# Patient Record
Sex: Female | Born: 1975
Health system: Southern US, Community
[De-identification: ages and names within clinical notes are randomized; demographics above are authoritative.]

## PROBLEM LIST (undated history)

## (undated) DIAGNOSIS — J029 Acute pharyngitis, unspecified: Secondary | ICD-10-CM

## (undated) DIAGNOSIS — E119 Type 2 diabetes mellitus without complications: Secondary | ICD-10-CM

## (undated) DIAGNOSIS — K219 Gastro-esophageal reflux disease without esophagitis: Secondary | ICD-10-CM

## (undated) DIAGNOSIS — D649 Anemia, unspecified: Secondary | ICD-10-CM

## (undated) DIAGNOSIS — J45909 Unspecified asthma, uncomplicated: Secondary | ICD-10-CM

## (undated) DIAGNOSIS — E042 Nontoxic multinodular goiter: Secondary | ICD-10-CM

## (undated) DIAGNOSIS — R609 Edema, unspecified: Principal | ICD-10-CM

## (undated) DIAGNOSIS — J309 Allergic rhinitis, unspecified: Secondary | ICD-10-CM

## (undated) DIAGNOSIS — M76899 Other specified enthesopathies of unspecified lower limb, excluding foot: Secondary | ICD-10-CM

## (undated) DIAGNOSIS — J039 Acute tonsillitis, unspecified: Secondary | ICD-10-CM

## (undated) DIAGNOSIS — I10 Essential (primary) hypertension: Secondary | ICD-10-CM

## (undated) DIAGNOSIS — L680 Hirsutism: Secondary | ICD-10-CM

## (undated) DIAGNOSIS — R002 Palpitations: Secondary | ICD-10-CM

## (undated) DIAGNOSIS — F419 Anxiety disorder, unspecified: Secondary | ICD-10-CM

## (undated) HISTORY — DX: Unspecified asthma, uncomplicated: J45.909

## (undated) HISTORY — DX: Edema, unspecified: R60.9

## (undated) HISTORY — DX: Nontoxic multinodular goiter: E04.2

## (undated) HISTORY — DX: Anemia, unspecified: D64.9

## (undated) HISTORY — DX: Anxiety disorder, unspecified: F41.9

## (undated) HISTORY — DX: Type 2 diabetes mellitus without complications: E11.9

## (undated) HISTORY — DX: Palpitations: R00.2

## (undated) HISTORY — DX: Essential (primary) hypertension: I10

## (undated) HISTORY — DX: Other specified enthesopathies of unspecified lower limb, excluding foot: M76.899

## (undated) HISTORY — DX: Acute tonsillitis, unspecified: J03.90

## (undated) HISTORY — DX: Allergic rhinitis, unspecified: J30.9

## (undated) HISTORY — PX: OTHER SURGICAL HISTORY: SHX169

## (undated) HISTORY — DX: Acute pharyngitis, unspecified: J02.9

## (undated) HISTORY — DX: Hirsutism: L68.0

---

## 2003-12-06 ENCOUNTER — Emergency Department (HOSPITAL_COMMUNITY): Admission: EM | Admit: 2003-12-06 | Discharge: 2003-12-06 | Payer: Self-pay | Admitting: Family Medicine

## 2004-05-28 HISTORY — PX: OTHER SURGICAL HISTORY: SHX169

## 2005-06-15 ENCOUNTER — Ambulatory Visit (HOSPITAL_COMMUNITY): Admission: RE | Admit: 2005-06-15 | Discharge: 2005-06-15 | Payer: Self-pay | Admitting: Obstetrics and Gynecology

## 2006-02-22 ENCOUNTER — Inpatient Hospital Stay (HOSPITAL_COMMUNITY): Admission: AD | Admit: 2006-02-22 | Discharge: 2006-02-22 | Payer: Self-pay | Admitting: Obstetrics and Gynecology

## 2006-02-27 ENCOUNTER — Inpatient Hospital Stay (HOSPITAL_COMMUNITY): Admission: AD | Admit: 2006-02-27 | Discharge: 2006-03-02 | Payer: Self-pay | Admitting: *Deleted

## 2006-03-01 ENCOUNTER — Encounter (INDEPENDENT_AMBULATORY_CARE_PROVIDER_SITE_OTHER): Payer: Self-pay | Admitting: Specialist

## 2006-03-05 ENCOUNTER — Inpatient Hospital Stay (HOSPITAL_COMMUNITY): Admission: AD | Admit: 2006-03-05 | Discharge: 2006-03-08 | Payer: Self-pay | Admitting: Obstetrics and Gynecology

## 2006-09-26 LAB — CONVERTED CEMR LAB: Pap Smear: NORMAL

## 2006-12-19 ENCOUNTER — Encounter: Admission: RE | Admit: 2006-12-19 | Discharge: 2006-12-19 | Payer: Self-pay | Admitting: Internal Medicine

## 2007-09-26 LAB — CONVERTED CEMR LAB: Pap Smear: NORMAL

## 2008-05-28 LAB — HM MAMMOGRAPHY

## 2008-06-01 ENCOUNTER — Ambulatory Visit: Payer: Self-pay | Admitting: Internal Medicine

## 2008-06-01 DIAGNOSIS — J45909 Unspecified asthma, uncomplicated: Secondary | ICD-10-CM

## 2008-06-01 DIAGNOSIS — J309 Allergic rhinitis, unspecified: Secondary | ICD-10-CM

## 2008-06-01 DIAGNOSIS — I10 Essential (primary) hypertension: Secondary | ICD-10-CM

## 2008-06-01 HISTORY — DX: Unspecified asthma, uncomplicated: J45.909

## 2008-06-01 HISTORY — DX: Essential (primary) hypertension: I10

## 2008-06-01 HISTORY — DX: Allergic rhinitis, unspecified: J30.9

## 2008-06-01 LAB — CONVERTED CEMR LAB
ALT: 20 units/L (ref 0–35)
AST: 20 units/L (ref 0–37)
Alkaline Phosphatase: 54 units/L (ref 39–117)
BUN: 8 mg/dL (ref 6–23)
Basophils Absolute: 0.2 10*3/uL — ABNORMAL HIGH (ref 0.0–0.1)
Bilirubin Urine: NEGATIVE
Bilirubin, Direct: 0.1 mg/dL (ref 0.0–0.3)
CO2: 28 meq/L (ref 19–32)
Glucose, Bld: 101 mg/dL — ABNORMAL HIGH (ref 70–99)
HDL: 50.9 mg/dL (ref >39.0)
Hemoglobin: 14.4 g/dL (ref 12.0–15.0)
Ketones, ur: NEGATIVE mg/dL
LDL Cholesterol: 97 mg/dL (ref 0–99)
Leukocytes, UA: NEGATIVE
Lymphocytes Relative: 27.2 % (ref 12.0–46.0)
MCHC: 35.7 g/dL (ref 30.0–36.0)
Monocytes Absolute: 0.4 10*3/uL (ref 0.1–1.0)
Neutro Abs: 3.2 10*3/uL (ref 1.4–7.7)
Platelets: 207 10*3/uL (ref 150–400)
Potassium: 4.1 meq/L (ref 3.5–5.1)
RDW: 12 % (ref 11.5–14.6)
Sodium: 139 meq/L (ref 135–145)
Total CHOL/HDL Ratio: 3.4
Triglycerides: 124 mg/dL (ref 0–149)
VLDL: 25 mg/dL (ref 0–40)
pH: 8 (ref 5.0–8.0)

## 2008-06-09 ENCOUNTER — Encounter: Admission: RE | Admit: 2008-06-09 | Discharge: 2008-06-09 | Payer: Self-pay | Admitting: Internal Medicine

## 2008-06-28 ENCOUNTER — Ambulatory Visit: Payer: Self-pay | Admitting: Endocrinology

## 2008-06-28 DIAGNOSIS — E042 Nontoxic multinodular goiter: Secondary | ICD-10-CM

## 2008-06-28 HISTORY — DX: Nontoxic multinodular goiter: E04.2

## 2008-07-21 ENCOUNTER — Ambulatory Visit (HOSPITAL_COMMUNITY): Admission: RE | Admit: 2008-07-21 | Discharge: 2008-07-21 | Payer: Self-pay | Admitting: Obstetrics and Gynecology

## 2008-09-22 ENCOUNTER — Ambulatory Visit: Payer: Self-pay | Admitting: Internal Medicine

## 2008-09-22 LAB — CONVERTED CEMR LAB: TSH: 0.6 microintl units/mL (ref 0.35–5.50)

## 2008-10-01 ENCOUNTER — Ambulatory Visit: Payer: Self-pay | Admitting: Endocrinology

## 2008-10-01 DIAGNOSIS — H00039 Abscess of eyelid unspecified eye, unspecified eyelid: Secondary | ICD-10-CM | POA: Insufficient documentation

## 2008-10-19 ENCOUNTER — Telehealth (INDEPENDENT_AMBULATORY_CARE_PROVIDER_SITE_OTHER): Payer: Self-pay | Admitting: *Deleted

## 2008-10-26 ENCOUNTER — Ambulatory Visit: Payer: Self-pay | Admitting: Internal Medicine

## 2008-12-08 ENCOUNTER — Telehealth: Payer: Self-pay | Admitting: Internal Medicine

## 2009-03-01 ENCOUNTER — Ambulatory Visit: Payer: Self-pay | Admitting: Internal Medicine

## 2009-03-03 ENCOUNTER — Encounter (INDEPENDENT_AMBULATORY_CARE_PROVIDER_SITE_OTHER): Payer: Self-pay | Admitting: *Deleted

## 2009-03-09 ENCOUNTER — Ambulatory Visit: Payer: Self-pay | Admitting: Internal Medicine

## 2009-03-25 ENCOUNTER — Encounter (INDEPENDENT_AMBULATORY_CARE_PROVIDER_SITE_OTHER): Payer: Self-pay | Admitting: *Deleted

## 2009-05-05 ENCOUNTER — Ambulatory Visit: Payer: Self-pay | Admitting: Internal Medicine

## 2009-05-05 DIAGNOSIS — J039 Acute tonsillitis, unspecified: Secondary | ICD-10-CM | POA: Insufficient documentation

## 2009-05-05 HISTORY — DX: Acute tonsillitis, unspecified: J03.90

## 2009-05-16 ENCOUNTER — Telehealth (INDEPENDENT_AMBULATORY_CARE_PROVIDER_SITE_OTHER): Payer: Self-pay | Admitting: *Deleted

## 2009-06-02 ENCOUNTER — Ambulatory Visit: Payer: Self-pay | Admitting: Endocrinology

## 2009-06-02 ENCOUNTER — Ambulatory Visit: Payer: Self-pay | Admitting: Internal Medicine

## 2009-06-02 DIAGNOSIS — L680 Hirsutism: Secondary | ICD-10-CM

## 2009-06-02 HISTORY — DX: Hirsutism: L68.0

## 2009-06-15 ENCOUNTER — Encounter: Admission: RE | Admit: 2009-06-15 | Discharge: 2009-06-15 | Payer: Self-pay | Admitting: Endocrinology

## 2009-06-16 LAB — CONVERTED CEMR LAB
Albumin: 3.7 g/dL (ref 3.5–5.2)
Basophils Absolute: 0 10*3/uL (ref 0.0–0.1)
CO2: 28 meq/L (ref 19–32)
Eosinophils Absolute: 0.2 10*3/uL (ref 0.0–0.7)
Glucose, Bld: 97 mg/dL (ref 70–99)
HCT: 40.3 % (ref 36.0–46.0)
HDL: 45.9 mg/dL (ref >39.00)
Hemoglobin: 13.6 g/dL (ref 12.0–15.0)
Ketones, ur: NEGATIVE mg/dL
Leukocytes, UA: NEGATIVE
Lymphs Abs: 1.4 10*3/uL (ref 0.7–4.0)
MCHC: 33.7 g/dL (ref 30.0–36.0)
Neutro Abs: 2.9 10*3/uL (ref 1.4–7.7)
Potassium: 4.3 meq/L (ref 3.5–5.1)
RDW: 12.2 % (ref 11.5–14.6)
Sodium: 140 meq/L (ref 135–145)
Specific Gravity, Urine: 1.025 (ref 1.000–1.030)
TSH: 1.17 microintl units/mL (ref 0.35–5.50)
Urobilinogen, UA: 0.2 (ref 0.0–1.0)

## 2009-06-22 ENCOUNTER — Telehealth: Payer: Self-pay | Admitting: Endocrinology

## 2009-06-28 ENCOUNTER — Encounter: Admission: RE | Admit: 2009-06-28 | Discharge: 2009-06-28 | Payer: Self-pay | Admitting: Endocrinology

## 2009-06-30 ENCOUNTER — Telehealth: Payer: Self-pay | Admitting: Internal Medicine

## 2009-07-01 ENCOUNTER — Ambulatory Visit: Payer: Self-pay | Admitting: Internal Medicine

## 2009-07-01 DIAGNOSIS — J069 Acute upper respiratory infection, unspecified: Secondary | ICD-10-CM | POA: Insufficient documentation

## 2009-07-01 DIAGNOSIS — R519 Headache, unspecified: Secondary | ICD-10-CM | POA: Insufficient documentation

## 2009-07-01 DIAGNOSIS — R51 Headache: Secondary | ICD-10-CM

## 2009-07-01 LAB — CONVERTED CEMR LAB
Chloride: 105 meq/L (ref 96–112)
Creatinine, Ser: 0.7 mg/dL (ref 0.4–1.2)

## 2009-08-04 ENCOUNTER — Telehealth: Payer: Self-pay | Admitting: Internal Medicine

## 2010-04-28 ENCOUNTER — Ambulatory Visit: Payer: Self-pay | Admitting: Internal Medicine

## 2010-04-28 DIAGNOSIS — R609 Edema, unspecified: Secondary | ICD-10-CM

## 2010-04-28 DIAGNOSIS — M7660 Achilles tendinitis, unspecified leg: Secondary | ICD-10-CM

## 2010-04-28 DIAGNOSIS — M76899 Other specified enthesopathies of unspecified lower limb, excluding foot: Secondary | ICD-10-CM

## 2010-04-28 DIAGNOSIS — R252 Cramp and spasm: Secondary | ICD-10-CM | POA: Insufficient documentation

## 2010-04-28 HISTORY — DX: Edema, unspecified: R60.9

## 2010-04-28 HISTORY — DX: Other specified enthesopathies of unspecified lower limb, excluding foot: M76.899

## 2010-05-04 ENCOUNTER — Ambulatory Visit: Payer: Self-pay

## 2010-05-19 ENCOUNTER — Ambulatory Visit (HOSPITAL_COMMUNITY)
Admission: RE | Admit: 2010-05-19 | Discharge: 2010-05-19 | Payer: Self-pay | Source: Home / Self Care | Attending: Internal Medicine | Admitting: Internal Medicine

## 2010-05-19 ENCOUNTER — Encounter: Payer: Self-pay | Admitting: Internal Medicine

## 2010-05-26 ENCOUNTER — Ambulatory Visit
Admission: RE | Admit: 2010-05-26 | Discharge: 2010-05-26 | Payer: Self-pay | Source: Home / Self Care | Attending: Orthopedic Surgery | Admitting: Orthopedic Surgery

## 2010-05-26 ENCOUNTER — Encounter (INDEPENDENT_AMBULATORY_CARE_PROVIDER_SITE_OTHER): Payer: Self-pay | Admitting: Orthopedic Surgery

## 2010-05-26 ENCOUNTER — Encounter: Payer: Self-pay | Admitting: Internal Medicine

## 2010-06-28 NOTE — Progress Notes (Signed)
Summary: pt?  Phone Note Call from Patient Call back at Home Phone [phone]   Caller: Patient [phone] Summary of Call: pt called requesting MD to call her to discuss results of [place] and follow up biopsy. pt says that she did receive message left by MD Sunday but she has a few question and will appreciate a call back. I advised pt that she may want to set up appt to discuss with MD but she refused, stating "she does not want to pay another co-pay". please advise Initial call taken by: Margaret Pyle, CMA,  June 22, 2009 11:14 AM  Follow-up for Phone Call        i called pt 06/22/08.  we discussed the rationale behind bx.  pt agrees. Follow-up by: Minus Breeding MD,  June 22, 2009 2:36 PM

## 2010-06-28 NOTE — Progress Notes (Signed)
Summary: Side Effect  Phone Note Call from Patient   Caller: Patient [phone] Summary of Call: pt called stating that she beleives that Spironilactone is causing her to have the sensation of burning, fluttering and pressure to her LT temple. pt did not take medication today and will not take until advised by MD Initial call taken by: Margaret Pyle, CMA,  June 30, 2009 3:49 PM  Follow-up for Phone Call        I very much doubt the water pill can do this, and have never heard of this problem with other pts - ok to take the pill, and may need to consider another cause Follow-up by: Corwin Levins MD,  June 30, 2009 4:14 PM  Additional Follow-up for Phone Call Additional follow up Details #1::        called pt left msg to call back Additional Follow-up by: Scharlene Gloss,  June 30, 2009 4:30 PM    Additional Follow-up for Phone Call Additional follow up Details #2::    called pt informed of information. Patient said she would call later and schedule appt with Dr. Jonny Ruiz if symptons continue. Patient also requested that contact phone number be changed to [phone]. Follow-up by: Scharlene Gloss,  July 01, 2009 8:11 AM

## 2010-06-28 NOTE — Assessment & Plan Note (Signed)
Summary: muscle spasm leg/#/cd   Vital Signs:  Patient profile:   35 year old female Height:      61 inches Weight:      198.25 pounds BMI:     37.59 O2 Sat:      97 % on Room air Temp:     97.3 degrees F oral Pulse rate:   92 / minute BP sitting:   112 / 82  (left arm) Cuff size:   large  Vitals Entered By: Zella Ball Ewing CMA (AAMA) (April 28, 2010 11:12 AM)  O2 Flow:  Room air CC: Right leg muscle spasm, left hip pressure/RE   CC:  Right leg muscle spasm and left hip pressure/RE.  History of Present Illness: here with acute visit - 4 days ago had one episode distal RLE  leg/calf  spasmsjust before going to bed, recurring for 3  hrs intermittent, since then with some discomfort with walking each day;  no swelling, pain mild now, located more medial aspect of the calf;  no radiation;  not worse or better with flexion/extension of the foot;  no LBP, no bowel or bladder changes, or LE pain/weak/numb; gait change or fall, but has had pressure/dull ache to left lateral hip area> 4 days  worse to lie on the left side at night (also no radiaiton);  stoill concerned about the sweling to the feet/ankles despite lower salt diet and avoiding fast food, and elevation.  Pt denies CP, worsening sob, doe, wheezing, orthopnea, pnd, worsening LE edema, palps, dizziness or syncope  Pt denies new neuro symptoms such as headache, facial or extremity weakness  RIght ankle more swollen  than left usually.  Not taking the spironolactone as it seemed to cause headache, only doing the HCTZ (but she is not sure of the dose).  Last echo approx 2 yrs?  No fever, wt loss, night sweats, loss of appetite or other constitutional symptoms  Denies worsening depressive symptoms, suicidal ideation, or panic.  , though has ongoing anxiety.  Preventive Screening-Counseling & Management      Drug Use:  no.    Problems Prior to Update: 1)  Peripheral Edema  (ICD-782.3) 2)  Muscle Cramps  (ICD-729.82) 3)  Bursitis, Left  Hip  (ICD-726.5) 4)  Achilles Tendinitis, Mild  (ICD-726.71) 5)  Uri  (ICD-465.9) 6)  Headache  (ICD-784.0) 7)  Hirsutism  (ICD-704.1) 8)  Tonsillitis, Recurrent  (ICD-463) 9)  Cellulitis, Eyelid  (ICD-373.13) 10)  Goiter, Multinodular  (ICD-241.1) 11)  Preventive Health Care  (ICD-V70.0) 12)  Allergic Rhinitis  (ICD-477.9) 13)  Hypertension  (ICD-401.9) 14)  Asthma  (ICD-493.90)  Medications Prior to Update: 1)  Prenatal Plus/iron 27-1 Mg Tabs (Prenatal Vit-Fe Fumarate-Fa) .Marland Kitchen.. 1 By Mouth Daily 2)  Labetalol Hcl 100 Mg Tabs (Labetalol Hcl) .Marland Kitchen.. 1po Two Times A Day 3)  Vaniqa 13.9 % Crea (Eflornithine Hcl) .... Qhs 4)  Spironolactone-Hctz 25-25 Mg Tabs (Spironolactone-Hctz) .... 1/2 Qd 5)  Cephalexin 500 Mg Caps (Cephalexin) .Marland Kitchen.. 1 By Mouth Three Times A Day 6)  Diazepam 5 Mg Tabs (Diazepam) .Marland Kitchen.. 1po Two Times A Day As Needed 7)  Proventil Hfa 108 (90 Base) Mcg/act Aers (Albuterol Sulfate) .... 2 Puffs Qid As Needed  Current Medications (verified): 1)  Prenatal Plus/iron 27-1 Mg Tabs (Prenatal Vit-Fe Fumarate-Fa) .Marland Kitchen.. 1 By Mouth Daily 2)  Labetalol Hcl 100 Mg Tabs (Labetalol Hcl) .Marland Kitchen.. 1po Two Times A Day 3)  Proventil Hfa 108 (90 Base) Mcg/act Aers (Albuterol Sulfate) .... 2 Puffs Qid  As Needed 4)  Furosemide 40 Mg Tabs (Furosemide) .... 1/2 -1 By Mouth Once Daily As Needed 5)  Klor-Con 10 10 Meq Cr-Tabs (Potassium Chloride) .Marland Kitchen.. 1po Once Daily As Needed Lasix 6)  Naproxen 500 Mg Tabs (Naproxen) .Marland Kitchen.. 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  , not ill appearing Head:  normocephalic and atraumatic.   Eyes:  vision grossly intact, pupils equal, and pupils round.   Ears:  R ear normal and L ear normal.   Nose:  no external deformity and no nasal discharge.   Mouth:  no gingival abnormalities and pharynx pink and moist.   Neck:  supple and no masses including LA Lungs:  normal respiratory effort and normal breath sounds.   Heart:  normal rate and regular rhythm.   Msk:  tender over left lateral greater trochanter moderate to severe,  also tender right medial calf and achilles without swelling except for below Extremities:  trace to 1+ bialt LE edema to ankles right > left  Neurologic:  strength normal in all extremities and gait normal.   Psych:  not depressed appearing and moderately anxious.     Impression & Recommendations:  Problem # 1:  ACHILLES TENDINITIS, MILD (ICD-726.71) right sided, midl to mod - for naproxen two times a day as needed   Problem # 2:  BURSITIS, LEFT HIP (ICD-726.5)  moderate to severe , treat as above, f/u any worsening signs or symptoms , also refer ortho per pt request  Orders: Orthopedic Surgeon Referral (Ortho Surgeon)  Problem # 3:  MUSCLE CRAMPS (ICD-729.82) most likely related to the achilles tendonitis - declines muscle relaxer as needed   Problem # 4:  PERIPHERAL EDEMA (ICD-782.3)  The following medications were removed from the medication list:    Spironolactone-hctz 25-25 Mg Tabs (Spironolactone-hctz) .Marland Kitchen... 1/2 qd Her updated medication list for this problem includes:    Furosemide 40 Mg Tabs (Furosemide) .Marland Kitchen... 1/2 -1 by mouth once daily as needed to change to laxis/ K as  needed ; I suspect related to venous insuff ; pt requests echo; will defer ecg and labs to next visit  Orders: Echo Referral (Echo)  Complete Medication List: 1)  Prenatal Plus/iron 27-1 Mg Tabs (Prenatal vit-fe fumarate-fa) .Marland Kitchen.. 1 by mouth daily 2)  Labetalol Hcl 100 Mg Tabs (Labetalol hcl) .Marland Kitchen.. 1po two times a day 3)  Proventil Hfa 108 (90 Base) Mcg/act Aers (Albuterol sulfate) .... 2 puffs qid as needed 4)  Furosemide 40 Mg Tabs (Furosemide) .... 1/2 -1 by mouth once daily as needed 5)  Klor-con 10 10 Meq Cr-tabs (Potassium chloride) .Marland Kitchen.. 1po once daily as needed lasix 6)  Naproxen 500 Mg Tabs (Naproxen) .Marland Kitchen.. 1po two times a day as needed  Other Orders: Admin 1st Vaccine (13086) Flu Vaccine 33yrs + (  52841)  Patient Instructions: 1)  you had the flu shot today 2)  You will be contacted about the referral(s) to: orthopedic, and echocardogram 3)  stop the spironolactone/HCT 4)  start the furosemide 40 mg - 1/2 - 1 once daily as needed 5)  (both were also sent to [facility]) 6)  start the potassium pill on the days you might take the lasix 7)  start the naproxen 500 mg two times a day as needed (not sent to [facility]) 8)  Please schedule a follow-up appointment in 1 month with CPX and labs Prescriptions: NAPROXEN 500 MG TABS (NAPROXEN) 1po two times a day as needed  #60 x 1   Entered and Authorized by:   Corwin Levins MD   Signed by:   Corwin Levins MD on 04/28/2010   Method used:   Print then Give to Patient   RxID:   [id number] KLOR-CON 10 10 MEQ CR-TABS (POTASSIUM CHLORIDE) 1po once daily as needed lasix  #30 x 0   Entered and Authorized by:   Corwin Levins MD   Signed by:   Corwin Levins MD on 04/28/2010   Method used:   Print then Give to Patient   RxID:   [id number] KLOR-CON 10 10 MEQ CR-TABS (POTASSIUM CHLORIDE) 1po once daily as needed lasix  #90 x 3   Entered and Authorized by:   Corwin Levins MD   Signed by:   Corwin Levins MD on 04/28/2010   Method used:   Faxed to ...        [facility] (mail-order)             , [place]         Ph: [phone]       Fax: [phone]   RxID:   [id number] FUROSEMIDE 40 MG TABS (FUROSEMIDE) 1/2 -1 by mouth once daily as needed  #30 x 0   Entered and Authorized by:   Corwin Levins MD   Signed by:   Corwin Levins MD on 04/28/2010   Method used:   Print then Give to Patient   RxID:   [id number] FUROSEMIDE 40 MG TABS (FUROSEMIDE) 1/2 -1 by mouth once daily as needed  #90 x 3   Entered and Authorized by:   Corwin Levins MD   Signed by:   Corwin Levins MD on 04/28/2010   Method used:   Faxed to ...       [facility] (mail-order)             , [place]         Ph: [phone]       Fax: [phone]   RxID:   [id number]    Orders Added: 1)  Admin 1st Vaccine [90471] 2)  Flu Vaccine 23yrs + [67893] 3)  Orthopedic Surgeon Referral [Ortho Surgeon] 4)  Echo Referral [Echo] 5)  Est. Patient Level IV [81017]   Flu Vaccine Consent Questions     Do you have a history of severe allergic reactions to this vaccine? no    Any prior history of allergic reactions to egg and/or gelatin? no    Do you have a sensitivity to the preservative Thimersol? no    Do you have a past history of Guillan-Barre Syndrome? no    Do you currently have an acute febrile illness? no    Have you ever had a severe reaction to latex? no    Vaccine information given and explained  to patient? yes    Are you currently pregnant? no    Lot Number:[id number]   Exp Date:11/25/2010   Site Given  Left Deltoid [id number]

## 2010-06-28 NOTE — Assessment & Plan Note (Signed)
Summary: TEMPLE PAIN /NWS  #   Vital Signs:  Patient profile:   35 year old female Height:      61 inches Weight:      194 pounds BMI:     36.79 O2 Sat:      94 % on Room air Temp:     98.4 degrees F oral Pulse rate:   85 / minute BP sitting:   118 / 80  (left arm) Cuff size:   regular  Vitals Entered ByZella Ball Ewing (July 01, 2009 2:39 PM)  O2 Flow:  Room air  CC: temple pain/RE   CC:  temple pain/RE.  History of Present Illness: here with 5 days left temple coolness, burning sensation and "fluttering" feeling near the more lateral upper left eyelid and temple in somewhat nondiscreet area;  none on the right; no sweling, redness or tender of the area, or new sinus congestion, no fever, no injury; no  vision change ;  no rash such as shingles, but also with some discomfort off and on to the left ear; Pt denies new neuro symptoms such as other headache, facial or extremity weakness .  Pt denies CP, sob, doe, wheezing, orthopnea, pnd, worsening LE edema, palps, dizziness or syncope  Symptoms seem to correllate with starting at the same time as the new spironolactone/hctz.  No prior hx or neuromuscular , electrolyte, CNS illness.   Problems Prior to Update: 1)  Uri  (ICD-465.9) 2)  Headache  (ICD-784.0) 3)  Hirsutism  (ICD-704.1) 4)  Tonsillitis, Recurrent  (ICD-463) 5)  Cellulitis, Eyelid  (ICD-373.13) 6)  Goiter, Multinodular  (ICD-241.1) 7)  Preventive Health Care  (ICD-V70.0) 8)  Allergic Rhinitis  (ICD-477.9) 9)  Hypertension  (ICD-401.9) 10)  Asthma  (ICD-493.90)  Medications Prior to Update: 1)  Prenatal Plus/iron 27-1 Mg Tabs (Prenatal Vit-Fe Fumarate-Fa) .Marland Kitchen.. 1 By Mouth Daily 2)  Labetalol Hcl 100 Mg Tabs (Labetalol Hcl) .Marland Kitchen.. 1po Two Times A Day 3)  Vaniqa 13.9 % Crea (Eflornithine Hcl) .... Qhs 4)  Spironolactone-Hctz 25-25 Mg Tabs (Spironolactone-Hctz) .... 1/2 Qd  Current Medications (verified): 1)  Prenatal Plus/iron 27-1 Mg Tabs (Prenatal Vit-Fe  Fumarate-Fa) .Marland Kitchen.. 1 By Mouth Daily 2)  Labetalol Hcl 100 Mg Tabs (Labetalol Hcl) .Marland Kitchen.. 1po Two Times A Day 3)  Vaniqa 13.9 % Crea (Eflornithine Hcl) .... Qhs 4)  Spironolactone-Hctz 25-25 Mg Tabs (Spironolactone-Hctz) .... 1/2 Qd 5)  Cephalexin 500 Mg Caps (Cephalexin) .Marland Kitchen.. 1 By Mouth Three Times A Day 6)  Diazepam 5 Mg Tabs (Diazepam) .Marland Kitchen.. 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  , not ill appearing Head:  normocephalic and atraumatic.   Eyes:  vision grossly intact, pupils equal, and pupils round.   Ears:  bilat tm's erythema left more than right, canals ok, no d/c Nose:  no external deformity and no nasal discharge.   Mouth:  pharyngeal erythema and  fair dentition.  , tonsils still 1+  Neck:  supple and no masses including LA Lungs:  normal respiratory effort and normal breath sounds.   Heart:  normal rate and regular rhythm.   Abdomen:  soft, non-tender, and normal bowel sounds.  ,no organomegaly  Msk:  no joint tenderness and no joint swelling.   Extremities:  no edema, no erythema  Neurologic:  alert & oriented X3, cranial nerves II-XII intact, and strength normal in all extremities.  , left upper lateral eyelid and area just above adn left temple without visible twitches, swelling, erythema, tender, or rash Psych:  severely  anxious.     Impression & Recommendations:  Problem # 1:  HEADACHE (ICD-784.0)  Her updated medication list for this problem includes:    Labetalol Hcl 100 Mg Tabs (Labetalol hcl) .Marland Kitchen... 1po two times a day neuritic type it seems by hx but no rash;  doubt related to meds but will chekc labs as below;  try valium limited rx for prob fasciculations and anxiety, suspect likely related to referred pain from ear involvement with the URI  Orders: TLB-BMP (Basic Metabolic Panel-BMET) (80048-METABOL) TLB-Magnesium (Mg) (83735-MG)  Problem # 2:  TONSILLITIS, RECURRENT (ICD-463)  still not seen  ENT as per last visit was intended, she states simply no one ever called her  Orders: ENT Referral (ENT)  Problem # 3:  URI (ICD-465.9) minor symptoms, but clear on exam, seems likely related to above; will try cephalexin course and mucinex course  Problem # 4:  HYPERTENSION (ICD-401.9)  Her updated medication list for this problem includes:    Labetalol Hcl 100 Mg Tabs (Labetalol hcl) .Marland Kitchen... 1po two times a day    Spironolactone-hctz 25-25 Mg Tabs (Spironolactone-hctz) .Marland Kitchen... 1/2 qd  BP today: 118/80 Prior BP: 118/84 (06/02/2009)  Labs Reviewed: K+: 4.3 (06/02/2009) Creat: : 0.8 (06/02/2009)   Chol: 180 (06/02/2009)   HDL: 45.90 (06/02/2009)   LDL: 113 (06/02/2009)   TG: 104.0 (06/02/2009) stable overall by hx and exam, ok to continue meds/tx as is including the new med, will check lytes  Complete Medication List: 1)  Prenatal Plus/iron 27-1 Mg Tabs (Prenatal vit-fe fumarate-fa) .Marland Kitchen.. 1 by mouth daily 2)  Labetalol Hcl 100 Mg Tabs (Labetalol hcl) .Marland Kitchen.. 1po two times a day 3)  Vaniqa 13.9 % Crea (Eflornithine hcl) .... Qhs 4)  Spironolactone-hctz 25-25 Mg Tabs (Spironolactone-hctz) .... 1/2 qd 5)  Cephalexin 500 Mg Caps (Cephalexin) .Marland Kitchen.. 1 by mouth three times a day 6)  Diazepam 5 Mg Tabs (Diazepam) .Marland Kitchen.. 1po two times a day as needed  Patient Instructions: 1)  please the PCC's before leaving  about the ENT referral 2)  Please take all new medications as prescribed 3)  Continue all previous medications as before this visit  4)  call in 10 days to 2 wks if symptoms no better for possible neurology referral Prescriptions: DIAZEPAM 5 MG TABS (DIAZEPAM) 1po two times a day as needed  #30 x 1   Entered and Authorized by:   Corwin Levins MD   Signed by:   Corwin Levins MD on 07/01/2009   Method used:   Print then Give to Patient   RxID:   [id number] CEPHALEXIN 500 MG CAPS (CEPHALEXIN) 1 by mouth three times a day  #30 x 0   Entered and Authorized by:   Corwin Levins MD   Signed by:   Corwin Levins MD on 07/01/2009   Method used:   Print then Give  to Patient   RxID:   [id number]

## 2010-06-28 NOTE — Miscellaneous (Signed)
Summary: Appointment Canceled  Appointment status changed to canceled by [employer] on 05/03/2010 3:23 PM.  Cancellation Comments --------------------- [facility] EDEMA  Appointment Information ----------------------- Appt Type:  CARDIOLOGY ANCILLARY VISIT      Date:  Thursday, May 04, 2010      Time:  1:00 PM for 60 min   Urgency:  Routine   Made By:  Pearson Grippe  To Visit:  [id number]    Reason:  ECHO/PERIPHERAL EDEMA  Appt Comments ------------- -- 05/03/10 15:23: ([facility]) CANCELED -- ECHO/PERIPHERAL EDEMA -- 05/03/10 9:23: ([facility]) BOOKED -- Routine CARDIOLOGY ANCILLARY VISIT at 05/04/2010 1:00 PM for 60 min ECHO/PERIPHERAL EDEMA

## 2010-06-28 NOTE — Progress Notes (Signed)
  Phone Note Call from Patient Call back at Home Phone [phone]   Caller: Patient [phone] Summary of Call: pt called requesting RX for proventil inhaler. pt says it was originally RX'd by OB/GYN but pt would prefer all prescriptions to come from same MD. Initial call taken by: Margaret Pyle, CMA,  August 04, 2009 3:30 PM  Follow-up for Phone Call        ok - to robin to handle  2 puffs qid as needed  Follow-up by: Corwin Levins MD,  August 04, 2009 3:36 PM    New/Updated Medications: PROVENTIL HFA 108 (90 BASE) MCG/ACT AERS (ALBUTEROL SULFATE) 2 puffs qid as needed Prescriptions: PROVENTIL HFA 108 (90 BASE) MCG/ACT AERS (ALBUTEROL SULFATE) 2 puffs qid as needed  #1 x 6   Entered by:   Scharlene Gloss   Authorized by:   Corwin Levins MD   Signed by:   Scharlene Gloss on 08/04/2009   Method used:   Faxed to ...       [facility] [phone]* (retail)       [address] [place], [place]  [address]       Ph: [phone]       Fax: [phone]   RxID:   [id number]

## 2010-06-28 NOTE — Assessment & Plan Note (Signed)
Summary: f/u appt/#?cd   Vital Signs:  Patient profile:   35 year old female Height:      60 inches (152.40 cm) Weight:      197 pounds (89.55 kg) O2 Sat:      98 % on Room air Temp:     97.1 degrees F (36.17 degrees C) oral Pulse rate:   94 / minute BP sitting:   118 / 84  (left arm) Cuff size:   large  Vitals Entered By: Josph Macho CMA (June 02, 2009 9:27 AM)  O2 Flow:  Room air CC: follow-up visit/ pt states she is done with the Azithromycin/ CF Is Patient Diabetic? No   CC:  follow-up visit/ pt states she is done with the Azithromycin/ CF.  History of Present Illness: pt says she notices a fullness at the anterior neck.   she has increasing facial hair.  she is now not considering another pregnancy, and says she uses effective contraception.  she has regular menses.   she takes hctz as rx'ed.  Current Medications (verified): 1)  Prenatal Plus/iron 27-1 Mg Tabs (Prenatal Vit-Fe Fumarate-Fa) .Marland Kitchen.. 1 By Mouth Daily 2)  Labetalol Hcl 100 Mg Tabs (Labetalol Hcl) .Marland Kitchen.. 1po Two Times A Day 3)  Hydrochlorothiazide 25 Mg Tabs (Hydrochlorothiazide) .Marland Kitchen.. 1 By Mouth Once Daily 4)  Azithromycin 250 Mg Tabs (Azithromycin) .... 2po Qd For 1 Day, Then 1po Qd For 4days, Then Stop  Allergies (verified): No Known Drug Allergies  Past History:  Past Medical History: Last updated: 06/28/2008 GOITER (ICD-240.9) PREVENTIVE HEALTH CARE (ICD-V70.0) ALLERGIC RHINITIS (ICD-477.9) HYPERTENSION (ICD-401.9) ASTHMA (ICD-493.90)  Review of Systems  The patient denies weight loss and weight gain.         denies neck pain  Physical Exam  General:  normal appearance.   Head:  i do not appreciate hirsutism on the face (pt removes the hair) Neck:  i do not appreciate a goiter   Impression & Recommendations:  Problem # 1:  HIRSUTISM (ICD-704.1) Assessment New  Problem # 2:  GOITER, MULTINODULAR (ICD-241.1)  Problem # 3:  HYPERTENSION (ICD-401.9) well-controlled  Medications  Added to Medication List This Visit: 1)  Vaniqa 13.9 % Crea (Eflornithine hcl) .... Qhs 2)  Spironolactone-hctz 25-25 Mg Tabs (Spironolactone-hctz) .... 1/2 qd  Other Orders: TLB-Testosterone, Total (84403-TESTO) Radiology Referral (Radiology) Est. Patient Level IV (74259)  Patient Instructions: 1)  tests are being ordered for you today.  a few days after the test(s), please call [phone] to hear your test results. 2)  continue vaniqa 3)  change hctz to aldactazide 25/25, 1/2 once daily. 4)  return 1 year. 5)  please note that you are on medications that are not safe during pregnancy.  you must stop these and let us know if you are at risk for pregnancy. 6)  check thyroid ultrasound. Prescriptions: SPIRONOLACTONE-HCTZ 25-25 MG TABS (SPIRONOLACTONE-HCTZ) 1/2 qd  #90 x 1   Entered and Authorized by:   Minus Breeding MD   Signed by:   Minus Breeding MD on 06/02/2009   Method used:   Electronically to        [facility]* (mail-order)             ,          Ph: [phone]       Fax: [phone]   RxID:   [id number]

## 2010-06-29 NOTE — Consult Note (Signed)
Summary: [facility]   Imported By: Lennie Odor 06/06/2010 11:08:04  _____________________________________________________________________  External Attachment:    Type:   Image     Comment:   External Document

## 2010-07-25 ENCOUNTER — Encounter: Payer: Self-pay | Admitting: Endocrinology

## 2010-07-25 ENCOUNTER — Other Ambulatory Visit: Payer: BC Managed Care – PPO

## 2010-07-25 ENCOUNTER — Ambulatory Visit (INDEPENDENT_AMBULATORY_CARE_PROVIDER_SITE_OTHER): Payer: BC Managed Care – PPO | Admitting: Endocrinology

## 2010-07-25 ENCOUNTER — Other Ambulatory Visit: Payer: Self-pay | Admitting: Endocrinology

## 2010-07-25 DIAGNOSIS — R002 Palpitations: Secondary | ICD-10-CM | POA: Insufficient documentation

## 2010-07-25 HISTORY — DX: Palpitations: R00.2

## 2010-07-25 LAB — CBC WITH DIFFERENTIAL/PLATELET
Basophils Absolute: 0 10*3/uL (ref 0.0–0.1)
Eosinophils Absolute: 0.1 10*3/uL (ref 0.0–0.7)
HCT: 39.5 % (ref 36.0–46.0)
Hemoglobin: 13.5 g/dL (ref 12.0–15.0)
Lymphs Abs: 1.9 10*3/uL (ref 0.7–4.0)
MCHC: 34.2 g/dL (ref 30.0–36.0)
Monocytes Absolute: 0.6 10*3/uL (ref 0.1–1.0)
Neutro Abs: 2.5 10*3/uL (ref 1.4–7.7)
RDW: 12.8 % (ref 11.5–14.6)

## 2010-07-25 LAB — BASIC METABOLIC PANEL
BUN: 15 mg/dL (ref 6–23)
Calcium: 9.2 mg/dL (ref 8.4–10.5)
Chloride: 102 mEq/L (ref 96–112)
Creatinine, Ser: 0.7 mg/dL (ref 0.4–1.2)

## 2010-07-26 ENCOUNTER — Telehealth: Payer: Self-pay | Admitting: Endocrinology

## 2010-07-31 ENCOUNTER — Encounter: Payer: Self-pay | Admitting: Internal Medicine

## 2010-07-31 ENCOUNTER — Ambulatory Visit (INDEPENDENT_AMBULATORY_CARE_PROVIDER_SITE_OTHER): Payer: BC Managed Care – PPO

## 2010-07-31 ENCOUNTER — Other Ambulatory Visit: Payer: Self-pay | Admitting: Endocrinology

## 2010-07-31 DIAGNOSIS — E042 Nontoxic multinodular goiter: Secondary | ICD-10-CM

## 2010-07-31 DIAGNOSIS — Z23 Encounter for immunization: Secondary | ICD-10-CM

## 2010-08-01 ENCOUNTER — Telehealth: Payer: Self-pay | Admitting: Internal Medicine

## 2010-08-01 ENCOUNTER — Ambulatory Visit
Admission: RE | Admit: 2010-08-01 | Discharge: 2010-08-01 | Disposition: A | Discharge status: Home / Self Care | Payer: BC Managed Care – PPO | Source: Ambulatory Visit | Attending: Endocrinology | Admitting: Endocrinology

## 2010-08-01 ENCOUNTER — Encounter: Payer: Self-pay | Admitting: Endocrinology

## 2010-08-01 DIAGNOSIS — E042 Nontoxic multinodular goiter: Secondary | ICD-10-CM

## 2010-08-02 ENCOUNTER — Encounter (INDEPENDENT_AMBULATORY_CARE_PROVIDER_SITE_OTHER): Payer: Self-pay | Admitting: *Deleted

## 2010-08-02 ENCOUNTER — Ambulatory Visit (INDEPENDENT_AMBULATORY_CARE_PROVIDER_SITE_OTHER): Payer: BC Managed Care – PPO | Admitting: Endocrinology

## 2010-08-02 ENCOUNTER — Encounter: Payer: Self-pay | Admitting: Internal Medicine

## 2010-08-02 ENCOUNTER — Other Ambulatory Visit: Payer: BC Managed Care – PPO

## 2010-08-02 ENCOUNTER — Encounter: Payer: Self-pay | Admitting: Endocrinology

## 2010-08-02 DIAGNOSIS — R002 Palpitations: Secondary | ICD-10-CM

## 2010-08-02 DIAGNOSIS — Z111 Encounter for screening for respiratory tuberculosis: Secondary | ICD-10-CM

## 2010-08-02 DIAGNOSIS — B019 Varicella without complication: Secondary | ICD-10-CM

## 2010-08-03 NOTE — Progress Notes (Signed)
Summary: med ?-Cardizem  Phone Note From Pharmacy   Caller: [facility]  [address] [phone]* Summary of Call: Pharmacy called regarding pt's Diltiazem rx. Pharmacy wants to know what brand name of Cardizem (CD or LA) you want to be rx'd for pt. Initial call taken by: Brenton Grills CMA Duncan Dull),  July 26, 2010 11:14 AM  Follow-up for Phone Call        any generic 24-hr cardiazem please Follow-up by: Minus Breeding MD,  July 26, 2010 11:37 AM  Additional Follow-up for Phone Call Additional follow up Details #1::        pharmacist informed of MD's advisement Additional Follow-up by: Brenton Grills CMA Duncan Dull),  July 26, 2010 11:55 AM

## 2010-08-03 NOTE — Progress Notes (Signed)
Summary: med change  Phone Note From Pharmacy   Caller: [facility]  [place] [phone]* Summary of Call: Pharmacy called to inform MD that Diltiazem is not available as written, only the 24hr cap are available. Pharmacy is requesting MD advise, is it okay to change medication, direction? Initial call taken by: Margaret Pyle, CMA,  July 26, 2010 10:38 AM  Follow-up for Phone Call        i have resent Follow-up by: Minus Breeding MD,  July 26, 2010 10:41 AM    New/Updated Medications: DILTIAZEM HCL CR 180 MG XR24H-CAP (DILTIAZEM HCL) 1 tab once daily Prescriptions: DILTIAZEM HCL CR 180 MG XR24H-CAP (DILTIAZEM HCL) 1 tab once daily  #30 x 11   Entered and Authorized by:   Minus Breeding MD   Signed by:   Minus Breeding MD on 07/26/2010   Method used:   Electronically to        [id number]* (retail)       [address] [place], [place]  [address]       Ph: [phone]       Fax: [phone]   RxID:   [id number]

## 2010-08-04 ENCOUNTER — Telehealth: Payer: Self-pay | Admitting: Internal Medicine

## 2010-08-08 LAB — CONVERTED CEMR LAB: Metaneph Total, Ur: 262 ug/24hr (ref 115–695)

## 2010-08-08 NOTE — Progress Notes (Signed)
Summary: Titer req  Phone Note Call from Patient   Caller: Patient [phone] Summary of Call: Pt called requesting Varicella titer be added to labs scheduled for tomorrow 08/02/2010. Initial call taken by: Margaret Pyle, CMA,  August 01, 2010 1:54 PM  Follow-up for Phone Call        ok  -  052.9 Follow-up by: Corwin Levins MD,  August 01, 2010 5:30 PM  Additional Follow-up for Phone Call Additional follow up Details #1::        Pt advised and is requesting lab appt to day for Varicella titer 052.9. Can you help get this scheduled?  Thanks Additional Follow-up by: Margaret Pyle, CMA,  August 02, 2010 8:15 AM    Additional Follow-up for Phone Call Additional follow up Details #2::    varicellal titer,052.9 added. Follow-up by: Daphane Shepherd,  August 02, 2010 8:40 AM

## 2010-08-08 NOTE — Assessment & Plan Note (Signed)
Summary: PER PT FU / NEEDS TB TEST ALSO/ [facility]   Vital Signs:  Patient profile:   35 year old female Height:      61 inches (154.94 cm) Weight:      182.25 pounds (82.84 kg) BMI:     34.56 O2 Sat:      96 % on Room air Temp:     99.0 degrees F (37.22 degrees C) oral Pulse rate:   90 / minute BP sitting:   136 / 82  (left arm) Cuff size:   large  Vitals Entered By: Brenton Grills CMA Duncan Dull) (August 02, 2010 10:01 AM)  O2 Flow:  Room air CC: Follow-up visit/TB test/aj Is Patient Diabetic? No   CC:  Follow-up visit/TB test/aj.  History of Present Illness: the status of at least 3 ongoing medical problems is addressed today: palpitations: pt says she takes her bp and pulse at home.  palpitations are improved.   htn: she says syst bp is still sometimes over 140, and heart rate is 88-120.  her diltiazem is 120 mg.  she tolerates meds well. goiter:  she does not notice it.  pt wants to know if she should have any further w/u  Current Medications (verified): 1)  Prenatal Plus/iron 27-1 Mg Tabs (Prenatal Vit-Fe Fumarate-Fa) .Marland Kitchen.. 1 By Mouth Daily 2)  Proventil Hfa 108 (90 Base) Mcg/act Aers (Albuterol Sulfate) .... 2 Puffs Qid As Needed 3)  Furosemide 40 Mg Tabs (Furosemide) .... 1/2 -1 By Mouth Once Daily As Needed 4)  Klor-Con 10 10 Meq Cr-Tabs (Potassium Chloride) .Marland Kitchen.. 1po Once Daily As Needed Lasix 5)  Diltiazem Hcl Cr 180 Mg Xr24h-Cap (Diltiazem Hcl) .Marland Kitchen.. 1 Tab Once Daily  Allergies (verified): No Known Drug Allergies  Past History:  Past Medical History: Last updated: 06/28/2008 GOITER (ICD-240.9) PREVENTIVE HEALTH CARE (ICD-V70.0) ALLERGIC RHINITIS (ICD-477.9) HYPERTENSION (ICD-401.9) ASTHMA (ICD-493.90)  Review of Systems  The patient denies syncope and dyspnea on exertion.    Physical Exam  General:  obese.  no distress. Lungs:  Clear to auscultation bilaterally. Normal respiratory effort.  Heart:  Regular rate and rhythm without murmurs or gallops noted. Normal  S1,S2.   Additional Exam:  we reviewed thyroid ultrasound report, adn i gave pt a copy   Impression & Recommendations:  Problem # 1:  PALPITATIONS (ICD-785.1) uncertain etiology  Problem # 2:  HYPERTENSION (ICD-401.9) needs increased rx  Problem # 3:  GOITER, MULTINODULAR (ICD-241.1) Assessment: Unchanged  Other Orders: TB Skin Test (16109) Admin 1st Vaccine (60454) Est. Patient Level IV (09811)  Patient Instructions: 1)  increase diltiazem to 180 mg once daily 2)  when the result of the 24-hr urine for "adrenaline" is available, i'll let you know on the "blue card" system. 3)  at the most, i would advise rechecking the ultrasound in 1 year.   Prescriptions: DILTIAZEM HCL CR 180 MG XR24H-CAP (DILTIAZEM HCL) 1 tab once daily  #30 x 11   Entered and Authorized by:   Minus Breeding MD   Signed by:   Minus Breeding MD on 08/02/2010   Method used:   Electronically to        [facility] [id number]* (retail)       [address] [place], [place]  [address]       Ph: [phone]       Fax: [phone]   RxID:   [id number]  Orders Added: 1)  TB Skin Test [86580] 2)  Admin 1st Vaccine [90471] 3)  Est. Patient Level IV [16109]   Immunizations Administered:  PPD Skin Test:    Vaccine Type: PPD    Site: left forearm    Mfr: Sanofi Pasteur    Dose: 0.1 ml    Route: ID    Given by: Brenton Grills CMA (AAMA)    Exp. Date: 10/23/2011    Lot #: [id number]  PPD Results    Date of reading: 08/04/2010    Results: < 5mm    Interpretation: negative   Immunizations Administered:  PPD Skin Test:    Vaccine Type: PPD    Site: left forearm    Mfr: Sanofi Pasteur    Dose: 0.1 ml    Route: ID    Given by: Brenton Grills CMA (AAMA)    Exp. Date: 10/23/2011    Lot #: [id number]

## 2010-08-08 NOTE — Assessment & Plan Note (Signed)
Summary: twin rix/Javel Hersh/lb  Nurse Visit   Allergies: No Known Drug Allergies  Immunizations Administered:  TwinRix # 1:    Vaccine Type: TwinRix    Site: left deltoid    Mfr: GlaxoSmithKline    Dose: 1.0 ml    Route: IM    Given by: Margaret Pyle, CMA    Exp. Date: 03/03/2012    Lot #: [id number]    VIS given: 02/13/07 version given July 31, 2010.  Orders Added: 1)  TwinRix 1ml ( Hep A&B Adult dose) [90636] 2)  Admin 1st Vaccine [90471]

## 2010-08-08 NOTE — Assessment & Plan Note (Signed)
Summary: heart palp,heart racing/john--ok robin   Vital Signs:  Patient profile:   35 year old female Height:      61 inches (154.94 cm) Weight:      182.25 pounds (82.84 kg) BMI:     34.56 O2 Sat:      96 % on Room air Temp:     99.6 degrees F (37.56 degrees C) oral Pulse rate:   109 / minute Resp:     16 per minute BP sitting:   138 / 98  (left arm) Cuff size:   large  Vitals Entered By: Burnard Leigh CMA(AAMA) (July 25, 2010 4:11 PM)  O2 Flow:  Room air CC: Pt c/o heart palpatations, racing, and increase in BP readings x2 weeks/sls,cma Is Patient Diabetic? No   CC:  Pt c/o heart palpatations, racing, and increase in BP readings x2 weeks/sls, and cma.  History of Present Illness: pt states 2 weeks of intermittent moderate palpitations in the chest, but no assoc syncope.  she says she has had this in the past.  she says she is at risk for pregnancy.    Current Medications (verified): 1)  Prenatal Plus/iron 27-1 Mg Tabs (Prenatal Vit-Fe Fumarate-Fa) .Marland Kitchen.. 1 By Mouth Daily 2)  Labetalol Hcl 100 Mg Tabs (Labetalol Hcl) .Marland Kitchen.. 1po Two Times A Day 3)  Proventil Hfa 108 (90 Base) Mcg/act Aers (Albuterol Sulfate) .... 2 Puffs Qid As Needed 4)  Furosemide 40 Mg Tabs (Furosemide) .... 1/2 -1 By Mouth Once Daily As Needed 5)  Klor-Con 10 10 Meq Cr-Tabs (Potassium Chloride) .Marland Kitchen.. 1po Once Daily As Needed Lasix 6)  Naproxen 500 Mg Tabs (Naproxen) .Marland Kitchen.. 1po Two Times A Day As Needed  Allergies (verified): No Known Drug Allergies  Past History:  Past Medical History: Last updated: 06/28/2008 GOITER (ICD-240.9) PREVENTIVE HEALTH CARE (ICD-V70.0) ALLERGIC RHINITIS (ICD-477.9) HYPERTENSION (ICD-401.9) ASTHMA (ICD-493.90)  Past Surgical History: Reviewed history from 06/01/2008 and no changes required. Caesarean section - 2007 s/p sinus surgury 2006  Review of Systems       The patient complains of headaches.         pt says she has lost weight, due to her efforts.  Physical  Exam  General:  obese.  no distress. Neck:  i am uncertain if the thyroid is enlarged. Lungs:  Clear to auscultation bilaterally. Normal respiratory effort.  Heart:  tachycardic rate and normal rhythm without murmurs or gallops noted. Normal S1,S2.     Impression & Recommendations:  Problem # 1:  PALPITATIONS (ICD-785.1) uncertain etiology med regimen would need to consider her risk of pregnancy, which has increased due to her recent weight-loss.  Problem # 2:  edema mild  Problem # 3:  HYPERTENSION (ICD-401.9) needs increased rx  Problem # 4:  GOITER, MULTINODULAR (ICD-241.1) Assessment: Unchanged  Medications Added to Medication List This Visit: 1)  Diltiazem Hcl Cr 120 Mg Xr12h-cap (Diltiazem hcl) .Marland Kitchen.. 1 tab once daily  Other Orders: T-Urine 24 Hr. Metanephrines [phone]) T-Urine 24 Hr. Catecholamines [phone]) EKG w/ Interpretation (93000) TLB-TSH (Thyroid Stimulating Hormone) (84443-TSH) TLB-BMP (Basic Metabolic Panel-BMET) (80048-METABOL) TLB-CBC Platelet - w/Differential (85025-CBCD) Radiology Referral (Radiology) Est. Patient Level IV (66063)  Patient Instructions: 1)  change labetalol to diltiazem, 180 mg once daily 2)  check 24-hr urine for "adrenaline" 3)  also, blood tests are being ordered for you today.  please call [phone] to hear your test results. 4)  recheck thyroid ultrasound.  you will be called with a day and time for an appointment.  5)  please see dr Jonny Ruiz is a few weeks. call sooner of blood pressure and/or heart rate are off.   6)  try to minimize furosemide 7)  (update: i left message on phone-tree:  rx as we discussed) Prescriptions: DILTIAZEM HCL CR 120 MG XR12H-CAP (DILTIAZEM HCL) 1 tab once daily  #30 x 11   Entered and Authorized by:   Minus Breeding MD   Signed by:   Minus Breeding MD on 07/25/2010   Method used:   Electronically to        [facility] (retail)       [address] [place], [place]  [address]       Ph: [phone]       Fax: [phone]   RxID:   [id number]    Orders Added: 1)  T-Urine 24 Hr. Metanephrines [88416-60630] 2)  T-Urine 24 Hr. Catecholamines [phone] 3)  EKG w/ Interpretation [93000] 4)  TLB-TSH (Thyroid Stimulating Hormone) [84443-TSH] 5)  TLB-BMP (Basic Metabolic Panel-BMET) [80048-METABOL] 6)  TLB-CBC Platelet - w/Differential [85025-CBCD] 7)  Radiology Referral [Radiology] 8)  Est. Patient Level IV [57322]

## 2010-08-08 NOTE — Letter (Signed)
Summary: Exam forms/[facility]  Exam forms/[facility]   Imported By: Sherian Rein 08/02/2010 12:26:59  _____________________________________________________________________  External Attachment:    Type:   Image     Comment:   External Document

## 2010-08-09 ENCOUNTER — Encounter: Payer: Self-pay | Admitting: Internal Medicine

## 2010-08-09 ENCOUNTER — Encounter: Payer: Self-pay | Admitting: *Deleted

## 2010-08-09 DIAGNOSIS — Z111 Encounter for screening for respiratory tuberculosis: Secondary | ICD-10-CM

## 2010-08-15 ENCOUNTER — Ambulatory Visit (INDEPENDENT_AMBULATORY_CARE_PROVIDER_SITE_OTHER): Payer: BC Managed Care – PPO | Admitting: Internal Medicine

## 2010-08-15 ENCOUNTER — Encounter: Payer: Self-pay | Admitting: Internal Medicine

## 2010-08-15 DIAGNOSIS — Z0000 Encounter for general adult medical examination without abnormal findings: Secondary | ICD-10-CM

## 2010-08-15 DIAGNOSIS — R609 Edema, unspecified: Secondary | ICD-10-CM

## 2010-08-15 DIAGNOSIS — I10 Essential (primary) hypertension: Secondary | ICD-10-CM

## 2010-08-15 DIAGNOSIS — F411 Generalized anxiety disorder: Secondary | ICD-10-CM

## 2010-08-15 DIAGNOSIS — F419 Anxiety disorder, unspecified: Secondary | ICD-10-CM

## 2010-08-15 DIAGNOSIS — M79609 Pain in unspecified limb: Secondary | ICD-10-CM

## 2010-08-15 DIAGNOSIS — M79604 Pain in right leg: Secondary | ICD-10-CM

## 2010-08-15 HISTORY — DX: Anxiety disorder, unspecified: F41.9

## 2010-08-15 NOTE — Patient Instructions (Addendum)
You will be contacted regarding the referral for: Leg arterial dopplers Continue all other medications as before  Please call if you would like the Lexapro 10 mg prescription for nerves Please return in 6 months for CPX with Labs done at the Lab in the basement 3-5 days before

## 2010-08-15 NOTE — Progress Notes (Signed)
Here to f/u  - c/o labile HTN with SBP 150-160 and diast 96 - 110;  Has  Been doing clinicals lately, and clinical peers check her BP there and seems up and down,  Later BP at home by pt cuff is 124-136.   Denies worsening depressive symptoms, suicidal ideation, or panic; Has ongoing anxiety, and BP's away from clinicals are better.  And labs for "adrenal tumor" neg per Dr Everardo All recent.  Has lost 20 lbs overall with better diet but  Still with venous edema daily to feet and ankles despite low sodium diet; also recent ortho check with no DVT;   Taking the lasix daily, not much better than the HCTZ previously. Pt denies chest pain, increased sob or doe, wheezing, orthopnea, PND,  palpitations, dizziness or syncope. Pt denies new neurological symptoms such as new headache, or facial or extremity weakness or numbness.   Pt denies polydipsia, polyuria  Pt states overall good compliance with meds.   Pt denies fever, wt loss, night sweats, loss of appetite, or other constitutional symptoms  Overall good compliance with treatment, and good medicine tolerability.   Does have recurring right calf pain, mild worse with ambulation sometimes, sometimes with cramps at night  Review of Systems  Constitutional: Negative for diaphoresis and unexpected weight change.  HENT: Negative for drooling and tinnitus.   Eyes: Negative for photophobia and visual disturbance.  Respiratory: Negative for choking and stridor.   Gastrointestinal: Negative for vomiting and blood in stool.  Genitourinary: Negative for hematuria and decreased urine volume.  Musculoskeletal: Negative for gait problem.  Skin: Negative for color change and wound.  Neurological: Negative for tremors and numbness.  Psychiatric/Behavioral: Negative for decreased concentration. The patient is not hyperactive.    Past Medical History  Diagnosis Date  . ALLERGIC RHINITIS 06/01/2008  . Anxiety 08/15/2010  . ASTHMA 06/01/2008  . BURSITIS, LEFT HIP 04/28/2010  .  GOITER, MULTINODULAR 06/28/2008  . Hirsutism 06/02/2009  . HYPERTENSION 06/01/2008  . Palpitations 07/25/2010  . PERIPHERAL EDEMA 04/28/2010  . TONSILLITIS, RECURRENT 05/05/2009   Past Surgical History  Procedure Date  . Cesarean section 2007  . S/p sinus surgury 2006    reports that she has never smoked. She does not have any smokeless tobacco history on file. She reports that she does not drink alcohol or use illicit drugs. family history includes Hyperlipidemia in her maternal grandfather, maternal grandmother, and mother; Hypertension in her maternal grandfather, maternal grandmother, and mother; and Lung cancer in her maternal grandfather. Not on File  Physical Exam  Constitutional: Pt appears well-developed and well-nourished.  HENT: Head: Normocephalic.  Right Ear: External ear normal.  Left Ear: External ear normal.  Eyes: Conjunctivae and EOM are normal. Pupils are equal, round, and reactive to light.  Neck: Normal range of motion. Neck supple.  Cardiovascular: Normal rate and regular rhythm.   Pulmonary/Chest: Effort normal and breath sounds normal.  Abd:  Soft, NT, non-distended, + BS Neurological: Pt is alert. No cranial nerve deficit.  Skin: Skin is warm. No erythema.  Psychiatric: Pt behavior is normal. Thought content normal.

## 2010-08-18 ENCOUNTER — Encounter: Payer: BC Managed Care – PPO | Admitting: *Deleted

## 2010-08-20 ENCOUNTER — Encounter: Payer: Self-pay | Admitting: Internal Medicine

## 2010-08-20 NOTE — Assessment & Plan Note (Signed)
Mild ankle at best but very concerning to her; .The current medical regimen is effective;  continue present plan and medications.

## 2010-08-20 NOTE — Assessment & Plan Note (Signed)
stable overall by hx and exam, ok to continue meds/tx as is   Lab Results  Component Value Date   WBC 5.1 07/25/2010   HGB 13.5 07/25/2010   HCT 39.5 07/25/2010   PLT 207.0 07/25/2010   CHOL 180 06/02/2009   TRIG 104.0 06/02/2009   HDL 45.90 06/02/2009   ALT 39* 06/02/2009   AST 25 06/02/2009   NA 136 07/25/2010   K 3.5 07/25/2010   CL 102 07/25/2010   CREATININE 0.7 07/25/2010   BUN 15 07/25/2010   CO2 27 07/25/2010   TSH 1.41 07/25/2010

## 2010-08-20 NOTE — Assessment & Plan Note (Signed)
I suspect significant psych overlay , which she tends to minimize;  I asked her to consider lexapro 10 mg

## 2010-08-20 NOTE — Assessment & Plan Note (Signed)
somewhat doubt signficant  Vascular dz at her age but due to symtpoms and risk factors will ask for LE art dopplers, f/u any worsening symptoms

## 2010-08-24 NOTE — Progress Notes (Signed)
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 Pt is informed that we will read 2nd test on Fri 08/11/2010 and give necessary forms needed for school requirements. Initial call taken by: Burnard Leigh [facility]),  August 04, 2010 9:26 AM  Follow-up for Phone Call        done Follow-up by: Burnard Leigh [facility]),  August 16, 2010 5:25 PM

## 2010-08-24 NOTE — Letter (Signed)
Summary: Results Follow-up Letter  [facility]  [address] [place], [place] [address]   Phone: [phone]  Fax: [phone]    08/09/2010  TB Skin Test    Laura [name]    Date TB Test Placed:  ________________  L or R forearm  TB Test Placed by:  ___________________  Lot #:  __________________        Expiration Date: _____________  Date TB Test Read:  ____________________    Result ___________MM  TB Test Read by:  _______________

## 2010-08-24 NOTE — Assessment & Plan Note (Signed)
Summary: TB TEST - 2nd  Nurse Visit   Allergies: No Known Drug Allergies  Immunizations Administered:  PPD Skin Test:    Vaccine Type: PPD    Site: right forearm    Mfr: Sanofi Pasteur    Dose: 0.1 ml    Route: ID    Given by: Burnard Leigh CMA(AAMA)    Exp. Date: 10/23/2011    Lot #: [id number]  PPD Results    Date of reading: 08/11/2010    Results: < 5mm    Interpretation: negative  Orders Added: 1)  TB Skin Test [86580] 2)  Admin 1st Vaccine [56433]

## 2010-08-31 ENCOUNTER — Encounter: Payer: Self-pay | Admitting: Internal Medicine

## 2010-08-31 ENCOUNTER — Ambulatory Visit (INDEPENDENT_AMBULATORY_CARE_PROVIDER_SITE_OTHER): Payer: BC Managed Care – PPO | Admitting: *Deleted

## 2010-08-31 DIAGNOSIS — Z23 Encounter for immunization: Secondary | ICD-10-CM

## 2010-09-29 ENCOUNTER — Other Ambulatory Visit: Payer: Self-pay

## 2010-09-29 MED ORDER — DILTIAZEM HCL ER 180 MG PO CP24: 180.0000 mg | ORAL_CAPSULE | Freq: Every day | ORAL | Status: DC

## 2010-10-09 ENCOUNTER — Encounter: Payer: Self-pay | Admitting: Internal Medicine

## 2010-10-09 ENCOUNTER — Ambulatory Visit (INDEPENDENT_AMBULATORY_CARE_PROVIDER_SITE_OTHER): Payer: BC Managed Care – PPO | Admitting: Internal Medicine

## 2010-10-09 VITALS — BP 132/90 | HR 102 | Temp 99.2°F | Ht 61.0 in | Wt 186.4 lb

## 2010-10-09 DIAGNOSIS — J029 Acute pharyngitis, unspecified: Secondary | ICD-10-CM

## 2010-10-09 DIAGNOSIS — Z0000 Encounter for general adult medical examination without abnormal findings: Secondary | ICD-10-CM

## 2010-10-09 DIAGNOSIS — J039 Acute tonsillitis, unspecified: Secondary | ICD-10-CM

## 2010-10-09 HISTORY — DX: Acute pharyngitis, unspecified: J02.9

## 2010-10-09 MED ORDER — LEVOFLOXACIN 500 MG PO TABS: 500.0000 mg | ORAL_TABLET | Freq: Every day | ORAL | Status: AC

## 2010-10-09 NOTE — Assessment & Plan Note (Signed)
Pt now willing for surgury - to refer ENT (prefers new ENT for her own reasons)

## 2010-10-09 NOTE — Assessment & Plan Note (Signed)
Overall doing well, age appropriate education and counseling updated, referrals for preventative services and immunizations addressed, dietary and smoking counseling addressed, most recent labs and ECG reviewed.  I have personally reviewed and have noted: 1) the patient's medical and social history 2) The pt's use of alcohol, tobacco, and illicit drugs 3) The patient's current medications and supplements 4) Functional ability including ADL's, fall risk, home safety risk, hearing and visual impairment 5) Diet and physical activities 6) Evidence for depression or mood disorder 7) The patient's height, weight, and BMI have been recorded in the chart I have made referrals, and provided counseling and education based on review of the above Declines further labs today, just had labs done feb 2012, reviewed with pt

## 2010-10-09 NOTE — Assessment & Plan Note (Addendum)
Moderate, for antibx cousre,  to f/u any worsening symptoms or concerns; not pregnant - LMP 3 days ago, abstinent for now

## 2010-10-09 NOTE — Patient Instructions (Signed)
Take all new medications as prescribed Continue all other medications as before You will be contacted regarding the referral for: ENT as you preferred Please return in 1 year for your yearly visit, or sooner if needed, with Lab testing done 3-5 days before

## 2010-10-09 NOTE — Progress Notes (Signed)
Subjective:    Patient ID: Laura [name], female    DOB: 12/06/1975, 35 y.o.   MRN: [id number]  HPI   Here with 3 days acute onset fever, facial pain, pressure, general weakness and malaise, and greenish d/c, with slight ST, but little to no cough and Pt denies chest pain, increased sob or doe, wheezing, orthopnea, PND, increased LE swelling, palpitations, dizziness or syncope.  Does also have acute on chronic tonsillitis as well, has seen ENT who offered to remove, but pt deferred at the time due to work and school schedule.  Now very interested in having them out. Pt denies new neurological symptoms such as new headache, or facial or extremity weakness or numbness   Pt denies polydipsia, polyuria  Pt states overall good compliance with meds, trying to follow lower cholesterol diet, wt overall stable but little exercise however.    Past Medical History  Diagnosis Date  . ALLERGIC RHINITIS 06/01/2008  . Anxiety 08/15/2010  . ASTHMA 06/01/2008  . BURSITIS, LEFT HIP 04/28/2010  . GOITER, MULTINODULAR 06/28/2008  . Hirsutism 06/02/2009  . HYPERTENSION 06/01/2008  . Palpitations 07/25/2010  . PERIPHERAL EDEMA 04/28/2010  . TONSILLITIS, RECURRENT 05/05/2009   Past Surgical History  Procedure Date  . Cesarean section 2007  . S/p sinus surgury 2006    reports that she has never smoked. She does not have any smokeless tobacco history on file. She reports that she does not drink alcohol or use illicit drugs. family history includes Hyperlipidemia in her maternal grandfather, maternal grandmother, and mother; Hypertension in her maternal grandfather, maternal grandmother, and mother; and Lung cancer in her maternal grandfather. No Known Allergies Current Outpatient Prescriptions on File Prior to Visit  Medication Sig Dispense Refill  . albuterol (PROVENTIL HFA;VENTOLIN HFA) 108 (90 BASE) MCG/ACT inhaler Inhale 2 puffs into the lungs every 4 (four) hours as needed.        . diltiazem (DILACOR XR) 180 MG  24 hr capsule Take 1 capsule (180 mg total) by mouth daily.  90 capsule  1  . furosemide (LASIX) 40 MG tablet Take 40 mg by mouth. 1/2 - 1 by mouth once daily as needed       . potassium chloride (KLOR-CON 10) 10 MEQ CR tablet Take 10 mEq by mouth daily.        . Prenatal Vit-Fe Fumarate-FA (PRENATAL PLUS/IRON) 27-1 MG TABS Take by mouth daily.         Review of Systems Review of Systems  Constitutional: Negative for diaphoresis, activity change, appetite change and unexpected weight change.  HENT: Negative for hearing loss, ear pain, facial swelling, mouth sores and neck stiffness.   Eyes: Negative for pain, redness and visual disturbance.  Respiratory: Negative for shortness of breath and wheezing.   Cardiovascular: Negative for chest pain and palpitations.  Gastrointestinal: Negative for diarrhea, blood in stool, abdominal distention and rectal pain.  Genitourinary: Negative for hematuria, flank pain and decreased urine volume.  Musculoskeletal: Negative for myalgias and joint swelling.  Skin: Negative for color change and wound.  Neurological: Negative for syncope and numbness.  Hematological: Negative for adenopathy.  Psychiatric/Behavioral: Negative for hallucinations, self-injury, decreased concentration and agitation.      Objective:   Physical Exam BP 132/90  Pulse 102  Temp(Src) 99.2 F (37.3 C) (Oral)  Ht 5\' 1"  (1.549 m)  Wt 186 lb 6 oz (84.539 kg)  BMI 35.22 kg/m2  SpO2 96%  LMP 09/24/2010 Physical Exam  VS noted, mild ill Constitutional:  Pt is oriented to person, place, and time. Appears well-developed and well-nourished.  HENT:  Head: Normocephalic and atraumatic.  Right Ear: External ear normal.  Left Ear: External ear normal.  Nose: Nose normal.  Mouth/Throat: Oropharynx is clear and moist.  Bilat tm's mild erythema.  Sinus nontender.  Pharynx mild erythema and tonsil 3+ enlarged, red, without drainage Eyes: Conjunctivae and EOM are normal. Pupils are equal,  round, and reactive to light.  Neck: Normal range of motion. Neck supple. No JVD present. No tracheal deviation present. but some submand LA noted as well Cardiovascular: Normal rate, regular rhythm, normal heart sounds and intact distal pulses.   Pulmonary/Chest: Effort normal and breath sounds normal.  Abdominal: Soft. Bowel sounds are normal. There is no tenderness.  Musculoskeletal: Normal range of motion. Exhibits no edema.  Lymphadenopathy:  Has no cervical adenopathy.  Neurological: Pt is alert and oriented to person, place, and time. Pt has normal reflexes. No cranial nerve deficit.  Skin: Skin is warm and dry. No rash noted.  Psychiatric:  Has  normal mood and affect. Behavior is normal.         Assessment & Plan:

## 2010-10-11 ENCOUNTER — Other Ambulatory Visit: Payer: Self-pay | Admitting: Cardiology

## 2010-10-11 DIAGNOSIS — M79609 Pain in unspecified limb: Secondary | ICD-10-CM

## 2010-10-12 ENCOUNTER — Encounter (INDEPENDENT_AMBULATORY_CARE_PROVIDER_SITE_OTHER): Payer: BC Managed Care – PPO | Admitting: Cardiology

## 2010-10-12 DIAGNOSIS — M79609 Pain in unspecified limb: Secondary | ICD-10-CM

## 2010-10-12 DIAGNOSIS — I70219 Atherosclerosis of native arteries of extremities with intermittent claudication, unspecified extremity: Secondary | ICD-10-CM

## 2010-10-13 NOTE — H&P (Signed)
NAME:  Laura [name], Laura [name]         ACCOUNT NO.:  [id number]   MEDICAL RECORD NO.:  [id number]          PATIENT TYPE:  INP   LOCATION:  9372                          FACILITY:  [facility]   PHYSICIAN:  Lenoard Aden, M.D.DATE OF BIRTH:  1975-08-10   DATE OF ADMISSION:  03/05/2006  DATE OF DISCHARGE:                                HISTORY & PHYSICAL   CHIEF COMPLAINT:  Edema and postpartum preeclampsia.   She is a 35 year old African-American female, postop day 4, status post C-  section. Discharged home and came back with elevated blood pressure, edema,  and abnormal labs, history of infertility, and hypertension.   MEDICATIONS INCLUDE:  Procardia and HCTZ, ibuprofen and Percocet for pain.   She denies headaches, visual symptoms or epigastric pain. She has had normal  bowel movements.   PHYSICAL EXAMINATION:  Blood pressure 140/90.  HEENT:  Normal.  LUNGS:  Clear.  HEART:  Regular rhythm.  ABDOMEN:  Soft, postoperative, gravid, nontender. Incision is draining clear  fluid but, otherwise, intact.  EXTREMITIES:  Reveal 3+ edema. Negative calf tenderness, negative Homans  sign. The DTRs 1+. No evidence of clonus.  NEUROLOGIC EXAM:  Is intact.  SKIN:  Is intact.   LABORATORY VALUES INCLUDE:  An LDH of 302, abnormal liver function tests  with both SGOT and PT over 100. Uric acid 5.9.   IMPRESSION:  Postpartum preeclampsia.   PLAN:  Proceed with magnesium prophylaxis and blood pressure control over  the next 24 hours in the ICU.      Lenoard Aden, M.D.  Electronically Signed     RJT/MEDQ  D:  03/06/2006  T:  03/06/2006  Job:  [id number]

## 2010-10-13 NOTE — Op Note (Signed)
NAME:  Laura [name], Laura [name] NO.:  [id number]   MEDICAL RECORD NO.:  [id number]          PATIENT TYPE:  INP   LOCATION:  9107                          FACILITY:  [facility]   PHYSICIAN:  Richardean Sale, M.D.   DATE OF BIRTH:  30-Jul-1975   DATE OF PROCEDURE:  02/28/2006  DATE OF DISCHARGE:                                 OPERATIVE REPORT   PREOPERATIVE DIAGNOSES:  1. A 38-week intrauterine pregnancy.  2. Hypertension.  3. For induction of labor.  4. Arrest of dilation.   POSTOPERATIVE DIAGNOSES:  1. A 38-week intrauterine pregnancy.  2. Hypertension.  3. For induction of labor.  4. Arrest of dilation.   PROCEDURE:  Primary low transverse cesarean section.   SURGEON:  Richardean Sale, M.D.   ASSISTANT:  None.   COMPLICATIONS:  None.   ESTIMATED BLOOD LOSS:  500 mL.   ANESTHESIA:  Epidural.   OPERATIVE FINDINGS:  Viable female infant, occiput posterior with Apgars of 9  and 10.  Nuchal cord x1, intact placenta with three-vessel cord sent to  pathology, normal adnexa, birth weight 6 pounds   INDICATIONS:  This is a 35 year old African American female who underwent  induction of labor at 38 weeks for chronic hypertension with proteinuria and  possible superimposed preeclampsia.  The patient's induction was started  with Cervidil followed by Pitocin and amniotomy and intrauterine pressure  catheter was placed to monitor contraction strength.  The patient reached 6  cm dilation and failed to dilate further despite adequate labor with Pitocin  18 milliunits.  The patient was subsequently counseled on the need to  proceed with cesarean section for arrest of dilation.  Prior to the  procedure risks, benefits and alternatives of the procedure were discussed  with the patient in detail. We discussed the risks which include, but are  not limited to, hemorrhage requiring transfusion, infection, injury to bowel  or bladder or other organs which could require additional surgery  at the  time of this procedure or in the future and reviewed risks of DVT and  anesthesia.  The patient voiced understanding of all the above and desires  to proceed.  Informed consent has been obtained and all questions answered.   PROCEDURE:  The patient was taken to the operating room where her epidural  was tested and found to be adequate.  She was prepped and draped in the  usual sterile fashion with Betadine and a Foley catheter had already been  placed in the labor room.  We injected 10 mL of 0.25% plain Marcaine into  the area where the incision was to be made.  A Pfannenstiel skin incision  was then made with a scalpel.  This was carried down sharply to the fascia.  The fascia was incised in the midline and the fascial incision was then  extended laterally with the Mayo scissors.  The superior and inferior  aspects of the fascial incision were grasped with Kocher clamps elevated and  the underlying rectus muscles dissected off with both sharp and blunt  dissection.  The muscles were then separated in the midline.  The peritoneum  was identified  and entered sharply.  The peritoneal incision was then  extended superiorly and inferiorly with good visualization of the bladder.  The bladder blade was inserted and the vesicouterine peritoneum was grasped  with pickups and entered sharply with Metzenbaum scissors.  This incision  was then extended laterally and the bladder flap was created digitally.   The bladder blade was then reinserted and the lower uterine segment was  incised in a transverse fashion with scalpel.  Clear amniotic fluid was  noted.  This incision was then extended laterally with bandage scissors.  A  hand was then delivered into the incision and the infant's head was  delivered atraumatically occiput posterior presentation.  The nose and mouth  were suctioned with the bulb.  Nuchal cord x1 was reduced and the infant was  then delivered to the sterile field with a  vigorous cry.  The cord was  clamped and cut and the infant was handed off to awaiting NICU attendants.  Cord gas was obtained to 7.35.  Cord blood was sent.   The placenta was then removed, the uterus was cleared of all clot and debris  and the placenta was sent to pathology.  The uterine incision was then  repaired with a running chromic suture in a running locked fashion.  A  second layer of the same suture was placed in an imbricating fashion for  additional hemostasis.  Once the uterine incision was confirmed as  hemostatic, the adnexa were inspected and were normal.  The pelvis was then  irrigated copiously with warm normal saline.  The peritoneal surfaces,  subfascial surfaces and muscle surfaces were all inspected and any areas of  bleeding were cauterized with the Bovie.  The fascia was then reapproximated  with a running Vicryl suture.  The subcutaneous space was irrigated and  reapproximated with interrupted 2-0 plain gut suture and the skin was then  closed with staples.   The patient tolerated the procedure very well.  All sponge, lap, needle and  instrument counts were correct x2.  She was taken to the recovery room awake  and in stable condition and the baby was taken to the newborn nursery.      Richardean Sale, M.D.  Electronically Signed     JW/MEDQ  D:  03/01/2006  T:  03/01/2006  Job:  [id number]

## 2010-10-13 NOTE — Discharge Summary (Signed)
NAME:  Laura [name], Laura [name] NO.:  [id number]   MEDICAL RECORD NO.:  [id number]          PATIENT TYPE:  INP   LOCATION:  9107                          FACILITY:  [facility]   PHYSICIAN:  Richardean Sale, M.D.   DATE OF BIRTH:  1976/04/25   DATE OF ADMISSION:  02/27/2006  DATE OF DISCHARGE:  03/02/2006                               DISCHARGE SUMMARY   ADMITTING DIAGNOSIS:  A 38-week intrauterine pregnancy with elevated  blood pressures, chronic hypertension with superimposed preeclampsia for  induction of labor.   DISCHARGE DIAGNOSES:  1. A 38-week intrauterine pregnancy with elevated blood pressures,      chronic hypertension with superimposed preeclampsia for induction      of labor.  2. Arrest of dilation.  3. Status post primary cesarean delivery.   OPERATIVE FINDING:  Viable female infant, Apgars of 9/10.  Intact placenta  with three-vessel cord.   HOSPITAL COURSE/HISTORY OF PRESENT ILLNESS:  Please see admission  history and physical for details.  Briefly, this is a 35 year old  gravida 1, para 0 female who presented at [redacted] weeks gestation for  induction of labor secondary to elevated blood pressure, headache, with  chronic hypertension and suspected superimposed preeclampsia.  The  patient underwent induction of labor with Cervidil followed by Pitocin.  Her preeclampsia labs were within normal limits.  The patient's blood  pressure was stable throughout her labor.  The patient reached a maximum  dilation of 6 cm despite adequate labor.  She subsequently underwent  primary cesarean delivery.  Postoperatively, the patient's course was  unremarkable.  She was afebrile throughout her hospitalization.  She was  ambulating well, was tolerating regular diet, and was voiding without  difficulty.  Her blood pressures remained stable on Procardia.   LABORATORY STUDIES:  On admission, LDH 165, uric acid 4.9, platelet  count 164, hemoglobin 14.1, AST 23, ALT 27, creatinine 0.6.   Postop  hemoglobin 13.4 and platelet count 172.   DISPOSITION:  To home.   CONDITION:  Improved.   MEDICATIONS:  1. Ibuprofen 800 mg p.o. q.8h. p.r.n. pain.  2. Percocet 1-2 tabs p.o. q.6h. p.r.n. pain.  3. Procardia 30 mg p.o. daily.  4. Prenatal vitamin daily.   FOLLOW UP:  The patient will follow up in the next 48 hours for staple  removal.  In addition, she will follow up in 4 hours for a routine  postoperative visit.   INSTRUCTIONS:  Routine postoperative and postpartum instructions and  preeclampsia precautions were reviewed with the patient prior to her  discharge.      Richardean Sale, M.D.  Electronically Signed     JW/MEDQ  D:  04/16/2006  T:  04/16/2006  Job:  [id number]

## 2010-10-16 ENCOUNTER — Encounter: Payer: Self-pay | Admitting: Internal Medicine

## 2010-10-16 NOTE — Progress Notes (Signed)
Quick Note:  Voice message left on [employer] system - lab is negative, normal or otherwise stable, pt to continue same tx ______ 

## 2010-12-08 ENCOUNTER — Encounter: Payer: Self-pay | Admitting: Internal Medicine

## 2010-12-08 ENCOUNTER — Ambulatory Visit (INDEPENDENT_AMBULATORY_CARE_PROVIDER_SITE_OTHER): Payer: BC Managed Care – PPO | Admitting: Internal Medicine

## 2010-12-08 ENCOUNTER — Ambulatory Visit (INDEPENDENT_AMBULATORY_CARE_PROVIDER_SITE_OTHER)
Admission: RE | Admit: 2010-12-08 | Discharge: 2010-12-08 | Disposition: A | Discharge status: Home / Self Care | Payer: BC Managed Care – PPO | Source: Ambulatory Visit | Attending: Internal Medicine | Admitting: Internal Medicine

## 2010-12-08 DIAGNOSIS — R002 Palpitations: Secondary | ICD-10-CM

## 2010-12-08 DIAGNOSIS — I10 Essential (primary) hypertension: Secondary | ICD-10-CM

## 2010-12-08 DIAGNOSIS — F419 Anxiety disorder, unspecified: Secondary | ICD-10-CM

## 2010-12-08 DIAGNOSIS — Z136 Encounter for screening for cardiovascular disorders: Secondary | ICD-10-CM

## 2010-12-08 DIAGNOSIS — R079 Chest pain, unspecified: Secondary | ICD-10-CM

## 2010-12-08 DIAGNOSIS — F411 Generalized anxiety disorder: Secondary | ICD-10-CM

## 2010-12-08 DIAGNOSIS — K219 Gastro-esophageal reflux disease without esophagitis: Secondary | ICD-10-CM | POA: Insufficient documentation

## 2010-12-08 MED ORDER — PANTOPRAZOLE SODIUM 40 MG PO TBEC: 40.0000 mg | DELAYED_RELEASE_TABLET | Freq: Every day | ORAL | Status: DC

## 2010-12-08 NOTE — Patient Instructions (Addendum)
Your EKG was OK today Please go to XRAY in the Basement for the x-ray test Take all new medications as prescribed Continue all other medications as before Try to avoid stress if possible Please return in 10 mo with Lab testing done 3-5 days before

## 2010-12-08 NOTE — Assessment & Plan Note (Signed)
stable overall by hx and exam, most recent data reviewed with pt, and pt to continue medical treatment as before  BP Readings from Last 3 Encounters:  12/08/10 128/80  10/09/10 132/90  08/15/10 110/82

## 2010-12-08 NOTE — Assessment & Plan Note (Addendum)
Most c/w MSK I suspect, ok for tylenol or advil prn pain, for ecg and cxr today, but do not think she needs stress test at this time

## 2010-12-08 NOTE — Progress Notes (Signed)
Quick Note:  Voice message left on [employer] system - lab is negative, normal or otherwise stable, pt to continue same tx ______ 

## 2010-12-08 NOTE — Assessment & Plan Note (Signed)
.  Mild to mod, for generic protonix,  to f/u any worsening symptoms or concerns

## 2010-12-12 NOTE — Progress Notes (Signed)
Subjective:    Patient ID: Laura [name], female    DOB: [birth year], 35 y.o.   MRN: [id number]  HPI  Here with c/o 1 wk intermittent CP left chest , dull and sharp, pleuritic and nonexertional;  Also had some intermittent left shoulder tingling she though might be assoc and mentions "you know, women's heart pain is not the same as men's." (currently nursing student)  Has tender area to palpate as well, but no swelling, rash.  Pt denies  increased sob or doe, wheezing, orthopnea, PND, increased LE swelling, dizziness or syncope, but has had some occasional palpitations.  Pt denies new neurological symptoms such as new headache, or facial or extremity weakness or numbness   Pt denies polydipsia, polyuria.  Has overt reflux symptoms occasionally for several wks, has gained wt 4 lbs in 2 mo.  Nonsmoker, no DM, no elev chol. Denies worsening depressive symptoms, suicidal ideation, or panic, though has ongoing anxiety, not increased recently, per pt as she has no nursing class during the summer. Past Medical History  Diagnosis Date  . ALLERGIC RHINITIS 06/01/2008  . Anxiety 08/15/2010  . ASTHMA 06/01/2008  . BURSITIS, LEFT HIP 04/28/2010  . GOITER, MULTINODULAR 06/28/2008  . Hirsutism 06/02/2009  . HYPERTENSION 06/01/2008  . Palpitations 07/25/2010  . PERIPHERAL EDEMA 04/28/2010  . TONSILLITIS, RECURRENT 05/05/2009  . Pharyngitis, acute 10/09/2010   Past Surgical History  Procedure Date  . Cesarean section 2007  . S/p sinus surgury 2006    reports that she has never smoked. She does not have any smokeless tobacco history on file. She reports that she does not drink alcohol or use illicit drugs. family history includes Hyperlipidemia in her maternal grandfather, maternal grandmother, and mother; Hypertension in her maternal grandfather, maternal grandmother, and mother; and Lung cancer in her maternal grandfather. No Known Allergies Current Outpatient Prescriptions on File Prior to Visit  Medication Sig  Dispense Refill  . albuterol (PROVENTIL HFA;VENTOLIN HFA) 108 (90 BASE) MCG/ACT inhaler Inhale 2 puffs into the lungs every 4 (four) hours as needed.        . diltiazem (DILACOR XR) 180 MG 24 hr capsule Take 1 capsule (180 mg total) by mouth daily.  90 capsule  1  . furosemide (LASIX) 40 MG tablet Take 40 mg by mouth. 1/2 - 1 by mouth once daily as needed       . potassium chloride (KLOR-CON 10) 10 MEQ CR tablet Take 10 mEq by mouth daily.        . Prenatal Vit-Fe Fumarate-FA (PRENATAL PLUS/IRON) 27-1 MG TABS Take by mouth daily.         Review of Systems Review of Systems  Constitutional: Negative for diaphoresis and unexpected weight change.  HENT: Negative for drooling and tinnitus.   Eyes: Negative for photophobia and visual disturbance.  Respiratory: Negative for choking and stridor.   Gastrointestinal: Negative for vomiting and blood in stool.     Objective:   Physical Exam BP 128/80  Pulse 100  Temp(Src) 98.5 F (36.9 C) (Oral)  Resp 14  Wt 188 lb 4 oz (85.39 kg)  SpO2 98%  LMP 11/20/2010 Physical Exam  VS noted Constitutional: Pt appears well-developed and well-nourished.  HENT: Head: Normocephalic.  Right Ear: External ear normal.  Left Ear: External ear normal.  Eyes: Conjunctivae and EOM are normal. Pupils are equal, round, and reactive to light.  Neck: Normal range of motion. Neck supple.  Cardiovascular: Normal rate and regular rhythm.   Pulmonary/Chest: Effort normal  and breath sounds normal.  Abd:  Soft, NT, non-distended, + BS Neurological: Pt is alert. No cranial nerve deficit.  Skin: Skin is warm. No erythema.  Psychiatric: Pt behavior is normal. Thought content normal. 1+ nervous         Assessment & Plan:

## 2010-12-12 NOTE — Assessment & Plan Note (Signed)
Minor, ok to follow for worsening symtpoms

## 2010-12-12 NOTE — Assessment & Plan Note (Signed)
Denies need for tx at this time

## 2011-01-30 ENCOUNTER — Ambulatory Visit (INDEPENDENT_AMBULATORY_CARE_PROVIDER_SITE_OTHER): Payer: BC Managed Care – PPO

## 2011-01-30 DIAGNOSIS — Z23 Encounter for immunization: Secondary | ICD-10-CM

## 2011-01-31 ENCOUNTER — Ambulatory Visit: Payer: BC Managed Care – PPO

## 2011-02-13 ENCOUNTER — Ambulatory Visit (INDEPENDENT_AMBULATORY_CARE_PROVIDER_SITE_OTHER): Payer: BC Managed Care – PPO | Admitting: Internal Medicine

## 2011-02-13 ENCOUNTER — Encounter: Payer: Self-pay | Admitting: Internal Medicine

## 2011-02-13 VITALS — BP 138/84 | HR 103 | Temp 98.3°F | Ht 61.0 in | Wt 196.5 lb

## 2011-02-13 DIAGNOSIS — J309 Allergic rhinitis, unspecified: Secondary | ICD-10-CM

## 2011-02-13 DIAGNOSIS — I10 Essential (primary) hypertension: Secondary | ICD-10-CM

## 2011-02-13 DIAGNOSIS — J019 Acute sinusitis, unspecified: Secondary | ICD-10-CM

## 2011-02-13 DIAGNOSIS — J45909 Unspecified asthma, uncomplicated: Secondary | ICD-10-CM

## 2011-02-13 MED ORDER — LEVOFLOXACIN 500 MG PO TABS: 500.0000 mg | ORAL_TABLET | Freq: Every day | ORAL | Status: AC

## 2011-02-13 MED ORDER — FEXOFENADINE-PSEUDOEPHED ER 180-240 MG PO TB24: 1.0000 | ORAL_TABLET | Freq: Every day | ORAL | Status: DC

## 2011-02-13 NOTE — Assessment & Plan Note (Signed)
Mild to mod, for antibx course,  to f/u any worsening symptoms or concerns 

## 2011-02-13 NOTE — Patient Instructions (Addendum)
Take all new medications as prescribed Continue all other medications as before  

## 2011-02-18 ENCOUNTER — Encounter: Payer: Self-pay | Admitting: Internal Medicine

## 2011-02-18 NOTE — Progress Notes (Signed)
Subjective:    Patient ID: Laura [name], female    DOB: [birth year], 35 y.o.   MRN: [id number]  HPI   Here with 3 days acute onset fever, facial pain, pressure, general weakness and malaise, and greenish d/c, with slight ST, but little to no cough and Pt denies chest pain, increased sob or doe, wheezing, orthopnea, PND, increased LE swelling, palpitations, dizziness or syncope.  Does have several wks ongoing nasal allergy symptoms with clear congestion, itch and sneeze, without fever, pain, ST, cough or wheezing.  Pt denies new neurological symptoms such as new headache, or facial or extremity weakness or numbness   Pt denies polydipsia, polyuria.  Overall good compliance with treatment, and good medicine tolerability. Past Medical History  Diagnosis Date  . ALLERGIC RHINITIS 06/01/2008  . Anxiety 08/15/2010  . ASTHMA 06/01/2008  . BURSITIS, LEFT HIP 04/28/2010  . GOITER, MULTINODULAR 06/28/2008  . Hirsutism 06/02/2009  . HYPERTENSION 06/01/2008  . Palpitations 07/25/2010  . PERIPHERAL EDEMA 04/28/2010  . TONSILLITIS, RECURRENT 05/05/2009  . Pharyngitis, acute 10/09/2010   Past Surgical History  Procedure Date  . Cesarean section 2007  . S/p sinus surgury 2006    reports that she has never smoked. She does not have any smokeless tobacco history on file. She reports that she does not drink alcohol or use illicit drugs. family history includes Hyperlipidemia in her maternal grandfather, maternal grandmother, and mother; Hypertension in her maternal grandfather, maternal grandmother, and mother; and Lung cancer in her maternal grandfather. No Known Allergies Current Outpatient Prescriptions on File Prior to Visit  Medication Sig Dispense Refill  . albuterol (PROVENTIL HFA;VENTOLIN HFA) 108 (90 BASE) MCG/ACT inhaler Inhale 2 puffs into the lungs every 4 (four) hours as needed.        . calcium carbonate (TUMS - DOSED IN MG ELEMENTAL CALCIUM) 500 MG chewable tablet Chew 2 tablets by mouth daily. As  needed daily for heartburn/indigestion       . diltiazem (DILACOR XR) 180 MG 24 hr capsule Take 1 capsule (180 mg total) by mouth daily.  90 capsule  1  . furosemide (LASIX) 40 MG tablet Take 40 mg by mouth. 1/2 - 1 by mouth once daily as needed       . pantoprazole (PROTONIX) 40 MG tablet Take 1 tablet (40 mg total) by mouth daily.  90 tablet  3  . potassium chloride (KLOR-CON 10) 10 MEQ CR tablet Take 10 mEq by mouth daily.        . Prenatal Vit-Fe Fumarate-FA (PRENATAL PLUS/IRON) 27-1 MG TABS Take by mouth daily.         Review of Systems Review of Systems  Constitutional: Negative for diaphoresis and unexpected weight change.  HENT: Negative for drooling and tinnitus.   Eyes: Negative for photophobia and visual disturbance.  Respiratory: Negative for choking and stridor.   Gastrointestinal: Negative for vomiting and blood in stool.  Genitourinary: Negative for hematuria and decreased urine volume.     Objective:   Physical Exam BP 138/84  Pulse 103  Temp(Src) 98.3 F (36.8 C) (Oral)  Ht 5\' 1"  (1.549 m)  Wt 196 lb 8 oz (89.132 kg)  BMI 37.13 kg/m2  SpO2 99% Physical Exam  VS noted, mild ill Constitutional: Pt appears well-developed and well-nourished.  HENT: Head: Normocephalic.  Right Ear: External ear normal.  Left Ear: External ear normal.  Bilat tm's mild erythema.  Sinus tender bilat.  Pharynx mild erythema Eyes: Conjunctivae and EOM are normal. Pupils are  equal, round, and reactive to light.  Neck: Normal range of motion. Neck supple.  Cardiovascular: Normal rate and regular rhythm.   Pulmonary/Chest: Effort normal and breath sounds normal. no wheeze or rales Neurological: Pt is alert. No cranial nerve deficit.  Skin: Skin is warm. No erythema.  Psychiatric: Pt behavior is normal. Thought content normal. 1+ nervous        Assessment & Plan:

## 2011-02-18 NOTE — Assessment & Plan Note (Signed)
Mild uncontrolled - for allegra D prn, consider flonase,  to f/u any worsening symptoms or concerns

## 2011-02-18 NOTE — Assessment & Plan Note (Signed)
stable overall by hx and exam, most recent data reviewed with pt, and pt to continue medical treatment as before  BP Readings from Last 3 Encounters:  02/13/11 138/84  12/08/10 128/80  10/09/10 132/90

## 2011-02-18 NOTE — Assessment & Plan Note (Signed)
stable overall by hx and exam, most recent data reviewed with pt, and pt to continue medical treatment as before  SpO2 Readings from Last 3 Encounters:  02/13/11 99%  12/08/10 98%  10/09/10 96%

## 2011-04-04 ENCOUNTER — Other Ambulatory Visit: Payer: Self-pay | Admitting: Internal Medicine

## 2011-04-19 ENCOUNTER — Other Ambulatory Visit: Payer: Self-pay | Admitting: Internal Medicine

## 2011-09-24 ENCOUNTER — Ambulatory Visit (INDEPENDENT_AMBULATORY_CARE_PROVIDER_SITE_OTHER): Payer: PRIVATE HEALTH INSURANCE | Admitting: Internal Medicine

## 2011-09-24 ENCOUNTER — Encounter: Payer: Self-pay | Admitting: Internal Medicine

## 2011-09-24 VITALS — BP 142/92 | HR 103 | Temp 97.8°F | Ht 61.0 in | Wt 192.5 lb

## 2011-09-24 DIAGNOSIS — Z0000 Encounter for general adult medical examination without abnormal findings: Secondary | ICD-10-CM

## 2011-09-24 DIAGNOSIS — K219 Gastro-esophageal reflux disease without esophagitis: Secondary | ICD-10-CM

## 2011-09-24 DIAGNOSIS — J309 Allergic rhinitis, unspecified: Secondary | ICD-10-CM

## 2011-09-24 DIAGNOSIS — I10 Essential (primary) hypertension: Secondary | ICD-10-CM

## 2011-09-24 DIAGNOSIS — R079 Chest pain, unspecified: Secondary | ICD-10-CM

## 2011-09-24 MED ORDER — LABETALOL HCL 200 MG PO TABS: 200.0000 mg | ORAL_TABLET | Freq: Two times a day (BID) | ORAL | Status: DC

## 2011-09-24 MED ORDER — PANTOPRAZOLE SODIUM 40 MG PO TBEC: 40.0000 mg | DELAYED_RELEASE_TABLET | Freq: Every day | ORAL | Status: DC

## 2011-09-24 MED ORDER — FEXOFENADINE-PSEUDOEPHED ER 180-240 MG PO TB24: 1.0000 | ORAL_TABLET | Freq: Every day | ORAL | Status: AC

## 2011-09-24 MED ORDER — ALBUTEROL SULFATE HFA 108 (90 BASE) MCG/ACT IN AERS: 2.0000 | INHALATION_SPRAY | RESPIRATORY_TRACT | Status: DC | PRN

## 2011-09-24 MED ORDER — POTASSIUM CHLORIDE ER 10 MEQ PO TBCR: 10.0000 meq | EXTENDED_RELEASE_TABLET | Freq: Every day | ORAL | Status: DC

## 2011-09-24 MED ORDER — FUROSEMIDE 40 MG PO TABS: 40.0000 mg | ORAL_TABLET | Freq: Every day | ORAL | Status: DC

## 2011-09-24 NOTE — Assessment & Plan Note (Signed)
Uncontroleld, and needs lowe cost med, for labetolol 200 bid, d/c diltiazem, Continue all other medications as before, cont to monitor BP at home and work as she does

## 2011-09-24 NOTE — Assessment & Plan Note (Signed)
Suspect msk, for cxr  R/o underlying pulmonary

## 2011-09-24 NOTE — Assessment & Plan Note (Signed)

## 2011-09-24 NOTE — Progress Notes (Signed)
Subjective:    Patient ID: Laura [name], female    DOB: [birth year], 36 y.o.   MRN: [id number]  HPI  Here for wellness and f/u;  Overall doing ok;  Pt denies CP, worsening SOB, DOE, wheezing, orthopnea, PND, worsening LE edema, palpitations, dizziness or syncope, except for tender area right upper sternal border, also worse with twisting at the waist., but not with movement of the shoudler.  .  Pt denies neurological change such as new Headache, facial or extremity weakness.  Pt denies polydipsia, polyuria, or low sugar symptoms. Pt states overall good compliance with treatment and medications, good tolerability, and trying to follow lower cholesterol diet.  Pt denies worsening depressive symptoms, suicidal ideation or panic. No fever, wt loss, night sweats, loss of appetite, or other constitutional symptoms.  Pt states good ability with ADL's, low fall risk, home safety reviewed and adequate, no significant changes in hearing or vision, and occasionally active with exercise.  Needs all refills today as insurance runs out may 1, but resumes again august after 90 days with new job.  BP at home similar to today - persistently mild elevated on current meds Past Medical History  Diagnosis Date  . ALLERGIC RHINITIS 06/01/2008  . Anxiety 08/15/2010  . ASTHMA 06/01/2008  . BURSITIS, LEFT HIP 04/28/2010  . GOITER, MULTINODULAR 06/28/2008  . Hirsutism 06/02/2009  . HYPERTENSION 06/01/2008  . Palpitations 07/25/2010  . PERIPHERAL EDEMA 04/28/2010  . TONSILLITIS, RECURRENT 05/05/2009  . Pharyngitis, acute 10/09/2010   Past Surgical History  Procedure Date  . Cesarean section 2007  . S/p sinus surgury 2006    reports that she has never smoked. She does not have any smokeless tobacco history on file. She reports that she does not drink alcohol or use illicit drugs. family history includes Hyperlipidemia in her maternal grandfather, maternal grandmother, and mother; Hypertension in her maternal grandfather,  maternal grandmother, and mother; and Lung cancer in her maternal grandfather. No Known Allergies Current Outpatient Prescriptions on File Prior to Visit  Medication Sig Dispense Refill  . calcium carbonate (TUMS - DOSED IN MG ELEMENTAL CALCIUM) 500 MG chewable tablet Chew 2 tablets by mouth daily. As needed daily for heartburn/indigestion       . Prenatal Vit-Fe Fumarate-FA (PRENATAL PLUS/IRON) 27-1 MG TABS Take by mouth daily.        Marland Kitchen DISCONTD: albuterol (PROVENTIL HFA;VENTOLIN HFA) 108 (90 BASE) MCG/ACT inhaler Inhale 2 puffs into the lungs every 4 (four) hours as needed.        Marland Kitchen DISCONTD: furosemide (LASIX) 40 MG tablet TAKE ONE-HALF (1/2) TO ONE TABLET ONCE DAILY AS NEEDED  90 tablet  2  . DISCONTD: KLOR-CON 10 10 MEQ CR tablet TAKE 1 TABLET ONCE DAILY AS NEEDED WITH LASIX  90 tablet  2  . DISCONTD: pantoprazole (PROTONIX) 40 MG tablet Take 1 tablet (40 mg total) by mouth daily.  90 tablet  3  . labetalol (NORMODYNE) 200 MG tablet Take 1 tablet (200 mg total) by mouth 2 (two) times daily.  180 tablet  3   Review of Systems Review of Systems  Constitutional: Negative for diaphoresis, activity change, appetite change and unexpected weight change.  HENT: Negative for hearing loss, ear pain, facial swelling, mouth sores and neck stiffness.   Eyes: Negative for pain, redness and visual disturbance.  Respiratory: Negative for shortness of breath and wheezing.   Cardiovascular: Negative for chest pain and palpitations.  Gastrointestinal: Negative for diarrhea, blood in stool, abdominal distention and rectal  pain.  Genitourinary: Negative for hematuria, flank pain and decreased urine volume.  Musculoskeletal: Negative for myalgias and joint swelling.  Skin: Negative for color change and wound.  Neurological: Negative for syncope and numbness.  Hematological: Negative for adenopathy.  Psychiatric/Behavioral: Negative for hallucinations, self-injury, decreased concentration and agitation.       Objective:   Physical Exam BP 142/92  Pulse 103  Temp(Src) 97.8 F (36.6 C) (Oral)  Ht 5\' 1"  (1.549 m)  Wt 192 lb 8 oz (87.317 kg)  BMI 36.37 kg/m2  SpO2 97% Physical Exam  VS noted Constitutional: Pt is oriented to person, place, and time. Appears well-developed and well-nourished.  HENT:  Head: Normocephalic and atraumatic.  Right Ear: External ear normal.  Left Ear: External ear normal.  Nose: Nose normal.  Mouth/Throat: Oropharynx is clear and moist.  Eyes: Conjunctivae and EOM are normal. Pupils are equal, round, and reactive to light.  Neck: Normal range of motion. Neck supple. No JVD present. No tracheal deviation present.  Cardiovascular: Normal rate, regular rhythm, normal heart sounds and intact distal pulses.   Pulmonary/Chest: Effort normal and breath sounds normal.  Abdominal: Soft. Bowel sounds are normal. There is no tenderness.  Musculoskeletal: Normal range of motion. Exhibits trace pedal edema right > left Lymphadenopathy:  Has no cervical adenopathy.  Neurological: Pt is alert and oriented to person, place, and time. Pt has normal reflexes. No cranial nerve deficit.  Skin: Skin is warm and dry. No rash noted. tender area without redness or swelling noted right upper sternal border Psychiatric:  Has  normal mood and affect. Behavior is normal.     Assessment & Plan:

## 2011-09-24 NOTE — Patient Instructions (Addendum)
OK to stop the diltiazem Please start the labetolol 200 mg twice per day Continue all other medications as before You are given all the 90 day refills today (sent to your pharmacy) Please remember to followup with your GYN for the yearly pap smear and/or mammogram Please go to XRAY in the Basement for the x-ray test Please go to LAB in the Basement for the blood and/or urine tests to be done at your convenience You will be contacted by phone if any changes need to be made immediately.  Otherwise, you will receive a letter about your results with an explanation. Please monitor your Blood Pressure at home as you do;  Please call if consistently > 140/90, as we could increased the labetolol Please return in 1 year for your yearly visit, or sooner if needed, with Lab testing done 3-5 days before

## 2011-09-25 ENCOUNTER — Other Ambulatory Visit (INDEPENDENT_AMBULATORY_CARE_PROVIDER_SITE_OTHER): Payer: PRIVATE HEALTH INSURANCE

## 2011-09-25 ENCOUNTER — Encounter: Payer: Self-pay | Admitting: Internal Medicine

## 2011-09-25 ENCOUNTER — Ambulatory Visit (INDEPENDENT_AMBULATORY_CARE_PROVIDER_SITE_OTHER)
Admission: RE | Admit: 2011-09-25 | Discharge: 2011-09-25 | Disposition: A | Discharge status: Home / Self Care | Payer: PRIVATE HEALTH INSURANCE | Source: Ambulatory Visit | Attending: Internal Medicine | Admitting: Internal Medicine

## 2011-09-25 DIAGNOSIS — R079 Chest pain, unspecified: Secondary | ICD-10-CM

## 2011-09-25 DIAGNOSIS — Z0000 Encounter for general adult medical examination without abnormal findings: Secondary | ICD-10-CM

## 2011-09-25 LAB — HEPATIC FUNCTION PANEL
ALT: 19 U/L (ref 0–35)
AST: 20 U/L (ref 0–37)
Alkaline Phosphatase: 52 U/L (ref 39–117)
Total Bilirubin: 0.6 mg/dL (ref 0.3–1.2)

## 2011-09-25 LAB — CBC WITH DIFFERENTIAL/PLATELET
Eosinophils Relative: 2.7 % (ref 0.0–5.0)
HCT: 34.6 % — ABNORMAL LOW (ref 36.0–46.0)
Hemoglobin: 11.3 g/dL — ABNORMAL LOW (ref 12.0–15.0)
Lymphocytes Relative: 31.4 % (ref 12.0–46.0)
Lymphs Abs: 1.4 10*3/uL (ref 0.7–4.0)
Monocytes Relative: 11.3 % (ref 3.0–12.0)
Platelets: 239 10*3/uL (ref 150.0–400.0)
WBC: 4.5 10*3/uL (ref 4.5–10.5)

## 2011-09-25 LAB — LIPID PANEL
Cholesterol: 163 mg/dL (ref 0–200)
LDL Cholesterol: 92 mg/dL (ref 0–99)

## 2011-09-25 LAB — BASIC METABOLIC PANEL
BUN: 11 mg/dL (ref 6–23)
Chloride: 105 mEq/L (ref 96–112)
Creatinine, Ser: 0.8 mg/dL (ref 0.4–1.2)
GFR: 105.62 mL/min (ref >60.00)
Glucose, Bld: 96 mg/dL (ref 70–99)

## 2011-09-25 LAB — URINALYSIS, ROUTINE W REFLEX MICROSCOPIC
Bilirubin Urine: NEGATIVE
Total Protein, Urine: NEGATIVE
Urine Glucose: NEGATIVE

## 2011-09-25 LAB — TSH: TSH: 1.07 u[IU]/mL (ref 0.35–5.50)

## 2011-09-25 LAB — VITAMIN B12: Vitamin B-12: 471 pg/mL (ref 211–911)

## 2011-09-25 LAB — IBC PANEL
Iron: 33 ug/dL — ABNORMAL LOW (ref 42–145)
Transferrin: 269 mg/dL (ref 212.0–360.0)

## 2011-10-08 ENCOUNTER — Ambulatory Visit: Payer: BC Managed Care – PPO | Admitting: Internal Medicine

## 2011-10-08 DIAGNOSIS — Z0289 Encounter for other administrative examinations: Secondary | ICD-10-CM

## 2012-01-11 ENCOUNTER — Encounter: Payer: Self-pay | Admitting: Internal Medicine

## 2012-01-11 ENCOUNTER — Ambulatory Visit (INDEPENDENT_AMBULATORY_CARE_PROVIDER_SITE_OTHER): Payer: BC Managed Care – PPO | Admitting: Internal Medicine

## 2012-01-11 ENCOUNTER — Other Ambulatory Visit: Payer: BC Managed Care – PPO

## 2012-01-11 VITALS — BP 122/90 | HR 89 | Temp 97.4°F | Ht 61.0 in | Wt 187.2 lb

## 2012-01-11 DIAGNOSIS — Z283 Underimmunization status: Secondary | ICD-10-CM

## 2012-01-11 NOTE — Patient Instructions (Addendum)
Please go to LAB in the Basement for the blood tests to be done today - the "Hepatitis B surface Antibody, Quantitative" We will fax result to - [phone] Continue all other medications as before

## 2012-01-12 ENCOUNTER — Encounter: Payer: Self-pay | Admitting: Internal Medicine

## 2012-01-12 NOTE — Progress Notes (Signed)
  Subjective:    Patient ID: Laura [name], female    DOB: April 20, 1976, 36 y.o.   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

## 2012-01-14 ENCOUNTER — Encounter: Payer: Self-pay | Admitting: Internal Medicine

## 2012-01-23 ENCOUNTER — Ambulatory Visit: Payer: PRIVATE HEALTH INSURANCE | Admitting: Endocrinology

## 2012-01-25 ENCOUNTER — Encounter: Payer: Self-pay | Admitting: Endocrinology

## 2012-01-25 ENCOUNTER — Ambulatory Visit (INDEPENDENT_AMBULATORY_CARE_PROVIDER_SITE_OTHER): Payer: BC Managed Care – PPO | Admitting: Endocrinology

## 2012-01-25 VITALS — BP 118/82 | HR 95 | Temp 97.5°F | Ht 61.0 in | Wt 187.0 lb

## 2012-01-25 DIAGNOSIS — E042 Nontoxic multinodular goiter: Secondary | ICD-10-CM

## 2012-01-25 NOTE — Progress Notes (Signed)
Subjective:    Patient ID: Laura [name], female    DOB: February 28, 1976, 36 y.o.   MRN: [id number]  HPI Pt returns for f/u of small multinodular goiter.  She says she does not notice the goiter.   Past Medical History  Diagnosis Date  . ALLERGIC RHINITIS 06/01/2008  . Anxiety 08/15/2010  . ASTHMA 06/01/2008  . BURSITIS, LEFT HIP 04/28/2010  . GOITER, MULTINODULAR 06/28/2008  . Hirsutism 06/02/2009  . HYPERTENSION 06/01/2008  . Palpitations 07/25/2010  . PERIPHERAL EDEMA 04/28/2010  . TONSILLITIS, RECURRENT 05/05/2009  . Pharyngitis, acute 10/09/2010    Past Surgical History  Procedure Date  . Cesarean section 2007  . S/p sinus surgury 2006    History   Social History  . Marital Status: Married    Spouse Name: N/A    Number of Children: 1  . Years of Education: N/A   Occupational History  . stay at home mom    Social History Main Topics  . Smoking status: Never Smoker   . Smokeless tobacco: Not on file  . Alcohol Use: No  . Drug Use: No  . Sexually Active: Not on file   Other Topics Concern  . Not on file   Social History Narrative  . No narrative on file    Current Outpatient Prescriptions on File Prior to Visit  Medication Sig Dispense Refill  . albuterol (PROVENTIL HFA;VENTOLIN HFA) 108 (90 BASE) MCG/ACT inhaler Inhale 2 puffs into the lungs every 4 (four) hours as needed.  3 Inhaler  3  . calcium carbonate (TUMS - DOSED IN MG ELEMENTAL CALCIUM) 500 MG chewable tablet Chew 2 tablets by mouth daily. As needed daily for heartburn/indigestion       . fexofenadine-pseudoephedrine (ALLEGRA-D 24) 180-240 MG per 24 hr tablet Take 1 tablet by mouth daily.  90 tablet  3  . furosemide (LASIX) 40 MG tablet Take 1 tablet (40 mg total) by mouth daily.  90 tablet  3  . labetalol (NORMODYNE) 200 MG tablet Take 1 tablet (200 mg total) by mouth 2 (two) times daily.  180 tablet  3  . pantoprazole (PROTONIX) 40 MG tablet Take 1 tablet (40 mg total) by mouth daily.  90 tablet  3  .  potassium chloride (KLOR-CON 10) 10 MEQ tablet Take 1 tablet (10 mEq total) by mouth daily.  90 tablet  3  . Prenatal Vit-Fe Fumarate-FA (PRENATAL PLUS/IRON) 27-1 MG TABS Take by mouth daily.          No Known Allergies  Family History  Problem Relation Age of Onset  . Hypertension Mother   . Hyperlipidemia Mother   . Hypertension Maternal Grandmother   . Hyperlipidemia Maternal Grandmother   . Hypertension Maternal Grandfather   . Hyperlipidemia Maternal Grandfather   . Lung cancer Maternal Grandfather     BP 118/82  Pulse 95  Temp 97.5 F (36.4 C) (Oral)  Ht 5\' 1"  (1.549 m)  Wt 187 lb (84.823 kg)  BMI 35.33 kg/m2  SpO2 95%  LMP 01/02/2012   Review of Systems Denies neck pain    Objective:   Physical Exam VITAL SIGNS:  See vs page GENERAL: no distress NECK: There is only slight palpable thyroid enlargement.  No thyroid nodule is palpable.  No palpable lymphadenopathy at the anterior neck.   Lab Results  Component Value Date   TSH 1.07 09/25/2011      Assessment & Plan:  Multinodular goiter: she is euthyroid.  i told pt she needs  at most another ultrasound in another year, but she says she wants to keep close track of it.

## 2012-01-25 NOTE — Patient Instructions (Addendum)
Let's recheck the ultrasound.  you will receive a phone call, about a day and time for an appointment Please return in 1 year.   

## 2012-01-31 ENCOUNTER — Other Ambulatory Visit: Payer: Self-pay | Admitting: *Deleted

## 2012-01-31 MED ORDER — LABETALOL HCL 200 MG PO TABS: 200.0000 mg | ORAL_TABLET | Freq: Two times a day (BID) | ORAL | Status: DC

## 2012-01-31 NOTE — Telephone Encounter (Signed)
R'cd fax from [facility] for refill of Labetalol.

## 2012-02-01 ENCOUNTER — Inpatient Hospital Stay: Admission: RE | Admit: 2012-02-01 | Payer: BC Managed Care – PPO | Source: Ambulatory Visit

## 2012-03-19 ENCOUNTER — Ambulatory Visit: Payer: BC Managed Care – PPO

## 2012-03-27 ENCOUNTER — Ambulatory Visit (INDEPENDENT_AMBULATORY_CARE_PROVIDER_SITE_OTHER): Payer: BC Managed Care – PPO | Admitting: Internal Medicine

## 2012-03-27 ENCOUNTER — Encounter: Payer: Self-pay | Admitting: Internal Medicine

## 2012-03-27 ENCOUNTER — Other Ambulatory Visit (INDEPENDENT_AMBULATORY_CARE_PROVIDER_SITE_OTHER): Payer: BC Managed Care – PPO

## 2012-03-27 VITALS — BP 122/92 | HR 80 | Temp 98.9°F | Ht 60.0 in | Wt 192.4 lb

## 2012-03-27 DIAGNOSIS — R609 Edema, unspecified: Secondary | ICD-10-CM

## 2012-03-27 DIAGNOSIS — I10 Essential (primary) hypertension: Secondary | ICD-10-CM

## 2012-03-27 DIAGNOSIS — D509 Iron deficiency anemia, unspecified: Secondary | ICD-10-CM

## 2012-03-27 DIAGNOSIS — Z0000 Encounter for general adult medical examination without abnormal findings: Secondary | ICD-10-CM

## 2012-03-27 DIAGNOSIS — F411 Generalized anxiety disorder: Secondary | ICD-10-CM

## 2012-03-27 DIAGNOSIS — F419 Anxiety disorder, unspecified: Secondary | ICD-10-CM

## 2012-03-27 LAB — URINALYSIS, ROUTINE W REFLEX MICROSCOPIC
Bilirubin Urine: NEGATIVE
Leukocytes, UA: NEGATIVE
Nitrite: NEGATIVE
Urobilinogen, UA: 0.2 (ref 0.0–1.0)
pH: 6 (ref 5.0–8.0)

## 2012-03-27 LAB — CBC WITH DIFFERENTIAL/PLATELET
Basophils Absolute: 0.1 10*3/uL (ref 0.0–0.1)
Basophils Relative: 2 % (ref 0.0–3.0)
Hemoglobin: 12.8 g/dL (ref 12.0–15.0)
Lymphocytes Relative: 30.7 % (ref 12.0–46.0)
Monocytes Relative: 11.3 % (ref 3.0–12.0)
Neutro Abs: 2.7 10*3/uL (ref 1.4–7.7)
Neutrophils Relative %: 52.2 % (ref 43.0–77.0)
RBC: 3.99 Mil/uL (ref 3.87–5.11)
RDW: 13 % (ref 11.5–14.6)

## 2012-03-27 LAB — BASIC METABOLIC PANEL
BUN: 12 mg/dL (ref 6–23)
CO2: 26 mEq/L (ref 19–32)
Chloride: 106 mEq/L (ref 96–112)
Creatinine, Ser: 0.7 mg/dL (ref 0.4–1.2)
Glucose, Bld: 74 mg/dL (ref 70–99)
Potassium: 3.9 mEq/L (ref 3.5–5.1)

## 2012-03-27 LAB — IBC PANEL
Saturation Ratios: 36.4 % (ref 20.0–50.0)
Transferrin: 208.2 mg/dL — ABNORMAL LOW (ref 212.0–360.0)

## 2012-03-27 NOTE — Patient Instructions (Addendum)
Please go to LAB in the Basement for the blood and/or urine tests to be done today Continue all other medications as before, including the once per day iron 325 mg Please return in 6 mo with Lab testing done 3-5 days before

## 2012-03-28 ENCOUNTER — Encounter: Payer: Self-pay | Admitting: Internal Medicine

## 2012-03-28 NOTE — Assessment & Plan Note (Signed)
stable overall by hx and exam, most recent data reviewed with pt, and pt to continue medical treatment as before Lab Results  Component Value Date   WBC 5.1 03/27/2012   HGB 12.8 03/27/2012   HCT 38.2 03/27/2012   PLT 207.0 03/27/2012   GLUCOSE 74 03/27/2012   CHOL 163 09/25/2011   TRIG 70.0 09/25/2011   HDL 57.10 09/25/2011   LDLCALC 92 09/25/2011   ALT 19 09/25/2011   AST 20 09/25/2011   NA 138 03/27/2012   K 3.9 03/27/2012   CL 106 03/27/2012   CREATININE 0.7 03/27/2012   BUN 12 03/27/2012   CO2 26 03/27/2012   TSH 1.07 09/25/2011

## 2012-03-28 NOTE — Progress Notes (Signed)
Subjective:    Patient ID: Laura [name], female    DOB: February 15, 1976, 36 y.o.   MRN: [id number]  HPI  Here to f/u anemia, overall doing ok,  Pt denies chest pain, increased sob or doe, wheezing, orthopnea, PND, increased LE swelling, palpitations, dizziness or syncope.  Pt denies new neurological symptoms such as new headache, or facial or extremity weakness or numbness   Pt denies polydipsia, polyuria,  Pt states overall good compliance with meds, and cont's with her once daily iron so4 325 mg per day.  Does still have somewhat heavier menses than in yrs past, though not irreg or increasingly painful  Pt denies fever, wt loss, night sweats, loss of appetite, or other constitutional symptoms  Denies worsening depressive symptoms, suicidal ideation, or panic, though has ongoing anxiety, not increased recently.  No other acute complaints Past Medical History  Diagnosis Date  . ALLERGIC RHINITIS 06/01/2008  . Anxiety 08/15/2010  . ASTHMA 06/01/2008  . BURSITIS, LEFT HIP 04/28/2010  . GOITER, MULTINODULAR 06/28/2008  . Hirsutism 06/02/2009  . HYPERTENSION 06/01/2008  . Palpitations 07/25/2010  . PERIPHERAL EDEMA 04/28/2010  . TONSILLITIS, RECURRENT 05/05/2009  . Pharyngitis, acute 10/09/2010   Past Surgical History  Procedure Date  . Cesarean section 2007  . S/p sinus surgury 2006    reports that she has never smoked. She does not have any smokeless tobacco history on file. She reports that she does not drink alcohol or use illicit drugs. family history includes Hyperlipidemia in her maternal grandfather, maternal grandmother, and mother; Hypertension in her maternal grandfather, maternal grandmother, and mother; and Lung cancer in her maternal grandfather. No Known Allergies Current Outpatient Prescriptions on File Prior to Visit  Medication Sig Dispense Refill  . albuterol (PROVENTIL HFA;VENTOLIN HFA) 108 (90 BASE) MCG/ACT inhaler Inhale 2 puffs into the lungs every 4 (four) hours as needed.  3  Inhaler  3  . calcium carbonate (TUMS - DOSED IN MG ELEMENTAL CALCIUM) 500 MG chewable tablet Chew 2 tablets by mouth daily. As needed daily for heartburn/indigestion       . fexofenadine-pseudoephedrine (ALLEGRA-D 24) 180-240 MG per 24 hr tablet Take 1 tablet by mouth daily.  90 tablet  3  . furosemide (LASIX) 40 MG tablet Take 1 tablet (40 mg total) by mouth daily.  90 tablet  3  . labetalol (NORMODYNE) 200 MG tablet Take 1 tablet (200 mg total) by mouth 2 (two) times daily.  180 tablet  1  . pantoprazole (PROTONIX) 40 MG tablet Take 1 tablet (40 mg total) by mouth daily.  90 tablet  3  . potassium chloride (KLOR-CON 10) 10 MEQ tablet Take 1 tablet (10 mEq total) by mouth daily.  90 tablet  3  . Prenatal Vit-Fe Fumarate-FA (PRENATAL PLUS/IRON) 27-1 MG TABS Take by mouth daily.         Review of Systems  Constitutional: Negative for diaphoresis and unexpected weight change.  HENT: Negative for tinnitus.   Eyes: Negative for photophobia and visual disturbance.  Respiratory: Negative for choking and stridor.   Gastrointestinal: Negative for vomiting and blood in stool.  Genitourinary: Negative for hematuria and decreased urine volume.  Musculoskeletal: Negative for gait problem.  Skin: Negative for color change and wound.  Neurological: Negative for tremors and numbness.  Psychiatric/Behavioral: Negative for decreased concentration. The patient is not hyperactive.       Objective:   Physical Exam BP 122/92  Pulse 80  Temp 98.9 F (37.2 C) (Oral)  Ht 5' (  1.524 m)  Wt 192 lb 6 oz (87.261 kg)  BMI 37.57 kg/m2  SpO2 96% Physical Exam  VS noted Constitutional: Pt appears well-developed and well-nourished.  HENT: Head: Normocephalic.  Right Ear: External ear normal.  Left Ear: External ear normal.  Eyes: Conjunctivae and EOM are normal. Pupils are equal, round, and reactive to light.  Neck: Normal range of motion. Neck supple.  Cardiovascular: Normal rate and regular rhythm.     Pulmonary/Chest: Effort normal and breath sounds normal.  Abd:  Soft, NT, non-distended, + BS Neurological: Pt is alert. Not confused  Skin: Skin is warm. No erythema.  No LE edema Psychiatric: Pt behavior is normal. Thought content normal.     Assessment & Plan:

## 2012-03-28 NOTE — Assessment & Plan Note (Signed)
stable overall by hx and exam, most recent data reviewed with pt, and pt to continue medical treatment as before BP Readings from Last 3 Encounters:  03/27/12 122/92  01/25/12 118/82  01/11/12 122/90

## 2012-03-28 NOTE — Assessment & Plan Note (Signed)
stable overall by hx and exam, and pt to continue medical treatment as before 

## 2012-03-28 NOTE — Assessment & Plan Note (Addendum)
Ok for f/u labs today, should continue iron 325 qd for now,  to f/u any worsening symptoms or concerns, most likely related to heavier menses, consider f/u with GYN if worsens or difficult to control with simple oral iron  Lab Results  Component Value Date   WBC 5.1 03/27/2012   HGB 12.8 03/27/2012   HCT 38.2 03/27/2012   MCV 95.7 03/27/2012   PLT 207.0 03/27/2012

## 2012-04-02 ENCOUNTER — Telehealth: Payer: Self-pay

## 2012-04-02 NOTE — Telephone Encounter (Signed)
Patient informed. 

## 2012-04-02 NOTE — Telephone Encounter (Signed)
Called left message to call back 

## 2012-04-02 NOTE — Telephone Encounter (Signed)
These are minor not clinically signficant abnormalities;  No further eval or tx is needed at this time  Can be further discussed at any next visit

## 2012-04-02 NOTE — Telephone Encounter (Signed)
The patient would like an explanation of recent lab results.  She is concerned of the amount of hgb in her urine and her transferrin level being low.  Please advise call back number is [phone]

## 2012-06-05 ENCOUNTER — Ambulatory Visit (INDEPENDENT_AMBULATORY_CARE_PROVIDER_SITE_OTHER): Payer: Managed Care, Other (non HMO) | Admitting: Internal Medicine

## 2012-06-05 ENCOUNTER — Other Ambulatory Visit (INDEPENDENT_AMBULATORY_CARE_PROVIDER_SITE_OTHER): Payer: Managed Care, Other (non HMO)

## 2012-06-05 ENCOUNTER — Encounter: Payer: Self-pay | Admitting: Internal Medicine

## 2012-06-05 VITALS — BP 118/90 | HR 83 | Temp 98.1°F | Ht 60.0 in | Wt 195.0 lb

## 2012-06-05 DIAGNOSIS — M79601 Pain in right arm: Secondary | ICD-10-CM

## 2012-06-05 DIAGNOSIS — M79609 Pain in unspecified limb: Secondary | ICD-10-CM

## 2012-06-05 DIAGNOSIS — R209 Unspecified disturbances of skin sensation: Secondary | ICD-10-CM

## 2012-06-05 MED ORDER — PREDNISONE 20 MG PO TABS: ORAL_TABLET | ORAL | Status: DC

## 2012-06-05 NOTE — Progress Notes (Signed)
Subjective:    Patient ID: Laura [name], female    DOB: 01/01/1976, 37 y.o.   MRN: [id number]  HPI  Pt presents to the clinic today with c/o pain and tingling in her hands x 4 days. She denies having an injury to the area. She states that "it constantly feels like my hands are asleep". She has taken Ibuprofen which relieved the pain but not the numbness and tingling. This occurs in all of her fingers. She does not do any repetitive motions such as typing.  She denies any neck or shoulder pain. She has never had this happen before. The symptoms are not getting any worse, they are just not going away and it is making her concerned.  Review of Systems      Past Medical History  Diagnosis Date  . ALLERGIC RHINITIS 06/01/2008  . Anxiety 08/15/2010  . ASTHMA 06/01/2008  . BURSITIS, LEFT HIP 04/28/2010  . GOITER, MULTINODULAR 06/28/2008  . Hirsutism 06/02/2009  . HYPERTENSION 06/01/2008  . Palpitations 07/25/2010  . PERIPHERAL EDEMA 04/28/2010  . TONSILLITIS, RECURRENT 05/05/2009  . Pharyngitis, acute 10/09/2010    Current Outpatient Prescriptions  Medication Sig Dispense Refill  . albuterol (PROVENTIL HFA;VENTOLIN HFA) 108 (90 BASE) MCG/ACT inhaler Inhale 2 puffs into the lungs every 4 (four) hours as needed.  3 Inhaler  3  . calcium carbonate (TUMS - DOSED IN MG ELEMENTAL CALCIUM) 500 MG chewable tablet Chew 2 tablets by mouth daily. As needed daily for heartburn/indigestion       . fexofenadine-pseudoephedrine (ALLEGRA-D 24) 180-240 MG per 24 hr tablet Take 1 tablet by mouth daily.  90 tablet  3  . furosemide (LASIX) 40 MG tablet Take 1 tablet (40 mg total) by mouth daily.  90 tablet  3  . labetalol (NORMODYNE) 200 MG tablet Take 1 tablet (200 mg total) by mouth 2 (two) times daily.  180 tablet  1  . pantoprazole (PROTONIX) 40 MG tablet Take 1 tablet (40 mg total) by mouth daily.  90 tablet  3  . potassium chloride (KLOR-CON 10) 10 MEQ tablet Take 1 tablet (10 mEq total) by mouth daily.  90  tablet  3  . Prenatal Vit-Fe Fumarate-FA (PRENATAL PLUS/IRON) 27-1 MG TABS Take by mouth daily.          No Known Allergies  Family History  Problem Relation Age of Onset  . Hypertension Mother   . Hyperlipidemia Mother   . Hypertension Maternal Grandmother   . Hyperlipidemia Maternal Grandmother   . Hypertension Maternal Grandfather   . Hyperlipidemia Maternal Grandfather   . Lung cancer Maternal Grandfather     History   Social History  . Marital Status: Married    Spouse Name: N/A    Number of Children: 1  . Years of Education: N/A   Occupational History  . stay at home mom    Social History Main Topics  . Smoking status: Never Smoker   . Smokeless tobacco: Not on file  . Alcohol Use: No  . Drug Use: No  . Sexually Active: Not on file   Other Topics Concern  . Not on file   Social History Narrative  . No narrative on file     Constitutional: Denies fever, malaise, fatigue, headache or abrupt weight changes.  Musculoskeletal: Pt reports pain in her hands. Denies decrease in range of motion, difficulty with gait, muscle pain or joint pain and swelling.  Skin: Denies redness, rashes, lesions or ulcercations.  Neurological: Pt reports  numbness and tingling in her hands. Denies dizziness, difficulty with memory, difficulty with speech or problems with balance and coordination.   No other specific complaints in a complete review of systems (except as listed in HPI above).  Objective:   Physical Exam   BP 118/90  Pulse 83  Temp 98.1 F (36.7 C) (Oral)  Ht 5' (1.524 m)  Wt 195 lb (88.451 kg)  BMI 38.08 kg/m2  SpO2 97%  LMP 04/27/2012 Wt Readings from Last 3 Encounters:  06/05/12 195 lb (88.451 kg)  03/27/12 192 lb 6 oz (87.261 kg)  01/25/12 187 lb (84.823 kg)    General: Appears her stated age, well developed, well nourished in NAD. Skin: Warm, dry and intact. No rashes, lesions or ulcerations noted.  Cardiovascular: Normal rate and rhythm. S1,S2  noted.  No murmur, rubs or gallops noted. No JVD or BLE edema. No carotid bruits noted. Pulmonary/Chest: Normal effort and positive vesicular breath sounds. No respiratory distress. No wheezes, rales or ronchi noted.  Musculoskeletal: Normal range of motion. No signs of joint swelling. No difficulty with gait.  Neurological: Alert and oriented. Cranial nerves II-XII intact. Coordination normal. +DTRs bilaterally.     Assessment & Plan:   Paresthesias of bilateral upper extremities, new onset with additional workup required:  eRx given for Prednisone taper Will check folate If no better, may need to refer to Neurology for nerve conduction studies  RTC as needed or if symptoms persist

## 2012-06-05 NOTE — Patient Instructions (Signed)
Paresthesia  Paresthesia is an abnormal burning or prickling sensation. This sensation is generally felt in the hands, arms, legs, or feet. However, it may occur in any part of the body. It is usually not painful. The feeling may be described as:   Tingling or numbness.   "Pins and needles."   Skin crawling.   Buzzing.   Limbs "falling asleep."   Itching.  Most people experience temporary (transient) paresthesia at some time in their lives.  CAUSES   Paresthesia may occur when you breathe too quickly (hyperventilation). It can also occur without any apparent cause. Commonly, paresthesia occurs when pressure is placed on a nerve. The feeling quickly goes away once the pressure is removed. For some people, however, paresthesia is a long-lasting (chronic) condition caused by an underlying disorder. The underlying disorder may be:   A traumatic, direct injury to nerves. Examples include a:   Broken (fractured) neck.   Fractured skull.   A disorder affecting the brain and spinal cord (central nervous system). Examples include:   Transverse myelitis.   Encephalitis.   Transient ischemic attack.   Multiple sclerosis.   Stroke.   Tumor or blood vessel problems, such as an arteriovenous malformation pressing against the brain or spinal cord.   A condition that damages the peripheral nerves (peripheral neuropathy). Peripheral nerves are not part of the brain and spinal cord. These conditions include:   Diabetes.   Peripheral vascular disease.   Nerve entrapment syndromes, such as carpal tunnel syndrome.   Shingles.   Hypothyroidism.   Vitamin B12 deficiencies.   Alcoholism.   Heavy metal poisoning (lead, arsenic).   Rheumatoid arthritis.   Systemic lupus erythematosus.  DIAGNOSIS   Your caregiver will attempt to find the underlying cause of your paresthesia. Your caregiver may:   Take your medical history.   Perform a physical exam.   Order various lab tests.   Order imaging tests.  TREATMENT    Treatment for paresthesia depends on the underlying cause.  HOME CARE INSTRUCTIONS   Avoid drinking alcohol.   You may consider massage or acupuncture to help relieve your symptoms.   Keep all follow-up appointments as directed by your caregiver.  SEEK IMMEDIATE MEDICAL CARE IF:    You feel weak.   You have trouble walking or moving.   You have problems with speech or vision.   You feel confused.   You cannot control your bladder or bowel movements.   You feel numbness after an injury.   You faint.   Your burning or prickling feeling gets worse when walking.   You have pain, cramps, or dizziness.   You develop a rash.  MAKE SURE YOU:   Understand these instructions.   Will watch your condition.   Will get help right away if you are not doing well or get worse.  Document Released: 05/04/2002 Document Revised: 08/06/2011 Document Reviewed: 02/02/2011  [employer] Patient Information 2013 [employer].

## 2012-06-16 ENCOUNTER — Telehealth: Payer: Self-pay | Admitting: Internal Medicine

## 2012-06-16 NOTE — Telephone Encounter (Signed)
Pt is requesting a referral to Neurology. Still having symptoms in fingers and hands.

## 2012-06-16 NOTE — Telephone Encounter (Signed)
Patient informed and agreed to try the wrist splints.  Put in cabnet at the front desk.

## 2012-06-16 NOTE — Telephone Encounter (Signed)
On review of chart this seems c/w possible carpal tunnel bilat   Ok to finish the prednisone, but I would also try NIGHT time only bilat wrist splints (to take the pressure off the wrists)  This is commonly done, and if works would not then need further eval and tx  The next step would be to consider referral to Neurosurgeon or Hydrographic surveyor  Robin to check if have wrist splints

## 2012-07-16 ENCOUNTER — Encounter: Payer: Self-pay | Admitting: Internal Medicine

## 2012-07-16 ENCOUNTER — Ambulatory Visit (INDEPENDENT_AMBULATORY_CARE_PROVIDER_SITE_OTHER): Payer: Managed Care, Other (non HMO) | Admitting: Internal Medicine

## 2012-07-16 VITALS — BP 130/86 | HR 88 | Temp 97.3°F | Ht 60.0 in | Wt 199.2 lb

## 2012-07-16 DIAGNOSIS — J309 Allergic rhinitis, unspecified: Secondary | ICD-10-CM

## 2012-07-16 DIAGNOSIS — R209 Unspecified disturbances of skin sensation: Secondary | ICD-10-CM

## 2012-07-16 DIAGNOSIS — M79609 Pain in unspecified limb: Secondary | ICD-10-CM

## 2012-07-16 DIAGNOSIS — R202 Paresthesia of skin: Secondary | ICD-10-CM

## 2012-07-16 MED ORDER — FLUTICASONE PROPIONATE 50 MCG/ACT NA SUSP: 2.0000 | Freq: Every day | NASAL | Status: DC

## 2012-07-16 NOTE — Progress Notes (Signed)
Subjective:    Patient ID: Laura [name], female    DOB: 16-Aug-1975, 37 y.o.   MRN: [id number]  HPI  Pt presents to the clinic today with c/o an ear problem. She feels like her right ear is full. She also is hearing a pulsating sound in her right ear. It is not painful but more annoying. She does have problems with allergies, but does not take her allegra everyday. She denies fevers. Additionally, she is requesting a referral to a neurologist for nerve conduction studies. At her last visit (06/05/12) she had c/o paraesthesias in her bilateral hands. This has been going on for some time. She was prescribed a steroid taper. She did have some relief with that but she is still having the tingling and numbness in her bilateral hands.  Review of Systems      Past Medical History  Diagnosis Date  . ALLERGIC RHINITIS 06/01/2008  . Anxiety 08/15/2010  . ASTHMA 06/01/2008  . BURSITIS, LEFT HIP 04/28/2010  . GOITER, MULTINODULAR 06/28/2008  . Hirsutism 06/02/2009  . HYPERTENSION 06/01/2008  . Palpitations 07/25/2010  . PERIPHERAL EDEMA 04/28/2010  . TONSILLITIS, RECURRENT 05/05/2009  . Pharyngitis, acute 10/09/2010    Current Outpatient Prescriptions  Medication Sig Dispense Refill  . albuterol (PROVENTIL HFA;VENTOLIN HFA) 108 (90 BASE) MCG/ACT inhaler Inhale 2 puffs into the lungs every 4 (four) hours as needed.  3 Inhaler  3  . calcium carbonate (TUMS - DOSED IN MG ELEMENTAL CALCIUM) 500 MG chewable tablet Chew 2 tablets by mouth daily. As needed daily for heartburn/indigestion       . fexofenadine-pseudoephedrine (ALLEGRA-D 24) 180-240 MG per 24 hr tablet Take 1 tablet by mouth daily.  90 tablet  3  . furosemide (LASIX) 40 MG tablet Take 1 tablet (40 mg total) by mouth daily.  90 tablet  3  . labetalol (NORMODYNE) 200 MG tablet Take 1 tablet (200 mg total) by mouth 2 (two) times daily.  180 tablet  1  . pantoprazole (PROTONIX) 40 MG tablet Take 1 tablet (40 mg total) by mouth daily.  90 tablet  3   . potassium chloride (KLOR-CON 10) 10 MEQ tablet Take 1 tablet (10 mEq total) by mouth daily.  90 tablet  3  . predniSONE (DELTASONE) 20 MG tablet Take 2 tablets daily on days 1-3, Take 1 tablet daily on days 4-6, Take 1/2 tablet daily on days 7-9.  11 tablet  0  . Prenatal Vit-Fe Fumarate-FA (PRENATAL PLUS/IRON) 27-1 MG TABS Take by mouth daily.         No current facility-administered medications for this visit.    No Known Allergies  Family History  Problem Relation Age of Onset  . Hypertension Mother   . Hyperlipidemia Mother   . Hypertension Maternal Grandmother   . Hyperlipidemia Maternal Grandmother   . Hypertension Maternal Grandfather   . Hyperlipidemia Maternal Grandfather   . Lung cancer Maternal Grandfather     History   Social History  . Marital Status: Married    Spouse Name: N/A    Number of Children: 1  . Years of Education: N/A   Occupational History  . stay at home mom    Social History Main Topics  . Smoking status: Never Smoker   . Smokeless tobacco: Not on file  . Alcohol Use: No  . Drug Use: No  . Sexually Active: Not on file   Other Topics Concern  . Not on file   Social History Narrative  .  No narrative on file     Constitutional: Denies fever, malaise, fatigue, headache or abrupt weight changes.  HEENT: Pt reports right ear fullness. Denies eye pain, eye redness, ear pain, ringing in the ears, wax buildup, runny nose, nasal congestion, bloody nose, or sore throat. Musculoskeletal: Denies decrease in range of motion, difficulty with gait, muscle pain or joint pain and swelling.   Neurological: Pt reports paresthesias of her bilateral hands. Denies dizziness, difficulty with memory, difficulty with speech or problems with balance and coordination.   No other specific complaints in a complete review of systems (except as listed in HPI above).  Objective:   Physical Exam   BP 130/86  Pulse 88  Temp(Src) 97.3 F (36.3 C) (Oral)  Ht 5'  (1.524 m)  Wt 199 lb 4 oz (90.379 kg)  BMI 38.91 kg/m2  SpO2 99%  LMP 07/03/2012 Wt Readings from Last 3 Encounters:  07/16/12 199 lb 4 oz (90.379 kg)  06/05/12 195 lb (88.451 kg)  03/27/12 192 lb 6 oz (87.261 kg)    General: Appears her stated age, well developed, well nourished in NAD. HEENT: Head: normal shape and size; Eyes: sclera white, no icterus, conjunctiva pink, PERRLA and EOMs intact; Ears: Tm's gray and intact, disorted light reflex; Nose: mucosa pink and moist, septum midline; Throat/Mouth: Teeth present, mucosa pink and moist, no exudate, lesions or ulcerations noted.  Cardiovascular: Normal rate and rhythm. S1,S2 noted.  No murmur, rubs or gallops noted. No JVD or BLE edema. No carotid bruits noted. Pulmonary/Chest: Normal effort and positive vesicular breath sounds. No respiratory distress. No wheezes, rales or ronchi noted.  Neurological: Alert and oriented. Cranial nerves II-XII intact. Coordination normal. +DTRs bilaterally.       Assessment & Plan:   Paraesthesias, subsequent encounter with additional workup required:  Referral placed for Neurology to evaluate with nerve conduction studies  Allergic Rhinitis, additional workup required:  Start taking your allegra daily eRx for Flonase daily  RTC as needed or if symptoms persist

## 2012-07-16 NOTE — Patient Instructions (Signed)
Paresthesia  Paresthesia is an abnormal burning or prickling sensation. This sensation is generally felt in the hands, arms, legs, or feet. However, it may occur in any part of the body. It is usually not painful. The feeling may be described as:   Tingling or numbness.   "Pins and needles."   Skin crawling.   Buzzing.   Limbs "falling asleep."   Itching.  Most people experience temporary (transient) paresthesia at some time in their lives.  CAUSES   Paresthesia may occur when you breathe too quickly (hyperventilation). It can also occur without any apparent cause. Commonly, paresthesia occurs when pressure is placed on a nerve. The feeling quickly goes away once the pressure is removed. For some people, however, paresthesia is a long-lasting (chronic) condition caused by an underlying disorder. The underlying disorder may be:   A traumatic, direct injury to nerves. Examples include a:   Broken (fractured) neck.   Fractured skull.   A disorder affecting the brain and spinal cord (central nervous system). Examples include:   Transverse myelitis.   Encephalitis.   Transient ischemic attack.   Multiple sclerosis.   Stroke.   Tumor or blood vessel problems, such as an arteriovenous malformation pressing against the brain or spinal cord.   A condition that damages the peripheral nerves (peripheral neuropathy). Peripheral nerves are not part of the brain and spinal cord. These conditions include:   Diabetes.   Peripheral vascular disease.   Nerve entrapment syndromes, such as carpal tunnel syndrome.   Shingles.   Hypothyroidism.   Vitamin B12 deficiencies.   Alcoholism.   Heavy metal poisoning (lead, arsenic).   Rheumatoid arthritis.   Systemic lupus erythematosus.  DIAGNOSIS   Your caregiver will attempt to find the underlying cause of your paresthesia. Your caregiver may:   Take your medical history.   Perform a physical exam.   Order various lab tests.   Order imaging tests.  TREATMENT    Treatment for paresthesia depends on the underlying cause.  HOME CARE INSTRUCTIONS   Avoid drinking alcohol.   You may consider massage or acupuncture to help relieve your symptoms.   Keep all follow-up appointments as directed by your caregiver.  SEEK IMMEDIATE MEDICAL CARE IF:    You feel weak.   You have trouble walking or moving.   You have problems with speech or vision.   You feel confused.   You cannot control your bladder or bowel movements.   You feel numbness after an injury.   You faint.   Your burning or prickling feeling gets worse when walking.   You have pain, cramps, or dizziness.   You develop a rash.  MAKE SURE YOU:   Understand these instructions.   Will watch your condition.   Will get help right away if you are not doing well or get worse.  Document Released: 05/04/2002 Document Revised: 08/06/2011 Document Reviewed: 02/02/2011  [employer] Patient Information 2013 [employer].

## 2012-09-03 ENCOUNTER — Encounter: Payer: Self-pay | Admitting: Diagnostic Neuroimaging

## 2012-09-03 ENCOUNTER — Ambulatory Visit: Payer: Self-pay | Admitting: Diagnostic Neuroimaging

## 2012-09-03 ENCOUNTER — Encounter: Payer: Self-pay | Admitting: Radiology

## 2012-09-10 ENCOUNTER — Encounter: Payer: Self-pay | Admitting: Radiology

## 2012-09-10 ENCOUNTER — Encounter: Payer: Self-pay | Admitting: Diagnostic Neuroimaging

## 2012-12-12 ENCOUNTER — Encounter: Payer: Self-pay | Admitting: Internal Medicine

## 2012-12-12 ENCOUNTER — Ambulatory Visit (INDEPENDENT_AMBULATORY_CARE_PROVIDER_SITE_OTHER): Payer: Managed Care, Other (non HMO) | Admitting: Internal Medicine

## 2012-12-12 VITALS — BP 110/72 | HR 86 | Temp 98.7°F | Ht 61.0 in | Wt 205.1 lb

## 2012-12-12 DIAGNOSIS — J029 Acute pharyngitis, unspecified: Secondary | ICD-10-CM

## 2012-12-12 DIAGNOSIS — I10 Essential (primary) hypertension: Secondary | ICD-10-CM

## 2012-12-12 DIAGNOSIS — E042 Nontoxic multinodular goiter: Secondary | ICD-10-CM

## 2012-12-12 DIAGNOSIS — Z0000 Encounter for general adult medical examination without abnormal findings: Secondary | ICD-10-CM

## 2012-12-12 MED ORDER — LABETALOL HCL 200 MG PO TABS: 200.0000 mg | ORAL_TABLET | Freq: Two times a day (BID) | ORAL | Status: DC

## 2012-12-12 MED ORDER — AZITHROMYCIN 250 MG PO TABS: ORAL_TABLET | ORAL | Status: DC

## 2012-12-12 NOTE — Assessment & Plan Note (Signed)
stable overall by history and exam, recent data reviewed with pt, and pt to continue medical treatment as before,  to f/u any worsening symptoms or concerns BP Readings from Last 3 Encounters:  12/12/12 110/72  07/16/12 130/86  06/05/12 118/90

## 2012-12-12 NOTE — Assessment & Plan Note (Signed)
Mild to mod, for antibx course,  to f/u any worsening symptoms or concerns 

## 2012-12-12 NOTE — Assessment & Plan Note (Signed)
Will need f/u with endo, will refer back

## 2012-12-12 NOTE — Progress Notes (Signed)
Subjective:    Patient ID: Laura [name], female    DOB: 07/21/1975, 37 y.o.   MRN: [id number]  HPI   Here with 2-3 days acute onset fever, severe ST pain, pressure, headache, general weakness and malaise, and greenish d/c, with mild ST non cough, but pt denies chest pain, wheezing, increased sob or doe, orthopnea, PND, increased LE swelling, palpitations, dizziness or syncope.  Pt denies polydipsia, polyuria. Pt denies new neurological symptoms such as new headache, or facial or extremity weakness or numbness Has ongoing multinodular goiter, due for f/u next mo, wondering if has something to do with her ST, no trouble swallowing. Past Medical History  Diagnosis Date  . ALLERGIC RHINITIS 06/01/2008  . Anxiety 08/15/2010  . ASTHMA 06/01/2008  . BURSITIS, LEFT HIP 04/28/2010  . GOITER, MULTINODULAR 06/28/2008  . Hirsutism 06/02/2009  . HYPERTENSION 06/01/2008  . Palpitations 07/25/2010  . PERIPHERAL EDEMA 04/28/2010  . TONSILLITIS, RECURRENT 05/05/2009  . Pharyngitis, acute 10/09/2010   Past Surgical History  Procedure Laterality Date  . Cesarean section  2007  . S/p sinus surgury  2006    reports that she has never smoked. She does not have any smokeless tobacco history on file. She reports that she does not drink alcohol or use illicit drugs. family history includes Hyperlipidemia in her maternal grandfather, maternal grandmother, and mother; Hypertension in her maternal grandfather, maternal grandmother, and mother; and Lung cancer in her maternal grandfather. No Known Allergies Current Outpatient Prescriptions on File Prior to Visit  Medication Sig Dispense Refill  . albuterol (PROVENTIL HFA;VENTOLIN HFA) 108 (90 BASE) MCG/ACT inhaler Inhale 2 puffs into the lungs every 4 (four) hours as needed.  3 Inhaler  3  . calcium carbonate (TUMS - DOSED IN MG ELEMENTAL CALCIUM) 500 MG chewable tablet Chew 2 tablets by mouth daily. As needed daily for heartburn/indigestion       . fluticasone (FLONASE)  50 MCG/ACT nasal spray Place 2 sprays into the nose daily.  16 g  6  . furosemide (LASIX) 40 MG tablet Take 1 tablet (40 mg total) by mouth daily.  90 tablet  3  . potassium chloride (KLOR-CON 10) 10 MEQ tablet Take 1 tablet (10 mEq total) by mouth daily.  90 tablet  3  . Prenatal Vit-Fe Fumarate-FA (PRENATAL PLUS/IRON) 27-1 MG TABS Take by mouth daily.        . pantoprazole (PROTONIX) 40 MG tablet Take 1 tablet (40 mg total) by mouth daily.  90 tablet  3   No current facility-administered medications on file prior to visit.   Review of Systems  Constitutional: Negative for unexpected weight change, or unusual diaphoresis  HENT: Negative for tinnitus.   Eyes: Negative for photophobia and visual disturbance.  Respiratory: Negative for choking and stridor.   Gastrointestinal: Negative for vomiting and blood in stool.  Genitourinary: Negative for hematuria and decreased urine volume.  Musculoskeletal: Negative for acute joint swelling Skin: Negative for color change and wound.  Neurological: Negative for tremors and numbness other than noted  Psychiatric/Behavioral: Negative for decreased concentration or  hyperactivity.       Objective:   Physical Exam BP 110/72  Pulse 86  Temp(Src) 98.7 F (37.1 C) (Oral)  Ht 5\' 1"  (1.549 m)  Wt 205 lb 2 oz (93.044 kg)  BMI 38.78 kg/m2  SpO2 96% VS noted, mild ill Constitutional: Pt appears well-developed and well-nourished.  HENT: Head: NCAT.  Right Ear: External ear normal.  Left Ear: External ear normal.  Bilat  tm's with mild erythema.  Max sinus areas non tender.  Pharynx with mod erythema, + exudate Eyes: Conjunctivae and EOM are normal. Pupils are equal, round, and reactive to light.  Neck: Normal range of motion. Neck supple. very large nodular goiter noted, NT Cardiovascular: Normal rate and regular rhythm.   Pulmonary/Chest: Effort normal and breath sounds normal.  Neurological: Pt is alert. Not confused  Skin: Skin is warm. No  erythema.  Psychiatric: Pt behavior is normal. Thought content normal.     Assessment & Plan:

## 2012-12-12 NOTE — Patient Instructions (Signed)
Please take all new medication as prescribed - the antibiotic Please continue all other medications as before Please have the pharmacy call with any other refills you may need. You will be contacted regarding the referral for: Dr Everardo All for late August 2014  Please remember to sign up for My Chart if you have not done so, as this will be important to you in the future with finding out test results, communicating by private email, and scheduling acute appointments online when needed.  Please return in 6 months, or sooner if needed, with Lab testing done 3-5 days before

## 2013-01-04 ENCOUNTER — Other Ambulatory Visit: Payer: Self-pay | Admitting: Internal Medicine

## 2013-01-14 ENCOUNTER — Ambulatory Visit: Payer: Managed Care, Other (non HMO) | Admitting: Endocrinology

## 2013-01-14 DIAGNOSIS — Z0289 Encounter for other administrative examinations: Secondary | ICD-10-CM

## 2013-03-23 ENCOUNTER — Ambulatory Visit (INDEPENDENT_AMBULATORY_CARE_PROVIDER_SITE_OTHER): Payer: Managed Care, Other (non HMO) | Admitting: Internal Medicine

## 2013-03-23 ENCOUNTER — Encounter: Payer: Self-pay | Admitting: Internal Medicine

## 2013-03-23 VITALS — BP 128/84 | HR 93 | Temp 98.3°F | Ht 60.0 in | Wt 198.0 lb

## 2013-03-23 DIAGNOSIS — J029 Acute pharyngitis, unspecified: Secondary | ICD-10-CM

## 2013-03-23 MED ORDER — AZITHROMYCIN 250 MG PO TABS: ORAL_TABLET | ORAL | Status: DC

## 2013-03-23 NOTE — Progress Notes (Signed)
HPI: Pt presents to the office today with complaints of sore throat for 48 hrs. Pt states she has chronic issues with tonsillitis vs pharyngitis? In which sees an ENT specialist.  She endorses throat pain with swallowing, hoarseness, swelling right > left, and nasal congestion. She denies fever, chills, fatigue, malaise, nasal discharge, or ear pain. Pt states she was recently treated by Dr. Jonny Ruiz with a Zpack and requests the same thing this time.   Past Medical History  Diagnosis Date  . ALLERGIC RHINITIS 06/01/2008  . Anxiety 08/15/2010  . ASTHMA 06/01/2008  . BURSITIS, LEFT HIP 04/28/2010  . GOITER, MULTINODULAR 06/28/2008  . Hirsutism 06/02/2009  . HYPERTENSION 06/01/2008  . Palpitations 07/25/2010  . PERIPHERAL EDEMA 04/28/2010  . TONSILLITIS, RECURRENT 05/05/2009  . Pharyngitis, acute 10/09/2010    Current Outpatient Prescriptions  Medication Sig Dispense Refill  . albuterol (PROVENTIL HFA;VENTOLIN HFA) 108 (90 BASE) MCG/ACT inhaler Inhale 2 puffs into the lungs every 4 (four) hours as needed.  3 Inhaler  3  . azithromycin (ZITHROMAX Z-PAK) 250 MG tablet Use as directed  6 each  0  . calcium carbonate (TUMS - DOSED IN MG ELEMENTAL CALCIUM) 500 MG chewable tablet Chew 2 tablets by mouth daily. As needed daily for heartburn/indigestion       . fluticasone (FLONASE) 50 MCG/ACT nasal spray Place 2 sprays into the nose daily.  16 g  6  . furosemide (LASIX) 40 MG tablet Take 1 tablet (40 mg total) by mouth daily.  90 tablet  3  . labetalol (NORMODYNE) 200 MG tablet Take 1 tablet (200 mg total) by mouth 2 (two) times daily.  180 tablet  3  . labetalol (NORMODYNE) 200 MG tablet TAKE 1 TABLET BY MOUTH TWICE DAILY  60 tablet  11  . potassium chloride (KLOR-CON 10) 10 MEQ tablet Take 1 tablet (10 mEq total) by mouth daily.  90 tablet  3  . Prenatal Vit-Fe Fumarate-FA (PRENATAL PLUS/IRON) 27-1 MG TABS Take by mouth daily.        . pantoprazole (PROTONIX) 40 MG tablet Take 1 tablet (40 mg total) by mouth daily.   90 tablet  3   No current facility-administered medications for this visit.    No Known Allergies ROS:  Constitutional: Denies fever, malaise, fatigue, headache or abrupt weight changes.  HEENT: Endorses sore throat and nasal congestion.  Denies eye pain, eye redness, ear pain, ringing in the ears, wax buildup, or nasal discharge. Respiratory: Denies difficulty breathing, shortness of breath, cough or sputum production.   Cardiovascular: Denies chest pain, chest tightness, palpitations or swelling in the hands or feet.    No other specific complaints in a complete review of systems (except as listed in HPI above).  PE:  BP 128/84  Pulse 93  Temp(Src) 98.3 F (36.8 C) (Oral)  Ht 5' (1.524 m)  Wt 198 lb (89.812 kg)  BMI 38.67 kg/m2  SpO2 97% Wt Readings from Last 3 Encounters:  03/23/13 198 lb (89.812 kg)  12/12/12 205 lb 2 oz (93.044 kg)  07/16/12 199 lb 4 oz (90.379 kg)    General: Appears their stated age, well developed, well nourished in NAD. HEENT: Head: normal shape and size; Eyes: sclera white, no icterus, conjunctiva pink, PERRLA and EOMs intact; Ears: Tm's gray and intact, normal light reflex; Nose: mucosa pink and moist, septum midline; Throat/Mouth: Teeth present, mucosa pink and moist, throat erythema right > left; no lesions or ulcerations noted.  Neck: Normal range of motion. Neck supple,  trachea midline. Anterior cervical lymphadenopathy, >1cm, mobile, and mildly tenderNo massses, lumps or thyromegaly present.  Cardiovascular: Normal rate and rhythm. S1,S2 noted.  No murmur, rubs or gallops noted. No JVD or BLE edema. No carotid bruits noted. Pulmonary/Chest: Normal effort and positive vesicular breath sounds. No respiratory distress. No wheezes, rales or ronchi noted.  Psychiatric: Mood and affect normal. Behavior is normal. Judgment and thought content normal.      Assessment and Plan: Bacterial vs. Viral Pharyngitis Prescribed Zpak; please take as  instructed Recommended gargling with warm salt water Follow up in 3-5 days if symptoms do not improve or worsen  Khriz Liddy S, Student-NP

## 2013-03-23 NOTE — Progress Notes (Signed)
HPI  Pt presents to the clinic today with c/o sore throat. This started 1 day ago. She denies fever, chills or body aches. She has not taken anything OTC. She does have a history of recurrent pharyngitis. She does see ENT for this but they have not yet taken her tonsils out because her tonsils are not interfering with her sleep.  Review of Systems      Past Medical History  Diagnosis Date  . ALLERGIC RHINITIS 06/01/2008  . Anxiety 08/15/2010  . ASTHMA 06/01/2008  . BURSITIS, LEFT HIP 04/28/2010  . GOITER, MULTINODULAR 06/28/2008  . Hirsutism 06/02/2009  . HYPERTENSION 06/01/2008  . Palpitations 07/25/2010  . PERIPHERAL EDEMA 04/28/2010  . TONSILLITIS, RECURRENT 05/05/2009  . Pharyngitis, acute 10/09/2010    Family History  Problem Relation Age of Onset  . Hypertension Mother   . Hyperlipidemia Mother   . Hypertension Maternal Grandmother   . Hyperlipidemia Maternal Grandmother   . Hypertension Maternal Grandfather   . Hyperlipidemia Maternal Grandfather   . Lung cancer Maternal Grandfather     History   Social History  . Marital Status: Married    Spouse Name: N/A    Number of Children: 1  . Years of Education: N/A   Occupational History  . stay at home mom    Social History Main Topics  . Smoking status: Never Smoker   . Smokeless tobacco: Not on file  . Alcohol Use: No  . Drug Use: No  . Sexual Activity: Not on file   Other Topics Concern  . Not on file   Social History Narrative  . No narrative on file    No Known Allergies   Constitutional: Denies headache, fatigue, fever or abrupt weight changes.  HEENT:  Positive sore throat. Denies eye redness, eye pain, pressure behind the eyes, facial pain, nasal congestion, ear pain, ringing in the ears, wax buildup, runny nose or bloody nose. Respiratory:  Denies cough, difficulty breathing or shortness of breath.  Cardiovascular: Denies chest pain, chest tightness, palpitations or swelling in the hands or feet.   No  other specific complaints in a complete review of systems (except as listed in HPI above).  Objective:   BP 128/84  Pulse 93  Temp(Src) 98.3 F (36.8 C) (Oral)  Ht 5' (1.524 m)  Wt 198 lb (89.812 kg)  BMI 38.67 kg/m2  SpO2 97% Wt Readings from Last 3 Encounters:  03/23/13 198 lb (89.812 kg)  12/12/12 205 lb 2 oz (93.044 kg)  07/16/12 199 lb 4 oz (90.379 kg)     General: Appears her stated age, well developed, well nourished in NAD. HEENT: Head: normal shape and size; Eyes: sclera white, no icterus, conjunctiva pink, PERRLA and EOMs intact; Ears: Tm's gray and intact, normal light reflex; Nose: mucosa pink and moist, septum midline; Throat/Mouth: Teeth present, mucosa erythematous and moist, no exudate noted, no lesions or ulcerations noted.  Neck: Mild cervical lymphadenopathy. Neck supple, trachea midline. No massses, lumps or thyromegaly present.  Cardiovascular: Normal rate and rhythm. S1,S2 noted.  No murmur, rubs or gallops noted. No JVD or BLE edema. No carotid bruits noted. Pulmonary/Chest: Normal effort and positive vesicular breath sounds. No respiratory distress. No wheezes, rales or ronchi noted.      Assessment & Plan:   Pharyngitis, likely viral but recurrant:  Get some rest and drink plenty of water Do salt water gargles for the sore throat eRx for Azithromax x 5 days    RTC as needed or if  symptoms persist.

## 2013-03-23 NOTE — Patient Instructions (Signed)

## 2013-03-26 ENCOUNTER — Encounter: Payer: Self-pay | Admitting: Internal Medicine

## 2013-03-26 ENCOUNTER — Ambulatory Visit (INDEPENDENT_AMBULATORY_CARE_PROVIDER_SITE_OTHER): Payer: Managed Care, Other (non HMO) | Admitting: Internal Medicine

## 2013-03-26 VITALS — BP 110/70 | HR 101 | Temp 97.5°F | Ht 61.0 in | Wt 198.5 lb

## 2013-03-26 DIAGNOSIS — I10 Essential (primary) hypertension: Secondary | ICD-10-CM

## 2013-03-26 DIAGNOSIS — J029 Acute pharyngitis, unspecified: Secondary | ICD-10-CM

## 2013-03-26 DIAGNOSIS — J45909 Unspecified asthma, uncomplicated: Secondary | ICD-10-CM

## 2013-03-26 MED ORDER — SULFAMETHOXAZOLE-TRIMETHOPRIM 800-160 MG PO TABS: 1.0000 | ORAL_TABLET | Freq: Two times a day (BID) | ORAL | Status: DC

## 2013-03-26 MED ORDER — NAPROXEN 500 MG PO TABS: 500.0000 mg | ORAL_TABLET | Freq: Two times a day (BID) | ORAL | Status: DC

## 2013-03-26 NOTE — Patient Instructions (Signed)
Please take all new medication as prescribed - the antibiotic and the pain medication Please continue all other medications as before

## 2013-03-26 NOTE — Progress Notes (Signed)
Subjective:    Patient ID: Laura [name], female    DOB: 1975-08-09, 37 y.o.   MRN: [id number]  HPI    Here to fu, unfort ST pain not better, only worse it seems after seen oct 27;  Here with 5 days acute onset fever, ST pain, pressure, headache, general weakness and malaise, and mild cough, but pt denies chest pain, wheezing, increased sob or doe, orthopnea, PND, increased LE swelling, palpitations, dizziness or syncope.  Pt denies new neurological symptoms such as new headache, or facial or extremity weakness or numbness   Pt denies polydipsia, polyuria, Not pregnant, married, uses condoms. LMP last wk Past Medical History  Diagnosis Date  . ALLERGIC RHINITIS 06/01/2008  . Anxiety 08/15/2010  . ASTHMA 06/01/2008  . BURSITIS, LEFT HIP 04/28/2010  . GOITER, MULTINODULAR 06/28/2008  . Hirsutism 06/02/2009  . HYPERTENSION 06/01/2008  . Palpitations 07/25/2010  . PERIPHERAL EDEMA 04/28/2010  . TONSILLITIS, RECURRENT 05/05/2009  . Pharyngitis, acute 10/09/2010   Past Surgical History  Procedure Laterality Date  . Cesarean section  2007  . S/p sinus surgury  2006    reports that she has never smoked. She does not have any smokeless tobacco history on file. She reports that she does not drink alcohol or use illicit drugs. family history includes Hyperlipidemia in her maternal grandfather, maternal grandmother, and mother; Hypertension in her maternal grandfather, maternal grandmother, and mother; Lung cancer in her maternal grandfather. No Known Allergies Current Outpatient Prescriptions on File Prior to Visit  Medication Sig Dispense Refill  . albuterol (PROVENTIL HFA;VENTOLIN HFA) 108 (90 BASE) MCG/ACT inhaler Inhale 2 puffs into the lungs every 4 (four) hours as needed.  3 Inhaler  3  . azithromycin (ZITHROMAX Z-PAK) 250 MG tablet Use as directed  6 each  0  . calcium carbonate (TUMS - DOSED IN MG ELEMENTAL CALCIUM) 500 MG chewable tablet Chew 2 tablets by mouth daily. As needed daily for  heartburn/indigestion       . fluticasone (FLONASE) 50 MCG/ACT nasal spray Place 2 sprays into the nose daily.  16 g  6  . furosemide (LASIX) 40 MG tablet Take 1 tablet (40 mg total) by mouth daily.  90 tablet  3  . labetalol (NORMODYNE) 200 MG tablet Take 1 tablet (200 mg total) by mouth 2 (two) times daily.  180 tablet  3  . labetalol (NORMODYNE) 200 MG tablet TAKE 1 TABLET BY MOUTH TWICE DAILY  60 tablet  11  . potassium chloride (KLOR-CON 10) 10 MEQ tablet Take 1 tablet (10 mEq total) by mouth daily.  90 tablet  3  . Prenatal Vit-Fe Fumarate-FA (PRENATAL PLUS/IRON) 27-1 MG TABS Take by mouth daily.        . pantoprazole (PROTONIX) 40 MG tablet Take 1 tablet (40 mg total) by mouth daily.  90 tablet  3   No current facility-administered medications on file prior to visit.     Review of Systems  Constitutional: Negative for unexpected weight change, or unusual diaphoresis  HENT: Negative for tinnitus.   Eyes: Negative for photophobia and visual disturbance.  Respiratory: Negative for choking and stridor.   Gastrointestinal: Negative for vomiting and blood in stool.  Genitourinary: Negative for hematuria and decreased urine volume.  Musculoskeletal: Negative for acute joint swelling Skin: Negative for color change and wound.  Neurological: Negative for tremors and numbness other than noted  Psychiatric/Behavioral: Negative for decreased concentration or  hyperactivity.       Objective:   Physical Exam  BP 110/70  Pulse 101  Temp(Src) 97.5 F (36.4 C) (Oral)  Ht 5\' 1"  (1.549 m)  Wt 198 lb 8 oz (90.039 kg)  BMI 37.53 kg/m2  SpO2 97% VS noted, mild ill Constitutional: Pt appears well-developed and well-nourished.  HENT: Head: NCAT.  Right Ear: External ear normal.  Left Ear: External ear normal.  Eyes: Conjunctivae and EOM are normal. Pupils are equal, round, and reactive to light.  Bilat tm's with mild erythema.  Max sinus areas mild tender.  Pharynx with severe erythema, no  exudate Neck: Normal range of motion. Neck supple.  Cardiovascular: Normal rate and regular rhythm.   Pulmonary/Chest: Effort normal and breath sounds normal. - no wheezing Abd:  Soft, NT, non-distended, + BS Neurological: Pt is alert. Not confused  Skin: Skin is warm. No erythema.  Psychiatric: Pt behavior is normal. Thought content normal.     Assessment & Plan:

## 2013-03-27 ENCOUNTER — Telehealth: Payer: Self-pay | Admitting: Internal Medicine

## 2013-03-27 NOTE — Telephone Encounter (Signed)
Called pt back she stated she went ahead and stop the Z-pack that she was taking, and started taking the septra md rx yesterday. Just wanted to make sure she didn't have to take both. Inform pt that was correct only take the septra that he rx...lmb

## 2013-03-27 NOTE — Telephone Encounter (Signed)
03/27/2013  Pt left message with med concerns.  Please ct pt.

## 2013-03-29 NOTE — Assessment & Plan Note (Signed)
mod, for antibx course,  to f/u any worsening symptoms or concerns 

## 2013-03-29 NOTE — Assessment & Plan Note (Signed)
stable overall by history and exam, recent data reviewed with pt, and pt to continue medical treatment as before,  to f/u any worsening symptoms or concerns SpO2 Readings from Last 3 Encounters:  03/26/13 97%  03/23/13 97%  12/12/12 96%

## 2013-03-29 NOTE — Assessment & Plan Note (Signed)
stable overall by history and exam, recent data reviewed with pt, and pt to continue medical treatment as before,  to f/u any worsening symptoms or concerns BP Readings from Last 3 Encounters:  03/26/13 110/70  03/23/13 128/84  12/12/12 110/72

## 2013-04-10 ENCOUNTER — Telehealth: Payer: Self-pay

## 2013-04-10 MED ORDER — FUROSEMIDE 40 MG PO TABS: 40.0000 mg | ORAL_TABLET | Freq: Every day | ORAL | Status: DC

## 2013-04-10 MED ORDER — POTASSIUM CHLORIDE ER 10 MEQ PO TBCR: 10.0000 meq | EXTENDED_RELEASE_TABLET | Freq: Every day | ORAL | Status: DC

## 2013-04-10 NOTE — Telephone Encounter (Signed)
Refills sent to new mail order [facility]

## 2013-04-29 IMAGING — CR DG CHEST 2V
2 series · 2 of 2 positions shown · non-contrast
Comparison: None.

CLINICAL DATA: Chest pain.  Nonsmoker.  Hypertension.

CHEST - 2 VIEW

[view not recorded (1 of 2)]
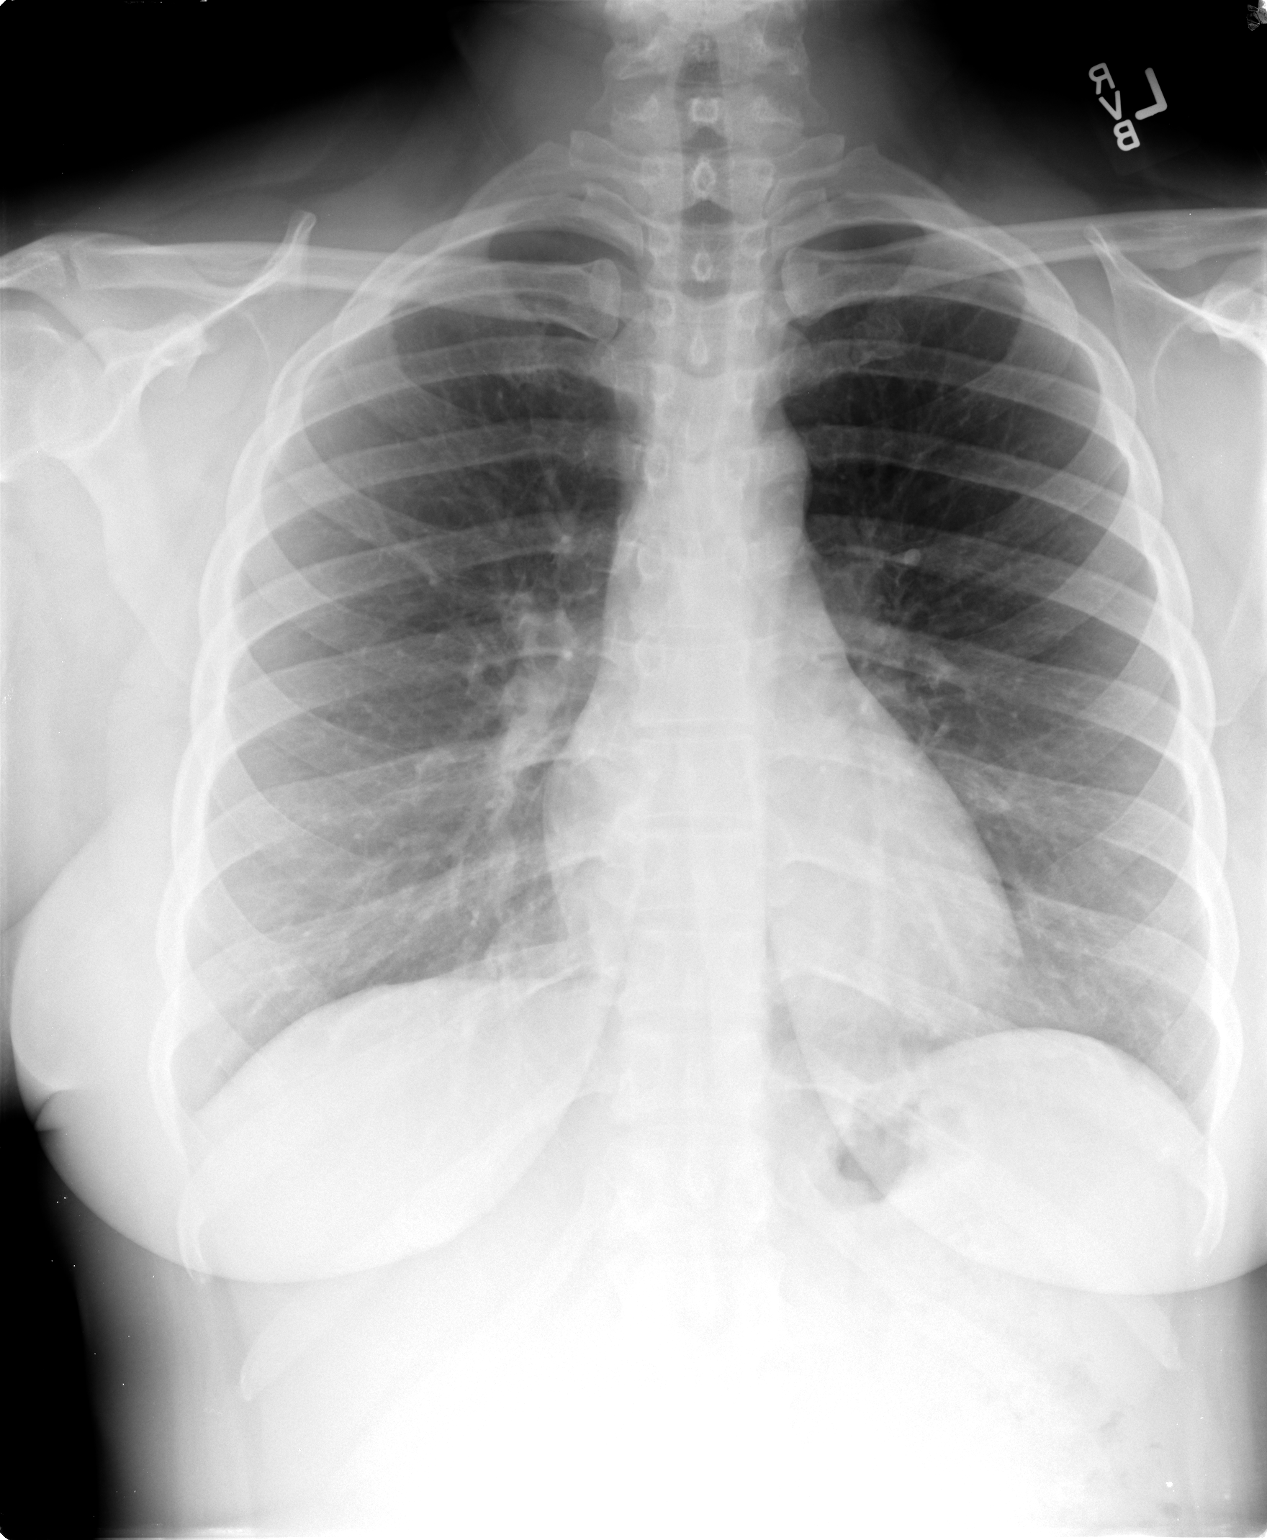

[view not recorded (2 of 2)]
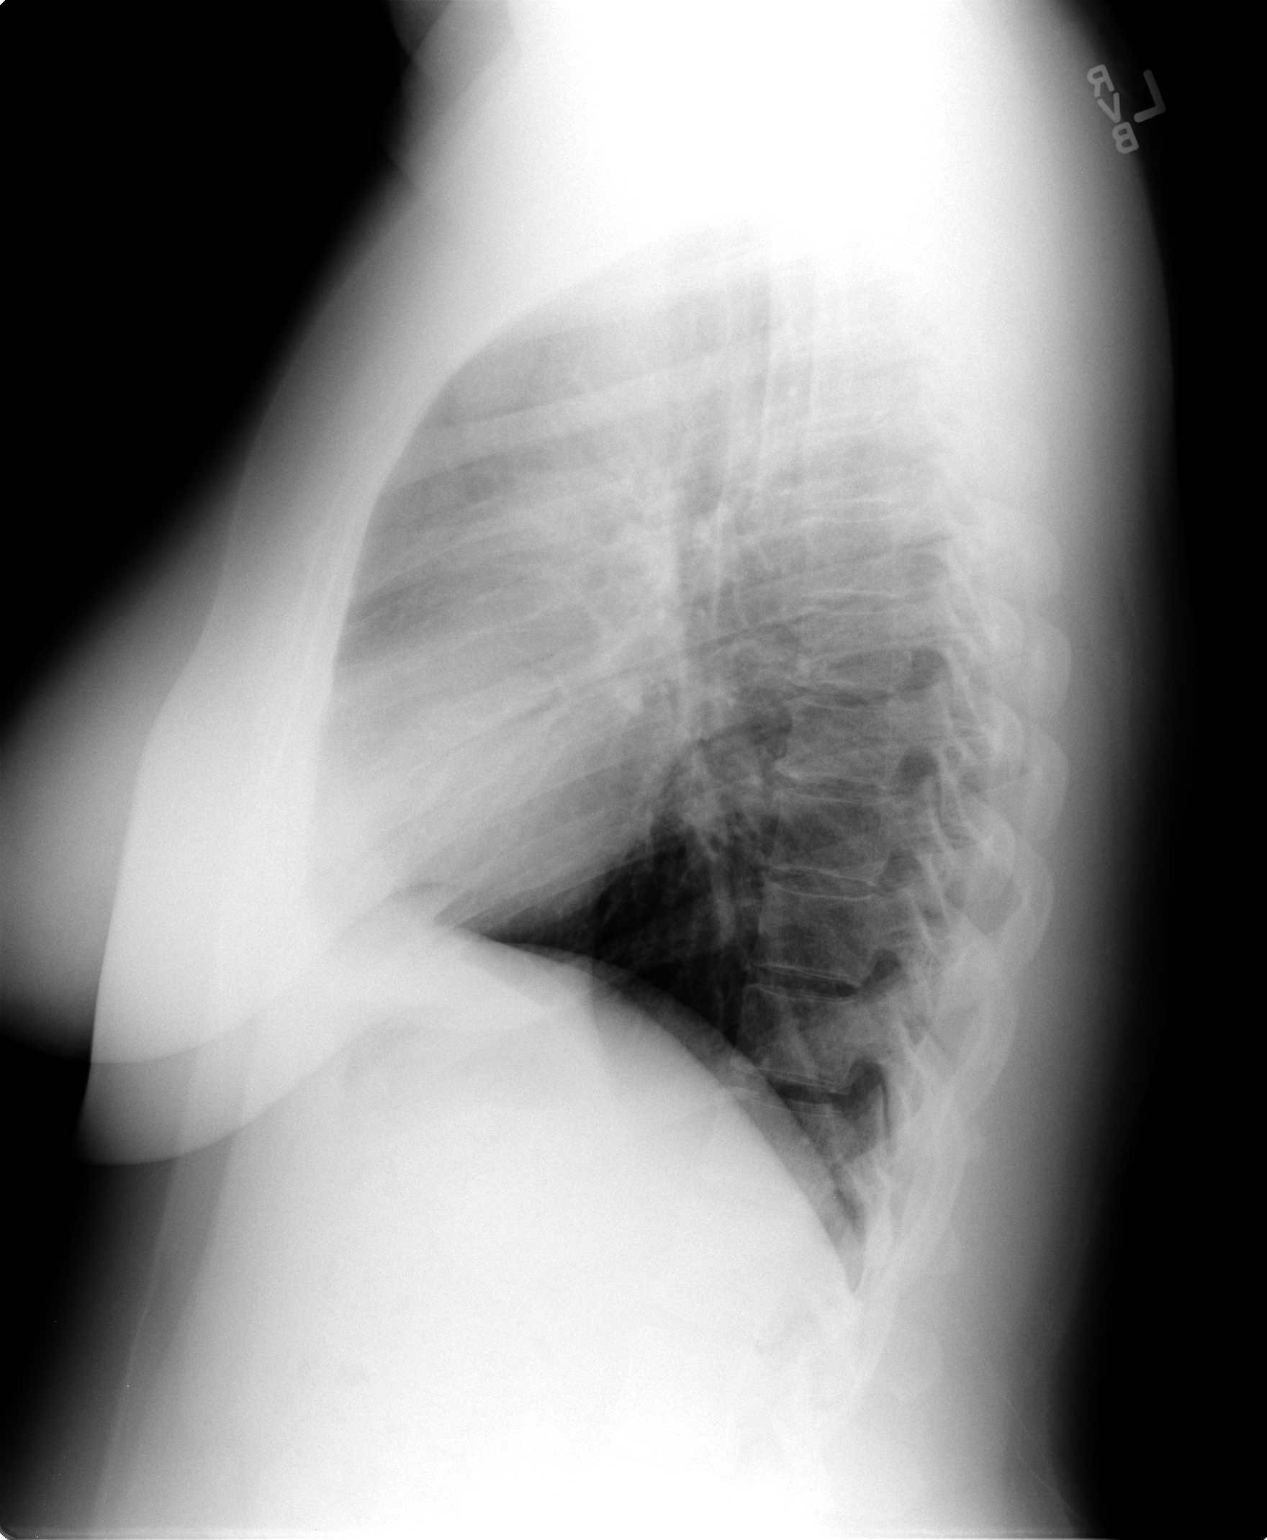

[2 of 2 positions shown; findings below may reference images not displayed]

FINDINGS: Midline trachea.  Normal heart size and mediastinal
contours. No pleural effusion or pneumothorax.  Clear lungs.
IMPRESSION: Normal chest.

## 2013-06-23 ENCOUNTER — Ambulatory Visit (INDEPENDENT_AMBULATORY_CARE_PROVIDER_SITE_OTHER): Payer: Managed Care, Other (non HMO) | Admitting: Endocrinology

## 2013-06-23 ENCOUNTER — Encounter: Payer: Self-pay | Admitting: Endocrinology

## 2013-06-23 VITALS — BP 116/74 | HR 72 | Temp 98.2°F | Ht 61.0 in | Wt 194.0 lb

## 2013-06-23 DIAGNOSIS — L680 Hirsutism: Secondary | ICD-10-CM

## 2013-06-23 DIAGNOSIS — E042 Nontoxic multinodular goiter: Secondary | ICD-10-CM

## 2013-06-23 LAB — GLUCOSE, RANDOM: Glucose, Bld: 94 mg/dL (ref 70–99)

## 2013-06-23 LAB — T4, FREE: Free T4: 0.95 ng/dL (ref 0.60–1.60)

## 2013-06-23 LAB — TSH: TSH: 0.24 u[IU]/mL — ABNORMAL LOW (ref 0.35–5.50)

## 2013-06-23 NOTE — Progress Notes (Signed)
Subjective:    Patient ID: Laura [name], female    DOB: 09-25-1975, 38 y.o.   MRN: [id number]  HPI Pt returns for f/u of small multinodular goiter (dx'ed 2007, on routine physical exam; [place] have intermittently shown small nodules). She has slight thinning of the hair, throughout the head, and assoc fullness at the anterior neck. Past Medical History  Diagnosis Date  . ALLERGIC RHINITIS 06/01/2008  . Anxiety 08/15/2010  . ASTHMA 06/01/2008  . BURSITIS, LEFT HIP 04/28/2010  . GOITER, MULTINODULAR 06/28/2008  . Hirsutism 06/02/2009  . HYPERTENSION 06/01/2008  . Palpitations 07/25/2010  . PERIPHERAL EDEMA 04/28/2010  . TONSILLITIS, RECURRENT 05/05/2009  . Pharyngitis, acute 10/09/2010    Past Surgical History  Procedure Laterality Date  . Cesarean section  2007  . S/p sinus surgury  2006    History   Social History  . Marital Status: Married    Spouse Name: N/A    Number of Children: 1  . Years of Education: N/A   Occupational History  . stay at home mom    Social History Main Topics  . Smoking status: Never Smoker   . Smokeless tobacco: Not on file  . Alcohol Use: No  . Drug Use: No  . Sexual Activity: Not on file   Other Topics Concern  . Not on file   Social History Narrative  . No narrative on file    Current Outpatient Prescriptions on File Prior to Visit  Medication Sig Dispense Refill  . albuterol (PROVENTIL HFA;VENTOLIN HFA) 108 (90 BASE) MCG/ACT inhaler Inhale 2 puffs into the lungs every 4 (four) hours as needed.  3 Inhaler  3  . azithromycin (ZITHROMAX Z-PAK) 250 MG tablet Use as directed  6 each  0  . calcium carbonate (TUMS - DOSED IN MG ELEMENTAL CALCIUM) 500 MG chewable tablet Chew 2 tablets by mouth daily. As needed daily for heartburn/indigestion       . fluticasone (FLONASE) 50 MCG/ACT nasal spray Place 2 sprays into the nose daily.  16 g  6  . furosemide (LASIX) 40 MG tablet Take 1 tablet (40 mg total) by mouth daily.  90 tablet  3  . labetalol  (NORMODYNE) 200 MG tablet Take 1 tablet (200 mg total) by mouth 2 (two) times daily.  180 tablet  3  . naproxen (NAPROSYN) 500 MG tablet Take 1 tablet (500 mg total) by mouth 2 (two) times daily with a meal.  60 tablet  2  . potassium chloride (KLOR-CON 10) 10 MEQ tablet Take 1 tablet (10 mEq total) by mouth daily.  90 tablet  3  . Prenatal Vit-Fe Fumarate-FA (PRENATAL PLUS/IRON) 27-1 MG TABS Take by mouth daily.        Marland Kitchen sulfamethoxazole-trimethoprim (SEPTRA DS) 800-160 MG per tablet Take 1 tablet by mouth 2 (two) times daily.  20 tablet  0  . pantoprazole (PROTONIX) 40 MG tablet Take 1 tablet (40 mg total) by mouth daily.  90 tablet  3   No current facility-administered medications on file prior to visit.    No Known Allergies  Family History  Problem Relation Age of Onset  . Hypertension Mother   . Hyperlipidemia Mother   . Hypertension Maternal Grandmother   . Hyperlipidemia Maternal Grandmother   . Hypertension Maternal Grandfather   . Hyperlipidemia Maternal Grandfather   . Lung cancer Maternal Grandfather     BP 116/74  Pulse 72  Temp(Src) 98.2 F (36.8 C) (Oral)  Ht 5\' 1"  (1.549  m)  Wt 194 lb (87.998 kg)  BMI 36.67 kg/m2  SpO2 98%   Review of Systems Denies tremor and weight change    Objective:   Physical Exam VITAL SIGNS:  See vs page GENERAL: no distress Neck: thyroid is slightly and diffusely enlarged.   Lab Results  Component Value Date   TSH 0.24* 06/23/2013      Assessment & Plan:  Multinodular goiter, small. Hyperthyroidism, due to the goiter, new and mild Hair loss, new, not thyroid-related

## 2013-06-23 NOTE — Patient Instructions (Addendum)
Let's recheck the ultrasound.  you will receive a phone call, about a day and time for an appointment. blood tests are being requested for you today.  We'll contact you with results. Please return in 1 year.

## 2013-06-26 ENCOUNTER — Telehealth: Payer: Self-pay

## 2013-06-26 NOTE — Telephone Encounter (Signed)
Pt called back stating she is concerned on waiting 3 months to recheck labs. States that this is her first test to come back overactive.  Please advise, Thanks!

## 2013-06-26 NOTE — Telephone Encounter (Signed)
Ok for 1 month?

## 2013-06-26 NOTE — Telephone Encounter (Signed)
Pt informed

## 2013-06-30 ENCOUNTER — Ambulatory Visit
Admission: RE | Admit: 2013-06-30 | Discharge: 2013-06-30 | Disposition: A | Discharge status: Home / Self Care | Payer: Managed Care, Other (non HMO) | Source: Ambulatory Visit | Attending: Endocrinology | Admitting: Endocrinology

## 2013-06-30 DIAGNOSIS — E042 Nontoxic multinodular goiter: Secondary | ICD-10-CM

## 2013-09-28 ENCOUNTER — Other Ambulatory Visit: Payer: Self-pay | Admitting: Internal Medicine

## 2013-09-28 ENCOUNTER — Telehealth: Payer: Self-pay | Admitting: Internal Medicine

## 2013-09-28 NOTE — Telephone Encounter (Signed)
Patient needs a refill on her furosemide (LASIX) 40 MG tablet rx sent to [facility] on [address]. Please advise.

## 2013-09-29 MED ORDER — FUROSEMIDE 40 MG PO TABS: 40.0000 mg | ORAL_TABLET | Freq: Every day | ORAL | Status: DC

## 2013-10-01 ENCOUNTER — Other Ambulatory Visit (INDEPENDENT_AMBULATORY_CARE_PROVIDER_SITE_OTHER): Payer: Managed Care, Other (non HMO)

## 2013-10-01 ENCOUNTER — Other Ambulatory Visit: Payer: Self-pay

## 2013-10-01 DIAGNOSIS — E042 Nontoxic multinodular goiter: Secondary | ICD-10-CM

## 2013-10-01 LAB — TSH: TSH: 0.26 u[IU]/mL — ABNORMAL LOW (ref 0.35–4.50)

## 2013-10-01 LAB — T4, FREE: Free T4: 0.81 ng/dL (ref 0.60–1.60)

## 2014-01-14 ENCOUNTER — Other Ambulatory Visit: Payer: Self-pay | Admitting: Internal Medicine

## 2014-02-18 ENCOUNTER — Ambulatory Visit: Payer: Managed Care, Other (non HMO)

## 2014-04-17 ENCOUNTER — Other Ambulatory Visit: Payer: Self-pay | Admitting: Internal Medicine

## 2014-06-14 ENCOUNTER — Other Ambulatory Visit: Payer: Self-pay | Admitting: Internal Medicine

## 2014-06-17 ENCOUNTER — Telehealth: Payer: Self-pay | Admitting: Internal Medicine

## 2014-06-17 MED ORDER — LABETALOL HCL 200 MG PO TABS: 200.0000 mg | ORAL_TABLET | Freq: Two times a day (BID) | ORAL | Status: DC

## 2014-06-17 NOTE — Telephone Encounter (Signed)
Called pt no answer LMOM rx sent to pharmacy.../lmb 

## 2014-06-17 NOTE — Telephone Encounter (Signed)
Pt has an appt on 06/22/14, pt was wondering if she can get refill for labetalol (NORMODYNE) 200 MG tablet to be send to [facility] until she comes in for the appt. She is out of this med. Please call pt if this is ok

## 2014-06-22 ENCOUNTER — Ambulatory Visit: Payer: Managed Care, Other (non HMO) | Admitting: Internal Medicine

## 2014-06-25 ENCOUNTER — Other Ambulatory Visit (INDEPENDENT_AMBULATORY_CARE_PROVIDER_SITE_OTHER): Payer: Managed Care, Other (non HMO)

## 2014-06-25 ENCOUNTER — Ambulatory Visit (INDEPENDENT_AMBULATORY_CARE_PROVIDER_SITE_OTHER): Payer: Managed Care, Other (non HMO) | Admitting: Internal Medicine

## 2014-06-25 ENCOUNTER — Encounter: Payer: Self-pay | Admitting: Internal Medicine

## 2014-06-25 VITALS — BP 142/110 | HR 99 | Temp 99.1°F | Ht 61.0 in | Wt 196.1 lb

## 2014-06-25 DIAGNOSIS — Z0000 Encounter for general adult medical examination without abnormal findings: Secondary | ICD-10-CM

## 2014-06-25 DIAGNOSIS — Z0001 Encounter for general adult medical examination with abnormal findings: Secondary | ICD-10-CM | POA: Insufficient documentation

## 2014-06-25 LAB — URINALYSIS, ROUTINE W REFLEX MICROSCOPIC
Bilirubin Urine: NEGATIVE
KETONES UR: NEGATIVE
Leukocytes, UA: NEGATIVE
Nitrite: NEGATIVE
Specific Gravity, Urine: 1.01 (ref 1.000–1.030)
TOTAL PROTEIN, URINE-UPE24: NEGATIVE
Urine Glucose: NEGATIVE
Urobilinogen, UA: 0.2 (ref 0.0–1.0)
pH: 7 (ref 5.0–8.0)

## 2014-06-25 LAB — LIPID PANEL
Cholesterol: 182 mg/dL (ref 0–200)
HDL: 61.1 mg/dL (ref >39.00)
LDL CALC: 96 mg/dL (ref 0–99)
NonHDL: 120.9
Total CHOL/HDL Ratio: 3
Triglycerides: 126 mg/dL (ref 0.0–149.0)
VLDL: 25.2 mg/dL (ref 0.0–40.0)

## 2014-06-25 LAB — BASIC METABOLIC PANEL WITH GFR
BUN: 12 mg/dL (ref 6–23)
CO2: 25 meq/L (ref 19–32)
Calcium: 9.6 mg/dL (ref 8.4–10.5)
Chloride: 102 meq/L (ref 96–112)
Creatinine, Ser: 0.68 mg/dL (ref 0.40–1.20)
GFR: 123.73 mL/min (ref >60.00)
Glucose, Bld: 81 mg/dL (ref 70–99)
Potassium: 3.7 meq/L (ref 3.5–5.1)
Sodium: 135 meq/L (ref 135–145)

## 2014-06-25 LAB — T4, FREE: Free T4: 0.81 ng/dL (ref 0.60–1.60)

## 2014-06-25 LAB — CBC WITH DIFFERENTIAL/PLATELET
Basophils Absolute: 0 K/uL (ref 0.0–0.1)
Basophils Relative: 0.6 % (ref 0.0–3.0)
Eosinophils Absolute: 0.2 K/uL (ref 0.0–0.7)
Eosinophils Relative: 4.4 % (ref 0.0–5.0)
HCT: 37.9 % (ref 36.0–46.0)
Hemoglobin: 13.1 g/dL (ref 12.0–15.0)
Lymphocytes Relative: 33.3 % (ref 12.0–46.0)
Lymphs Abs: 1.7 K/uL (ref 0.7–4.0)
MCHC: 34.5 g/dL (ref 30.0–36.0)
MCV: 86.9 fl (ref 78.0–100.0)
Monocytes Absolute: 0.7 K/uL (ref 0.1–1.0)
Monocytes Relative: 12.9 % — ABNORMAL HIGH (ref 3.0–12.0)
Neutro Abs: 2.5 K/uL (ref 1.4–7.7)
Neutrophils Relative %: 48.8 % (ref 43.0–77.0)
Platelets: 237 K/uL (ref 150.0–400.0)
RBC: 4.36 Mil/uL (ref 3.87–5.11)
RDW: 12.1 % (ref 11.5–15.5)
WBC: 5.1 K/uL (ref 4.0–10.5)

## 2014-06-25 LAB — TSH: TSH: 1.32 u[IU]/mL (ref 0.35–4.50)

## 2014-06-25 LAB — HEPATIC FUNCTION PANEL
ALT: 19 U/L (ref 0–35)
AST: 16 U/L (ref 0–37)
Albumin: 4.4 g/dL (ref 3.5–5.2)
Alkaline Phosphatase: 39 U/L (ref 39–117)
Bilirubin, Direct: 0.1 mg/dL (ref 0.0–0.3)
Total Bilirubin: 0.3 mg/dL (ref 0.2–1.2)
Total Protein: 7.3 g/dL (ref 6.0–8.3)

## 2014-06-25 MED ORDER — PHENTERMINE HCL 37.5 MG PO CAPS: 37.5000 mg | ORAL_CAPSULE | ORAL | Status: DC

## 2014-06-25 MED ORDER — FUROSEMIDE 40 MG PO TABS: 40.0000 mg | ORAL_TABLET | Freq: Every day | ORAL | Status: DC

## 2014-06-25 MED ORDER — LABETALOL HCL 200 MG PO TABS: 200.0000 mg | ORAL_TABLET | Freq: Two times a day (BID) | ORAL | Status: DC

## 2014-06-25 MED ORDER — POTASSIUM CHLORIDE ER 10 MEQ PO TBCR: 10.0000 meq | EXTENDED_RELEASE_TABLET | Freq: Every day | ORAL | Status: DC

## 2014-06-25 NOTE — Patient Instructions (Signed)
Please take all new medication as prescribed - the phentermine  Please continue all other medications as before, and refills have been done if requested.  Please have the pharmacy call with any other refills you may need.  Please continue your efforts at being more active, low cholesterol diet, and weight control.  You are otherwise up to date with prevention measures today.  Please keep your appointments with your specialists as you may have planned  .[email or web address]  You will be contacted by phone if any changes need to be made immediately.  Otherwise, you will receive a letter about your results with an explanation, but please check with MyChart first.  Please remember to sign up for MyChart if you have not done so, as this will be important to you in the future with finding out test results, communicating by private email, and scheduling acute appointments online when needed.  Please return in 1 year for your yearly visit, or sooner if needed, with Lab testing done 3-5 days before

## 2014-06-25 NOTE — Progress Notes (Signed)
Pre visit review using our clinic review tool, if applicable. No additional management support is needed unless otherwise documented below in the visit note. 

## 2014-06-25 NOTE — Progress Notes (Signed)
Subjective:    Patient ID: Laura [name], female    DOB: September 25, 1975, 39 y.o.   MRN: [id number]  HPI   Here for wellness and f/u;  Overall doing ok;  Pt denies CP, worsening SOB, DOE, wheezing, orthopnea, PND, worsening LE edema, palpitations, dizziness or syncope.  Pt denies neurological change such as new headache, facial or extremity weakness.  Pt denies polydipsia, polyuria, or low sugar symptoms. Pt states overall good compliance with treatment and medications, good tolerability, and has been trying to follow lower cholesterol diet.  Pt denies worsening depressive symptoms, suicidal ideation or panic. No fever, night sweats, wt loss, loss of appetite, or other constitutional symptoms.  Pt states good ability with ADL's, has low fall risk, home safety reviewed and adequate, no other significant changes in hearing or vision, and only occasionally active with exercise.  Lost spome wt recent, but gained all back. Bp at home 130/80, unusually stressed today.  S/p root canal on Monday, on amoxil, has more nocturia than usual.  Cannot lose wt even with calorie counter and trying to be more active. No other complaints Past Medical History  Diagnosis Date  . ALLERGIC RHINITIS 06/01/2008  . Anxiety 08/15/2010  . ASTHMA 06/01/2008  . BURSITIS, LEFT HIP 04/28/2010  . GOITER, MULTINODULAR 06/28/2008  . Hirsutism 06/02/2009  . HYPERTENSION 06/01/2008  . Palpitations 07/25/2010  . PERIPHERAL EDEMA 04/28/2010  . TONSILLITIS, RECURRENT 05/05/2009  . Pharyngitis, acute 10/09/2010   Past Surgical History  Procedure Laterality Date  . Cesarean section  2007  . S/p sinus surgury  2006    reports that she has never smoked. She does not have any smokeless tobacco history on file. She reports that she does not drink alcohol or use illicit drugs. family history includes Hyperlipidemia in her maternal grandfather, maternal grandmother, and mother; Hypertension in her maternal grandfather, maternal grandmother, and  mother; Lung cancer in her maternal grandfather. No Known Allergies Current Outpatient Prescriptions on File Prior to Visit  Medication Sig Dispense Refill  . albuterol (PROVENTIL HFA;VENTOLIN HFA) 108 (90 BASE) MCG/ACT inhaler Inhale 2 puffs into the lungs every 4 (four) hours as needed. 3 Inhaler 3  . azithromycin (ZITHROMAX Z-PAK) 250 MG tablet Use as directed 6 each 0  . calcium carbonate (TUMS - DOSED IN MG ELEMENTAL CALCIUM) 500 MG chewable tablet Chew 2 tablets by mouth daily. As needed daily for heartburn/indigestion     . fluticasone (FLONASE) 50 MCG/ACT nasal spray Place 2 sprays into both nostrils daily. 48 g 0  . naproxen (NAPROSYN) 500 MG tablet Take 1 tablet (500 mg total) by mouth 2 (two) times daily with a meal. 60 tablet 2  . Prenatal Vit-Fe Fumarate-FA (PRENATAL PLUS/IRON) 27-1 MG TABS Take by mouth daily.      Marland Kitchen labetalol (NORMODYNE) 200 MG tablet Take 1 tablet (200 mg total) by mouth 2 (two) times daily. 180 tablet 3  . pantoprazole (PROTONIX) 40 MG tablet Take 1 tablet (40 mg total) by mouth daily. 90 tablet 3   No current facility-administered medications on file prior to visit.   Review of Systems Constitutional: Negative for increased diaphoresis, other activity, appetite or other siginficant weight change  HENT: Negative for worsening hearing loss, ear pain, facial swelling, mouth sores and neck stiffness.   Eyes: Negative for other worsening pain, redness or visual disturbance.  Respiratory: Negative for shortness of breath and wheezing.   Cardiovascular: Negative for chest pain and palpitations.  Gastrointestinal: Negative for diarrhea, blood in  stool, abdominal distention or other pain Genitourinary: Negative for hematuria, flank pain or change in urine volume.  Musculoskeletal: Negative for myalgias or other joint complaints.  Skin: Negative for color change and wound.  Neurological: Negative for syncope and numbness. other than noted Hematological: Negative for  adenopathy. or other swelling Psychiatric/Behavioral: Negative for hallucinations, self-injury, decreased concentration or other worsening agitation.      Objective:   Physical Exam BP 142/110 mmHg  Pulse 99  Temp(Src) 99.1 F (37.3 C) (Oral)  Ht 5\' 1"  (1.549 m)  Wt 196 lb 2 oz (88.962 kg)  BMI 37.08 kg/m2  SpO2 97% VS noted,  Constitutional: Pt is oriented to person, place, and time. Appears well-developed and well-nourished.  Head: Normocephalic and atraumatic.  Right Ear: External ear normal.  Left Ear: External ear normal.  Nose: Nose normal.  Mouth/Throat: Oropharynx is clear and moist.  Eyes: Conjunctivae and EOM are normal. Pupils are equal, round, and reactive to light.  Neck: Normal range of motion. Neck supple. No JVD present. No tracheal deviation present.  Cardiovascular: Normal rate, regular rhythm, normal heart sounds and intact distal pulses.   Pulmonary/Chest: Effort normal and breath sounds without rales or wheezing  Abdominal: Soft. Bowel sounds are normal. NT. No HSM  Musculoskeletal: Normal range of motion. Exhibits no edema.  Lymphadenopathy:  Has no cervical adenopathy.  Neurological: Pt is alert and oriented to person, place, and time. Pt has normal reflexes. No cranial nerve deficit. Motor grossly intact Skin: Skin is warm and dry. No rash noted.  Psychiatric:  Has normal mood and affect. Behavior is normal.     Assessment & Plan:

## 2014-06-26 NOTE — Assessment & Plan Note (Signed)
For phentermine limited rx asd,  to f/u any worsening symptoms or concerns

## 2014-06-26 NOTE — Assessment & Plan Note (Signed)

## 2014-06-28 ENCOUNTER — Telehealth: Payer: Self-pay | Admitting: *Deleted

## 2014-06-28 MED ORDER — AMLODIPINE BESYLATE 5 MG PO TABS: 5.0000 mg | ORAL_TABLET | Freq: Every day | ORAL | Status: DC

## 2014-06-28 NOTE — Telephone Encounter (Signed)
Left msg on triage stating saw md on Friday. MD rx phentermine & was toold to call back if BP still elevated. Pt states BP has been running in between 135-150/ 95-101, on yesterday BP was 145/101. She has not started phentermine yet wanting to know md recommendation on BP. Called pt back inform her md is out of the office today, but once he responds will call back...Johny Chess

## 2014-06-28 NOTE — Telephone Encounter (Signed)
Ok to hold on phentermine use  Ok for Amlodipine 5 qd  - and ROV 3 wks

## 2014-06-29 NOTE — Telephone Encounter (Signed)
Notified pt with md response. Schedule appt for2/26/16...lmb

## 2014-07-09 ENCOUNTER — Other Ambulatory Visit: Payer: Self-pay | Admitting: Internal Medicine

## 2014-07-19 ENCOUNTER — Other Ambulatory Visit: Payer: Self-pay | Admitting: Internal Medicine

## 2014-07-23 ENCOUNTER — Ambulatory Visit (INDEPENDENT_AMBULATORY_CARE_PROVIDER_SITE_OTHER): Payer: Managed Care, Other (non HMO) | Admitting: Internal Medicine

## 2014-07-23 VITALS — BP 140/90 | HR 79 | Temp 98.8°F | Resp 18 | Ht 60.5 in | Wt 194.1 lb

## 2014-07-23 DIAGNOSIS — I10 Essential (primary) hypertension: Secondary | ICD-10-CM

## 2014-07-23 MED ORDER — KETOROLAC TROMETHAMINE 30 MG/ML IJ SOLN: 30.0000 mg | Freq: Once | INTRAMUSCULAR | Status: DC

## 2014-07-23 MED ORDER — AMLODIPINE BESYLATE 10 MG PO TABS: 10.0000 mg | ORAL_TABLET | Freq: Every day | ORAL | Status: DC

## 2014-07-23 NOTE — Progress Notes (Signed)
Subjective:    Patient ID: Laura [name], female    DOB: 03/03/1976, 39 y.o.   MRN: [id number]  HPI  Here to f/u , amlod 5 mg started post last visit due to persistent elev BP, likely related to recent wt gain.  Stopped the phenetermine. Started the amlod 5 and tolerated well.  Had swelling in the past with nifedipine, has not been ACE tolerant in the past. Currently also on labetolol and lasix daily.  Ist still working on lower calories, icnreased activity and wt loss. Has gained overall 28 lbs in past yr Wt Readings from Last 3 Encounters:  07/23/14 194 lb 1.3 oz (88.034 kg)  06/25/14 196 lb 2 oz (88.962 kg)  06/23/13 194 lb (87.998 kg)  Good compliance will all meds. Past Medical History  Diagnosis Date  . ALLERGIC RHINITIS 06/01/2008  . Anxiety 08/15/2010  . ASTHMA 06/01/2008  . BURSITIS, LEFT HIP 04/28/2010  . GOITER, MULTINODULAR 06/28/2008  . Hirsutism 06/02/2009  . HYPERTENSION 06/01/2008  . Palpitations 07/25/2010  . PERIPHERAL EDEMA 04/28/2010  . TONSILLITIS, RECURRENT 05/05/2009  . Pharyngitis, acute 10/09/2010   Past Surgical History  Procedure Laterality Date  . Cesarean section  2007  . S/p sinus surgury  2006    reports that she has never smoked. She does not have any smokeless tobacco history on file. She reports that she does not drink alcohol or use illicit drugs. family history includes Hyperlipidemia in her maternal grandfather, maternal grandmother, and mother; Hypertension in her maternal grandfather, maternal grandmother, and mother; Lung cancer in her maternal grandfather. No Known Allergies Current Outpatient Prescriptions on File Prior to Visit  Medication Sig Dispense Refill  . albuterol (PROVENTIL HFA;VENTOLIN HFA) 108 (90 BASE) MCG/ACT inhaler Inhale 2 puffs into the lungs every 4 (four) hours as needed. 3 Inhaler 3  . calcium carbonate (TUMS - DOSED IN MG ELEMENTAL CALCIUM) 500 MG chewable tablet Chew 2 tablets by mouth daily. As needed daily for  heartburn/indigestion     . fluticasone (FLONASE) 50 MCG/ACT nasal spray USE 2 SPRAYS IN BOTH NOSTRILS DAILY 16 g 2  . furosemide (LASIX) 40 MG tablet Take 1 tablet (40 mg total) by mouth daily. 90 tablet 3  . labetalol (NORMODYNE) 200 MG tablet Take 1 tablet (200 mg total) by mouth 2 (two) times daily. 180 tablet 3  . labetalol (NORMODYNE) 200 MG tablet TAKE 1 TABLET BY MOUTH TWICE DAILY. 60 tablet 11  . naproxen (NAPROSYN) 500 MG tablet Take 1 tablet (500 mg total) by mouth 2 (two) times daily with a meal. 60 tablet 2  . potassium chloride (KLOR-CON 10) 10 MEQ tablet Take 1 tablet (10 mEq total) by mouth daily. 90 tablet 3  . Prenatal Vit-Fe Fumarate-FA (PRENATAL PLUS/IRON) 27-1 MG TABS Take by mouth daily.      Marland Kitchen labetalol (NORMODYNE) 200 MG tablet Take 1 tablet (200 mg total) by mouth 2 (two) times daily. 180 tablet 3  . pantoprazole (PROTONIX) 40 MG tablet Take 1 tablet (40 mg total) by mouth daily. 90 tablet 3   No current facility-administered medications on file prior to visit.     Review of Systems /All otherwise neg per pt     Objective:   Physical Exam BP 140/90 mmHg  Pulse 79  Temp(Src) 98.8 F (37.1 C) (Oral)  Resp 18  Ht 5' 0.5" (1.537 m)  Wt 194 lb 1.3 oz (88.034 kg)  BMI 37.27 kg/m2  SpO2 98%  LMP 07/02/2014 VS noted,  Constitutional: Pt appears well-developed, well-nourished.  HENT: Head: NCAT.  Right Ear: External ear normal.  Left Ear: External ear normal.  Eyes: . Pupils are equal, round, and reactive to light. Conjunctivae and EOM are normal Neck: Normal range of motion. Neck supple.  Cardiovascular: Normal rate and regular rhythm.   Pulmonary/Chest: Effort normal and breath sounds without rales or wheezing.  Neurological: Pt is alert. Not confused , motor grossly intact Skin: Skin is warm. No rash. Has chronic trace edema RLE today to ankle Psychiatric: Pt behavior is normal. No agitation.     Assessment & Plan:

## 2014-07-23 NOTE — Patient Instructions (Addendum)
The blood pressure is improved, but still needs further improvement.  OK to increase the amlodipine to 10 mg per day  Please call in 2-3 wks if it seems you have leg swelling that increases and does not easily go away with leg elevation  Please continue all other medications as before, and refills have been done if requested.  Please have the pharmacy call with any other refills you may need.  Please continue your efforts at being more active, low cholesterol diet, and weight control.  Please keep your appointments with your specialists as you may have planned

## 2014-07-23 NOTE — Assessment & Plan Note (Signed)
Worse recently, likely related to wt gain, to increase the amlod to 10 mg qd, but if worsening edema, might consider amlod 5 + ARB, in addition to other meds, cont to monitor BP at home and next visit,  to f/u any worsening symptoms or concerns

## 2014-07-23 NOTE — Progress Notes (Signed)
Pre visit review using our clinic review tool, if applicable. No additional management support is needed unless otherwise documented below in the visit note. 

## 2014-07-27 ENCOUNTER — Telehealth: Payer: Self-pay | Admitting: Internal Medicine

## 2014-07-27 NOTE — Telephone Encounter (Signed)
emmi emailed °

## 2014-10-13 ENCOUNTER — Ambulatory Visit (INDEPENDENT_AMBULATORY_CARE_PROVIDER_SITE_OTHER): Payer: 59 | Admitting: Internal Medicine

## 2014-10-13 ENCOUNTER — Other Ambulatory Visit (INDEPENDENT_AMBULATORY_CARE_PROVIDER_SITE_OTHER): Payer: 59

## 2014-10-13 ENCOUNTER — Encounter: Payer: Self-pay | Admitting: Internal Medicine

## 2014-10-13 VITALS — BP 150/92 | HR 102 | Temp 98.4°F | Wt 200.1 lb

## 2014-10-13 DIAGNOSIS — J4520 Mild intermittent asthma, uncomplicated: Secondary | ICD-10-CM

## 2014-10-13 DIAGNOSIS — R079 Chest pain, unspecified: Secondary | ICD-10-CM | POA: Diagnosis not present

## 2014-10-13 DIAGNOSIS — I10 Essential (primary) hypertension: Secondary | ICD-10-CM

## 2014-10-13 DIAGNOSIS — K219 Gastro-esophageal reflux disease without esophagitis: Secondary | ICD-10-CM | POA: Diagnosis not present

## 2014-10-13 LAB — TROPONIN I: TNIDX: 0 ug/l (ref 0.00–0.06)

## 2014-10-13 MED ORDER — LABETALOL HCL 300 MG PO TABS: 300.0000 mg | ORAL_TABLET | Freq: Two times a day (BID) | ORAL | Status: DC

## 2014-10-13 MED ORDER — PANTOPRAZOLE SODIUM 40 MG PO TBEC: 40.0000 mg | DELAYED_RELEASE_TABLET | Freq: Every day | ORAL | Status: DC

## 2014-10-13 MED ORDER — IRBESARTAN 150 MG PO TABS: 150.0000 mg | ORAL_TABLET | Freq: Every day | ORAL | Status: DC

## 2014-10-13 NOTE — Progress Notes (Signed)
Pre visit review using our clinic review tool, if applicable. No additional management support is needed unless otherwise documented below in the visit note. 

## 2014-10-13 NOTE — Progress Notes (Signed)
Subjective:    Patient ID: Laura [name], female    DOB: 03-10-1976, 39 y.o.   MRN: [id number]  HPI  Here to f/u; overall doing ok,  Pt denies increasing sob or doe, wheezing, orthopnea, PND, increased LE swelling, palpitations, dizziness or syncope.  Pt denies new neurological symptoms such as new headache, or facial or extremity weakness or numbness.  Pt denies polydipsia, polyuria, or low sugar episode.   Pt denies new neurological symptoms such as new headache, or facial or extremity weakness or numbness.   Pt states overall good compliance with meds, mostly trying to follow appropriate diet, with wt overall stable,  but little exercise however.  Has had several wks intermittent mild sharp left upper chest shooting pain, worse to move left shoulder, not assoc with diaphoresis, n/v, palp or dizziness. Wt Readings from Last 3 Encounters:  10/13/14 200 lb 1.3 oz (90.756 kg)  07/23/14 194 lb 1.3 oz (88.034 kg)  06/25/14 196 lb 2 oz (88.962 kg)  Has had worsening reflux, but no other abd pain, dysphagia, n/v, bowel change or blood. Has been out of PPI for several wks Past Medical History  Diagnosis Date  . ALLERGIC RHINITIS 06/01/2008  . Anxiety 08/15/2010  . ASTHMA 06/01/2008  . BURSITIS, LEFT HIP 04/28/2010  . GOITER, MULTINODULAR 06/28/2008  . Hirsutism 06/02/2009  . HYPERTENSION 06/01/2008  . Palpitations 07/25/2010  . PERIPHERAL EDEMA 04/28/2010  . TONSILLITIS, RECURRENT 05/05/2009  . Pharyngitis, acute 10/09/2010   Past Surgical History  Procedure Laterality Date  . Cesarean section  2007  . S/p sinus surgury  2006    reports that she has never smoked. She does not have any smokeless tobacco history on file. She reports that she does not drink alcohol or use illicit drugs. family history includes Hyperlipidemia in her maternal grandfather, maternal grandmother, and mother; Hypertension in her maternal grandfather, maternal grandmother, and mother; Lung cancer in her maternal  grandfather. No Known Allergies Current Outpatient Prescriptions on File Prior to Visit  Medication Sig Dispense Refill  . albuterol (PROVENTIL HFA;VENTOLIN HFA) 108 (90 BASE) MCG/ACT inhaler Inhale 2 puffs into the lungs every 4 (four) hours as needed. 3 Inhaler 3  . calcium carbonate (TUMS - DOSED IN MG ELEMENTAL CALCIUM) 500 MG chewable tablet Chew 2 tablets by mouth daily. As needed daily for heartburn/indigestion     . fluticasone (FLONASE) 50 MCG/ACT nasal spray USE 2 SPRAYS IN BOTH NOSTRILS DAILY 16 g 2  . furosemide (LASIX) 40 MG tablet Take 1 tablet (40 mg total) by mouth daily. 90 tablet 3  . labetalol (NORMODYNE) 200 MG tablet Take 1 tablet (200 mg total) by mouth 2 (two) times daily. 180 tablet 3  . potassium chloride (KLOR-CON 10) 10 MEQ tablet Take 1 tablet (10 mEq total) by mouth daily. 90 tablet 3  . labetalol (NORMODYNE) 200 MG tablet Take 1 tablet (200 mg total) by mouth 2 (two) times daily. 180 tablet 3  . naproxen (NAPROSYN) 500 MG tablet Take 1 tablet (500 mg total) by mouth 2 (two) times daily with a meal. (Patient not taking: Reported on 10/13/2014) 60 tablet 2  . Prenatal Vit-Fe Fumarate-FA (PRENATAL PLUS/IRON) 27-1 MG TABS Take by mouth daily.       No current facility-administered medications on file prior to visit.   Review of Systems  Constitutional: Negative for unusual diaphoresis or night sweats HENT: Negative for ringing in ear or discharge Eyes: Negative for double vision or worsening visual disturbance.  Respiratory:  Negative for choking and stridor.   Gastrointestinal: Negative for vomiting or other signifcant bowel change Genitourinary: Negative for hematuria or change in urine volume.  Musculoskeletal: Negative for other MSK pain or swelling Skin: Negative for color change and worsening wound.  Neurological: Negative for tremors and numbness other than noted  Psychiatric/Behavioral: Negative for decreased concentration or agitation other than above        Objective:   Physical Exam BP 150/92 mmHg  Pulse 102  Temp(Src) 98.4 F (36.9 C) (Oral)  Wt 200 lb 1.3 oz (90.756 kg)  SpO2 98% BP Readings from Last 3 Encounters:  10/13/14 150/92  07/23/14 140/90  06/25/14 142/110  VS noted,  Constitutional: Pt appears in no significant distress HENT: Head: NCAT.  Right Ear: External ear normal.  Left Ear: External ear normal.  Eyes: . Pupils are equal, round, and reactive to light. Conjunctivae and EOM are normal Neck: Normal range of motion. Neck supple.  Cardiovascular: Normal rate and regular rhythm.   Pulmonary/Chest: Effort normal and breath sounds without rales or wheezing.  Abd:  Soft, NT, ND, + BS Neurological: Pt is alert. Not confused , motor grossly intact Skin: Skin is warm. No rash, no LE edema Psychiatric: Pt behavior is normal. No agitation.  Tender left upper chest wall near shoulder noted, repoducing pain       Assessment & Plan:

## 2014-10-13 NOTE — Patient Instructions (Addendum)
OK to stop the amlodipine  OK to increase the labetolol to 300 mg twice per day  OK to start the generic Avapro 150 mg per day  Please call in 1-2 wks if not improved, as we could increase the avapro to 300 mg  Please continue all other medications as before, and refills have been done if requested, including restarting the protonix  Please have the pharmacy call with any other refills you may need.  Please continue your efforts at being more active, low cholesterol diet, and weight control.  Please keep your appointments with your specialists as you may have planned  Please go to the LAB in the Basement (turn left off the elevator) for the tests to be done today  You will be contacted by phone if any changes need to be made immediately.  Otherwise, you will receive a letter about your results with an explanation, but please check with MyChart first.  Please remember to sign up for MyChart if you have not done so, as this will be important to you in the future with finding out test results, communicating by private email, and scheduling acute appointments online when needed.

## 2014-10-14 LAB — CK TOTAL AND CKMB (NOT AT ARMC)
CK, MB: <0.7 ng/mL (ref 0.0–5.0)
Total CK: 176 U/L (ref 7–177)

## 2014-10-17 NOTE — Assessment & Plan Note (Signed)
Atypical, most likely msk, ok to follow without specific eval , but to f/u any persistent or worsening to consider further eval

## 2014-10-17 NOTE — Assessment & Plan Note (Signed)
Ok to re-start PPI,  to f/u any worsening symptoms or concerns

## 2014-10-17 NOTE — Assessment & Plan Note (Addendum)
Mild uncontrolled, but stable overall by history and exam, recent data reviewed with pt, and pt to increase the labetolol , d/c norvasc as did not seem to lower BP at all, add avapro 150 qd,  to f/u any worsening symptoms or concerns BP Readings from Last 3 Encounters:  10/13/14 150/92  07/23/14 140/90  06/25/14 142/110

## 2014-10-17 NOTE — Assessment & Plan Note (Signed)
stable overall by history and exam, recent data reviewed with pt, and pt to continue medical treatment as before,  to f/u any worsening symptoms or concerns SpO2 Readings from Last 3 Encounters:  10/13/14 98%  07/23/14 98%  06/25/14 97%

## 2015-06-17 ENCOUNTER — Other Ambulatory Visit: Payer: Self-pay | Admitting: Internal Medicine

## 2015-07-22 ENCOUNTER — Other Ambulatory Visit: Payer: Self-pay | Admitting: Internal Medicine

## 2015-08-24 IMAGING — US US SOFT TISSUE HEAD/NECK
1 series · 13 of 25 positions shown · non-contrast
Comparison: 08/01/2010 and earlier studies

CLINICAL DATA: Thyroid nodule

EXAM:
THYROID ULTRASOUND
TECHNIQUE: Ultrasound examination of the thyroid gland and adjacent soft
tissues was performed.

[Series 1: us soft tissue head/neck · 0.08mm/px · 13 of 46 slices shown]
[im 1/46]
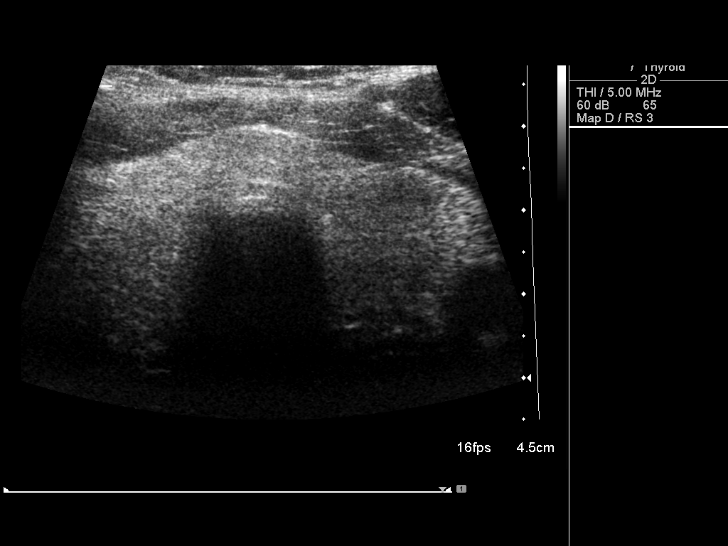
[im 4/46]
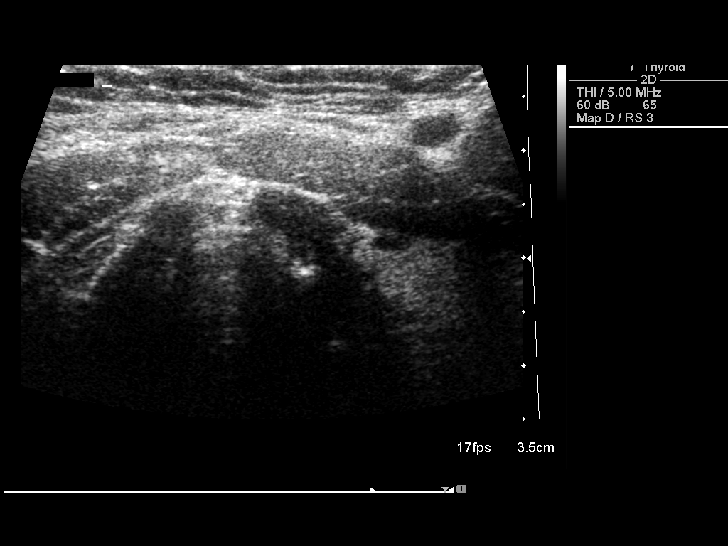
[im 8/46]
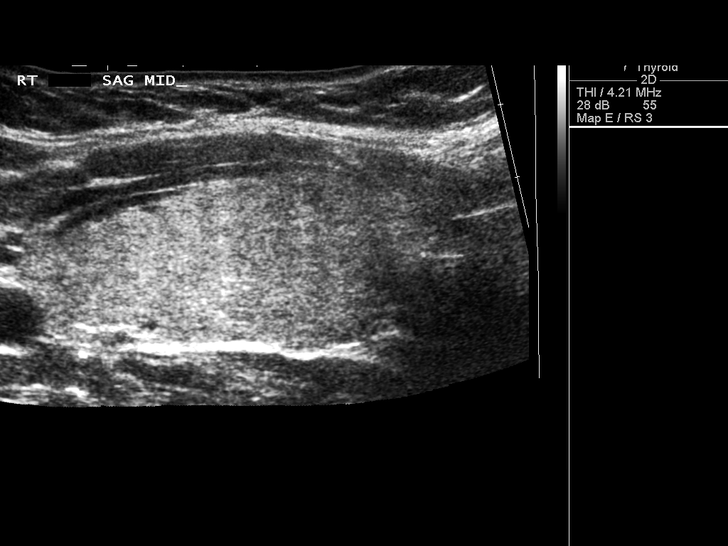
[im 12/46]
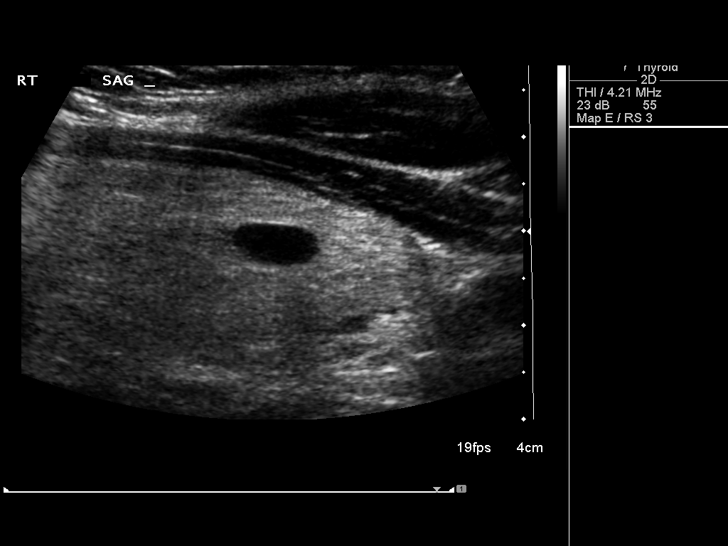
[im 16/46]
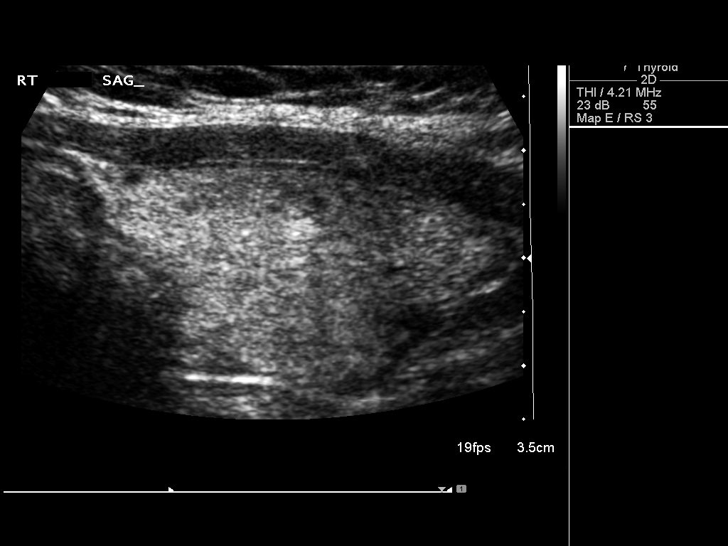
[im 19/46]
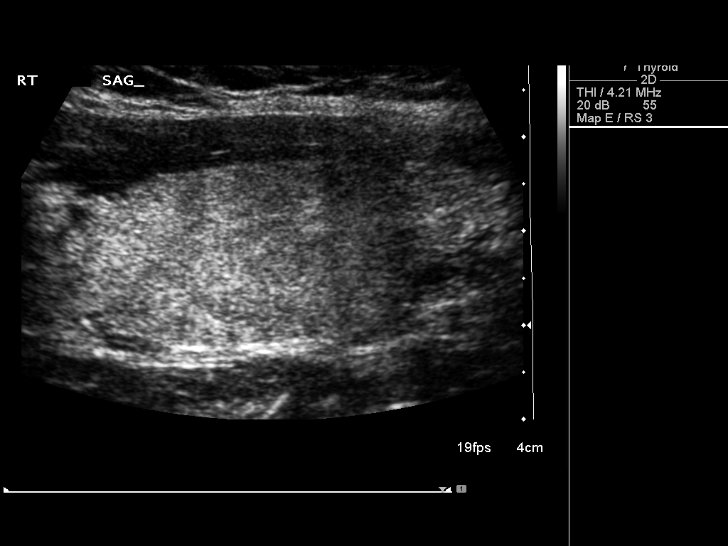
[im 23/46]
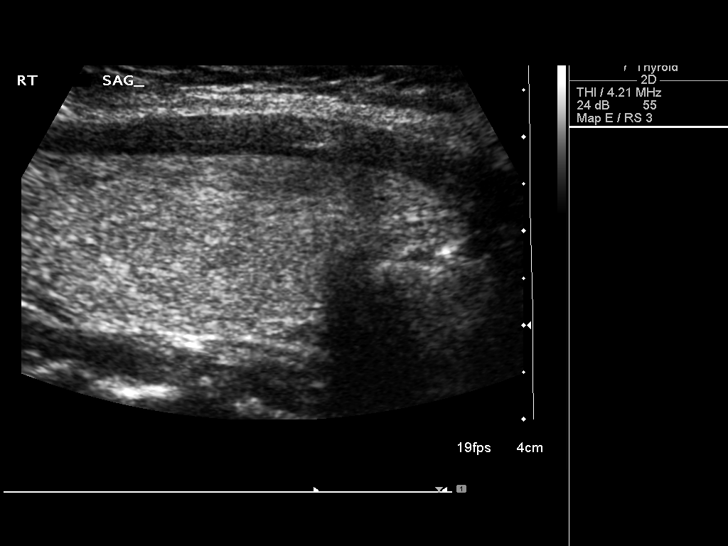
[im 27/46]
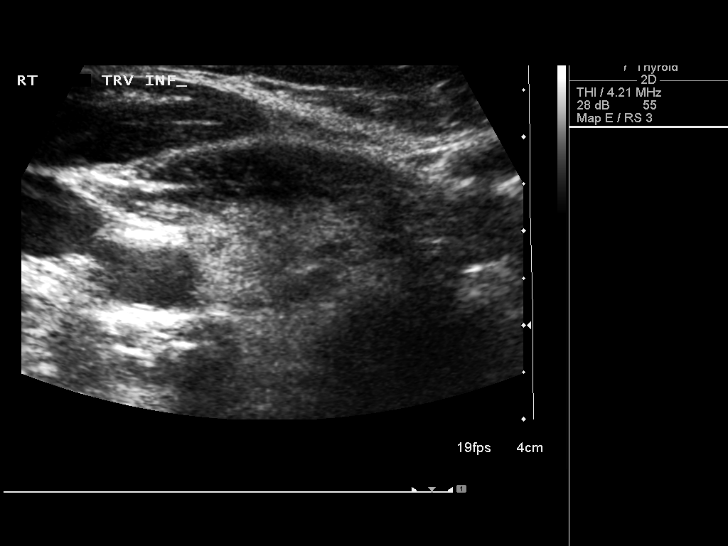
[im 31/46]
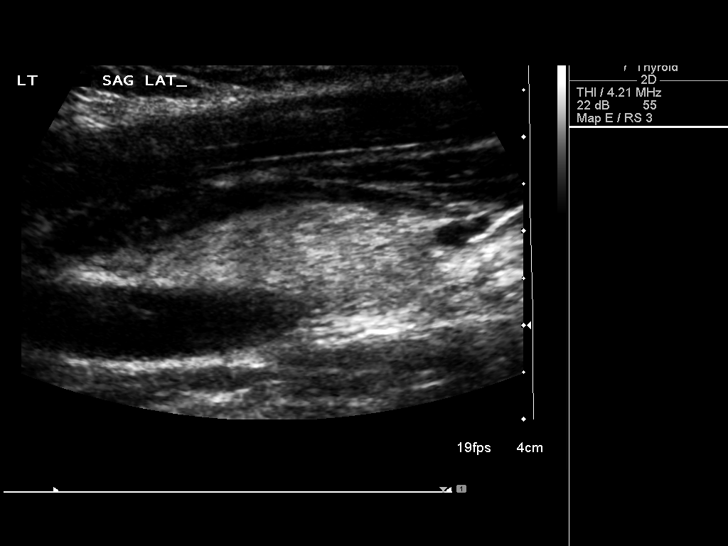
[im 34/46]
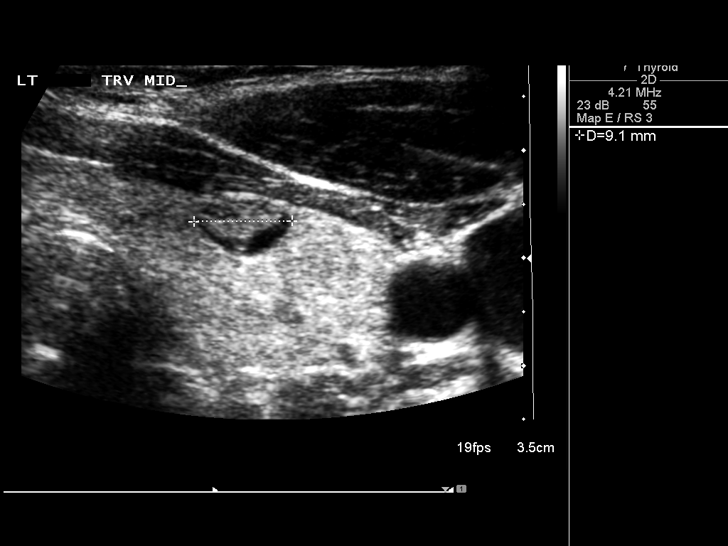
[im 38/46]
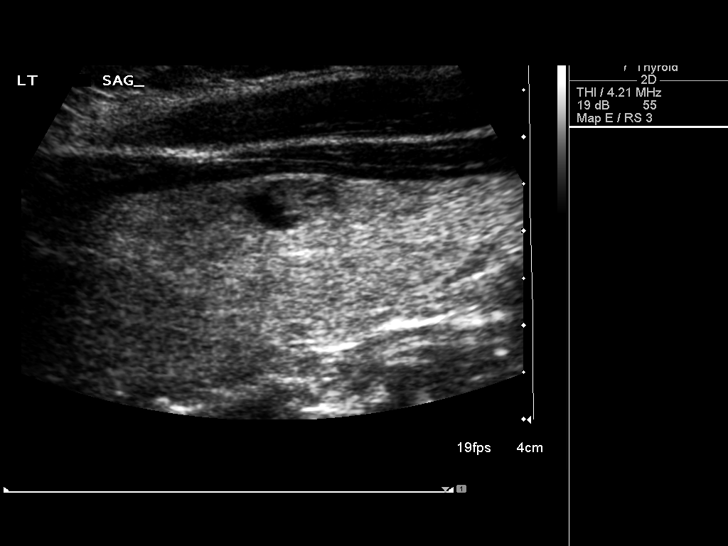
[im 42/46]
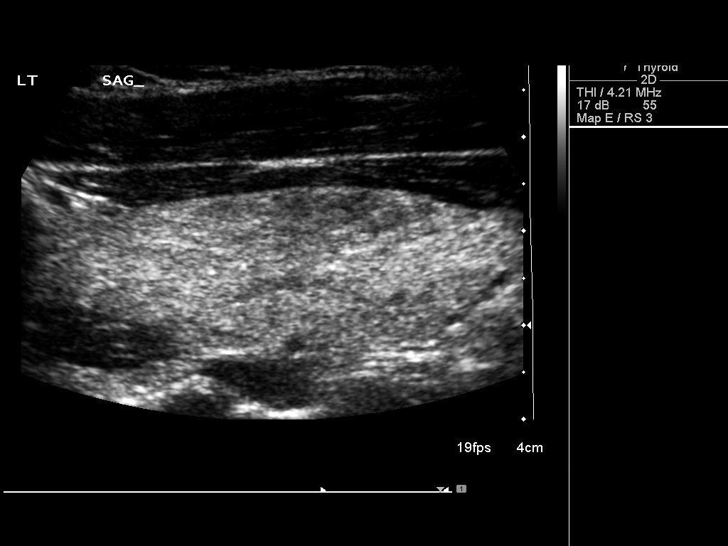
[im 46/46]
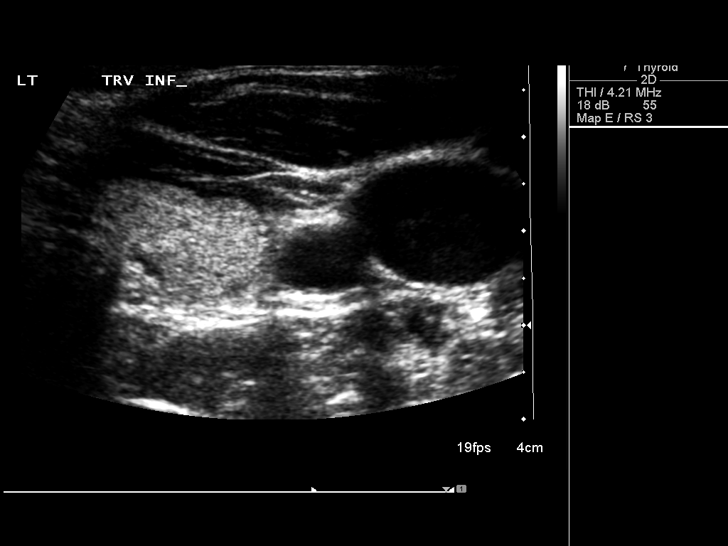

[13 of 25 positions shown; findings below may reference images not displayed]

FINDINGS: Right thyroid lobe

Measurements: 59 x 25 x 26 mm. There is a 10 x 5 x 6 mm cyst in the
inferior pole (previously 6 x 4 x 5). At least 3 small cystic/
hypoechoic nodules measuring 4 mm or less.

Left thyroid lobe

Measurements: 59 x 19 x 23 mm. 11 x 5 x 9 mm complex mostly solid
nodule, mid lobe. Smaller 8 x 4 x 6 mm hypoechoic nodule, inferior
pole (previously 7 x 4 x 7).

Isthmus

Thickness: 8 mm.  No nodules visualized.

Lymphadenopathy

None visualized.
IMPRESSION: Slight enlargement of small bilateral lesions. Findings do not meet
current consensus criteria for biopsy. Follow-up by clinical exam is
recommended. If patient has known risk factors for thyroid
carcinoma, consider follow-up ultrasound in 12 months. If patient is
clinically hyperthyroid, consider nuclear medicine thyroid uptake
and scan. This recommendation follows the consensus statement:
Management of Thyroid Nodules Detected as US: Society of
Radiologists in Ultrasound Consensus Conference Statement. Radiology

## 2015-09-09 ENCOUNTER — Other Ambulatory Visit: Payer: Self-pay | Admitting: Internal Medicine

## 2015-10-06 ENCOUNTER — Other Ambulatory Visit: Payer: Self-pay | Admitting: Internal Medicine

## 2015-11-30 ENCOUNTER — Other Ambulatory Visit: Payer: Self-pay | Admitting: Internal Medicine

## 2015-12-23 ENCOUNTER — Other Ambulatory Visit: Payer: Self-pay | Admitting: Internal Medicine

## 2016-01-09 ENCOUNTER — Telehealth: Payer: Self-pay | Admitting: Emergency Medicine

## 2016-01-09 NOTE — Telephone Encounter (Signed)
Pt called and needs a prescription refills for irbesartan (AVAPRO) 150 MG tablet, labetalol (NORMODYNE) 300 MG tablet and potassium chloride (KLOR-CON 10) 10 MEQ tablet. Pharmacy is [facility]. Please follow up thanks.

## 2016-01-10 MED ORDER — POTASSIUM CHLORIDE ER 10 MEQ PO TBCR: 10.0000 meq | EXTENDED_RELEASE_TABLET | Freq: Every day | ORAL | 3 refills | Status: DC

## 2016-01-10 MED ORDER — LABETALOL HCL 200 MG PO TABS: 200.0000 mg | ORAL_TABLET | Freq: Two times a day (BID) | ORAL | 3 refills | Status: DC

## 2016-01-10 MED ORDER — IRBESARTAN 150 MG PO TABS: ORAL_TABLET | ORAL | 0 refills | Status: DC

## 2016-01-10 NOTE — Telephone Encounter (Signed)
Medication refills sent to pharmacy 

## 2016-04-16 ENCOUNTER — Other Ambulatory Visit: Payer: Self-pay | Admitting: Internal Medicine

## 2016-04-28 ENCOUNTER — Other Ambulatory Visit: Payer: Self-pay | Admitting: Internal Medicine

## 2016-05-17 ENCOUNTER — Ambulatory Visit (INDEPENDENT_AMBULATORY_CARE_PROVIDER_SITE_OTHER): Payer: 59 | Admitting: Internal Medicine

## 2016-05-17 ENCOUNTER — Encounter: Payer: Self-pay | Admitting: Internal Medicine

## 2016-05-17 ENCOUNTER — Other Ambulatory Visit (INDEPENDENT_AMBULATORY_CARE_PROVIDER_SITE_OTHER): Payer: 59

## 2016-05-17 VITALS — BP 130/78 | HR 100 | Resp 20 | Wt 212.0 lb

## 2016-05-17 DIAGNOSIS — R5383 Other fatigue: Secondary | ICD-10-CM

## 2016-05-17 DIAGNOSIS — D509 Iron deficiency anemia, unspecified: Secondary | ICD-10-CM

## 2016-05-17 DIAGNOSIS — N920 Excessive and frequent menstruation with regular cycle: Secondary | ICD-10-CM

## 2016-05-17 DIAGNOSIS — Z0000 Encounter for general adult medical examination without abnormal findings: Secondary | ICD-10-CM

## 2016-05-17 LAB — BASIC METABOLIC PANEL
BUN: 10 mg/dL (ref 6–23)
CO2: 29 meq/L (ref 19–32)
Calcium: 9.3 mg/dL (ref 8.4–10.5)
Chloride: 101 mEq/L (ref 96–112)
Creatinine, Ser: 0.71 mg/dL (ref 0.40–1.20)
GFR: 116.6 mL/min (ref >60.00)
GLUCOSE: 88 mg/dL (ref 70–99)
Potassium: 4 mEq/L (ref 3.5–5.1)
Sodium: 136 mEq/L (ref 135–145)

## 2016-05-17 LAB — URINALYSIS, ROUTINE W REFLEX MICROSCOPIC
Bilirubin Urine: NEGATIVE
KETONES UR: NEGATIVE
LEUKOCYTES UA: NEGATIVE
Nitrite: NEGATIVE
PH: 6 (ref 5.0–8.0)
SPECIFIC GRAVITY, URINE: 1.01 (ref 1.000–1.030)
TOTAL PROTEIN, URINE-UPE24: NEGATIVE
Urine Glucose: NEGATIVE
Urobilinogen, UA: 0.2 (ref 0.0–1.0)

## 2016-05-17 LAB — HEPATIC FUNCTION PANEL
ALBUMIN: 4.5 g/dL (ref 3.5–5.2)
ALT: 27 U/L (ref 0–35)
AST: 21 U/L (ref 0–37)
Alkaline Phosphatase: 49 U/L (ref 39–117)
Bilirubin, Direct: 0.1 mg/dL (ref 0.0–0.3)
TOTAL PROTEIN: 7.4 g/dL (ref 6.0–8.3)
Total Bilirubin: 0.5 mg/dL (ref 0.2–1.2)

## 2016-05-17 LAB — CBC WITH DIFFERENTIAL/PLATELET
BASOS ABS: 0 10*3/uL (ref 0.0–0.1)
Basophils Relative: 0.4 % (ref 0.0–3.0)
EOS ABS: 0.2 10*3/uL (ref 0.0–0.7)
Eosinophils Relative: 3 % (ref 0.0–5.0)
HCT: 39.6 % (ref 36.0–46.0)
HEMOGLOBIN: 13.6 g/dL (ref 12.0–15.0)
LYMPHS ABS: 1.8 10*3/uL (ref 0.7–4.0)
Lymphocytes Relative: 31.6 % (ref 12.0–46.0)
MCHC: 34.3 g/dL (ref 30.0–36.0)
MCV: 87.8 fl (ref 78.0–100.0)
MONO ABS: 0.6 10*3/uL (ref 0.1–1.0)
Monocytes Relative: 11.5 % (ref 3.0–12.0)
NEUTROS PCT: 53.5 % (ref 43.0–77.0)
Neutro Abs: 3 10*3/uL (ref 1.4–7.7)
Platelets: 246 10*3/uL (ref 150.0–400.0)
RBC: 4.51 Mil/uL (ref 3.87–5.11)
RDW: 15.5 % (ref 11.5–15.5)
WBC: 5.6 10*3/uL (ref 4.0–10.5)

## 2016-05-17 LAB — IBC PANEL
Iron: 153 ug/dL — ABNORMAL HIGH (ref 42–145)
SATURATION RATIOS: 45.5 % (ref 20.0–50.0)
Transferrin: 240 mg/dL (ref 212.0–360.0)

## 2016-05-17 LAB — VITAMIN B12: Vitamin B-12: 751 pg/mL (ref 211–911)

## 2016-05-17 LAB — LIPID PANEL
CHOLESTEROL: 175 mg/dL (ref 0–200)
HDL: 51.6 mg/dL (ref >39.00)
LDL CALC: 109 mg/dL — AB (ref 0–99)
NonHDL: 122.93
TRIGLYCERIDES: 71 mg/dL (ref 0.0–149.0)
Total CHOL/HDL Ratio: 3
VLDL: 14.2 mg/dL (ref 0.0–40.0)

## 2016-05-17 LAB — TSH: TSH: 0.64 u[IU]/mL (ref 0.35–4.50)

## 2016-05-17 MED ORDER — LABETALOL HCL 300 MG PO TABS: ORAL_TABLET | ORAL | 3 refills | Status: DC

## 2016-05-17 MED ORDER — IRBESARTAN 150 MG PO TABS
ORAL_TABLET | ORAL | 3 refills | Status: DC
Start: 2016-05-17 — End: 2016-10-17

## 2016-05-17 MED ORDER — PANTOPRAZOLE SODIUM 40 MG PO TBEC: DELAYED_RELEASE_TABLET | ORAL | 3 refills | Status: DC

## 2016-05-17 NOTE — Progress Notes (Signed)
Pre visit review using our clinic review tool, if applicable. No additional management support is needed unless otherwise documented below in the visit note. 

## 2016-05-17 NOTE — Progress Notes (Signed)
Subjective:    Patient ID: Laura [name], female    DOB: 1975/12/26, 40 y.o.   MRN: [id number]  HPI  Here for wellness and f/u;  Overall doing ok;  Pt denies Chest pain, worsening SOB, DOE, wheezing, orthopnea, PND, worsening LE edema, palpitations, dizziness or syncope.  Pt denies neurological change such as new headache, facial or extremity weakness.  Pt denies polydipsia, polyuria, or low sugar symptoms. Pt states overall good compliance with treatment and medications, good tolerability, and has been trying to follow appropriate diet.  Pt denies worsening depressive symptoms, suicidal ideation or panic. No fever, night sweats, wt loss, loss of appetite, or other constitutional symptoms.  Pt states good ability with ADL's, has low fall risk, home safety reviewed and adequate, no other significant changes in hearing or vision, and only occasionally active with exercise. Does c/o ongoing fatigue, also snores, but denies signficant daytime hypersomnolence, overall just fatigued in the last 6 wks, and not every day, just some days worse than others. Has been working hard recently, getting less sleep lately.    No fever.  Does have regular heavy menses  Also hx of anemia, asks for b12 check, and does recall feb 2017 hgb about 8.  No other new history findings Past Medical History:  Diagnosis Date  . ALLERGIC RHINITIS 06/01/2008  . Anxiety 08/15/2010  . ASTHMA 06/01/2008  . BURSITIS, LEFT HIP 04/28/2010  . GOITER, MULTINODULAR 06/28/2008  . Hirsutism 06/02/2009  . HYPERTENSION 06/01/2008  . Palpitations 07/25/2010  . PERIPHERAL EDEMA 04/28/2010  . Pharyngitis, acute 10/09/2010  . TONSILLITIS, RECURRENT 05/05/2009   Past Surgical History:  Procedure Laterality Date  . CESAREAN SECTION  2007  . s/p sinus surgury  2006    reports that she has never smoked. She does not have any smokeless tobacco history on file. She reports that she does not drink alcohol or use drugs. family history includes  Hyperlipidemia in her maternal grandfather, maternal grandmother, and mother; Hypertension in her maternal grandfather, maternal grandmother, and mother; Lung cancer in her maternal grandfather. No Known Allergies Current Outpatient Prescriptions on File Prior to Visit  Medication Sig Dispense Refill  . albuterol (PROVENTIL HFA;VENTOLIN HFA) 108 (90 BASE) MCG/ACT inhaler Inhale 2 puffs into the lungs every 4 (four) hours as needed. 3 Inhaler 3  . calcium carbonate (TUMS - DOSED IN MG ELEMENTAL CALCIUM) 500 MG chewable tablet Chew 2 tablets by mouth daily. As needed daily for heartburn/indigestion     . fluticasone (FLONASE) 50 MCG/ACT nasal spray USE 2 SPRAYS IN BOTH NOSTRILS DAILY 16 g 2  . furosemide (LASIX) 40 MG tablet Take 1 tablet (40 mg total) by mouth daily. 90 tablet 3  . naproxen (NAPROSYN) 500 MG tablet Take 1 tablet (500 mg total) by mouth 2 (two) times daily with a meal. 60 tablet 2  . potassium chloride (KLOR-CON 10) 10 MEQ tablet Take 1 tablet (10 mEq total) by mouth daily. 90 tablet 3  . Prenatal Vit-Fe Fumarate-FA (PRENATAL PLUS/IRON) 27-1 MG TABS Take by mouth daily.       No current facility-administered medications on file prior to visit.    Review of Systems Constitutional: Negative for increased diaphoresis, or other activity, appetite or siginficant weight change other than noted HENT: Negative for worsening hearing loss, ear pain, facial swelling, mouth sores and neck stiffness.   Eyes: Negative for other worsening pain, redness or visual disturbance.  Respiratory: Negative for choking or stridor Cardiovascular: Negative for other chest pain  and palpitations.  Gastrointestinal: Negative for worsening diarrhea, blood in stool, or abdominal distention Genitourinary: Negative for hematuria, flank pain or change in urine volume.  Musculoskeletal: Negative for myalgias or other joint complaints.  Skin: Negative for other color change and wound or drainage.  Neurological:  Negative for syncope and numbness. other than noted Hematological: Negative for adenopathy. or other swelling Psychiatric/Behavioral: Negative for hallucinations, SI, self-injury, decreased concentration or other worsening agitation.  All other system neg per pt    Objective:   Physical Exam BP 130/78   Pulse 100   Resp 20   Wt 212 lb (96.2 kg)   SpO2 96%   BMI 40.72 kg/m  VS noted,  Constitutional: Pt is oriented to person, place, and time. Appears well-developed and well-nourished, in no significant distress Head: Normocephalic and atraumatic  Eyes: Conjunctivae and EOM are normal. Pupils are equal, round, and reactive to light Right Ear: External ear normal.  Left Ear: External ear normal Nose: Nose normal.  Mouth/Throat: Oropharynx is clear and moist  Neck: Normal range of motion. Neck supple. No JVD present. No tracheal deviation present or significant neck LA or mass Cardiovascular: Normal rate, regular rhythm, normal heart sounds and intact distal pulses.   Pulmonary/Chest: Effort normal and breath sounds without rales or wheezing  Abdominal: Soft. Bowel sounds are normal. NT. No HSM  Musculoskeletal: Normal range of motion. Exhibits no edema Lymphadenopathy: Has no cervical adenopathy.  Neurological: Pt is alert and oriented to person, place, and time. Pt has normal reflexes. No cranial nerve deficit. Motor grossly intact Skin: Skin is warm and dry. No rash noted or new ulcers Psychiatric:  Has normal mood and affect. Behavior is normal.  No other new exam findings    Assessment & Plan:

## 2016-05-17 NOTE — Patient Instructions (Signed)

## 2016-05-18 ENCOUNTER — Encounter: Payer: Self-pay | Admitting: Internal Medicine

## 2016-05-19 NOTE — Assessment & Plan Note (Signed)
For iron lab, cbc,  to f/u any worsening symptoms or concerns

## 2016-05-19 NOTE — Assessment & Plan Note (Signed)
Now on iron daily since feb 2017, to f/u cbcm also f/u GYN

## 2016-05-19 NOTE — Assessment & Plan Note (Signed)
?   Symptomatic anemia vs overwork vs sleep apnea, for labs today and GYN f/u, but declines OSA eval

## 2016-05-19 NOTE — Assessment & Plan Note (Signed)

## 2016-10-17 ENCOUNTER — Other Ambulatory Visit: Payer: Self-pay | Admitting: *Deleted

## 2016-10-17 MED ORDER — LABETALOL HCL 300 MG PO TABS: ORAL_TABLET | ORAL | 1 refills | Status: DC

## 2016-10-17 MED ORDER — IRBESARTAN 150 MG PO TABS: ORAL_TABLET | ORAL | 1 refills | Status: DC

## 2016-12-28 ENCOUNTER — Encounter: Payer: Self-pay | Admitting: Internal Medicine

## 2016-12-28 ENCOUNTER — Ambulatory Visit (INDEPENDENT_AMBULATORY_CARE_PROVIDER_SITE_OTHER): Payer: 59 | Admitting: Internal Medicine

## 2016-12-28 VITALS — BP 124/84 | HR 112 | Ht 60.0 in | Wt 223.0 lb

## 2016-12-28 DIAGNOSIS — Z0000 Encounter for general adult medical examination without abnormal findings: Secondary | ICD-10-CM | POA: Diagnosis not present

## 2016-12-28 DIAGNOSIS — J069 Acute upper respiratory infection, unspecified: Secondary | ICD-10-CM

## 2016-12-28 DIAGNOSIS — J45909 Unspecified asthma, uncomplicated: Secondary | ICD-10-CM | POA: Diagnosis not present

## 2016-12-28 DIAGNOSIS — I10 Essential (primary) hypertension: Secondary | ICD-10-CM

## 2016-12-28 MED ORDER — LEVOFLOXACIN 500 MG PO TABS: 500.0000 mg | ORAL_TABLET | Freq: Every day | ORAL | 0 refills | Status: AC

## 2016-12-28 MED ORDER — IBUPROFEN 800 MG PO TABS: 800.0000 mg | ORAL_TABLET | Freq: Three times a day (TID) | ORAL | 0 refills | Status: DC | PRN

## 2016-12-28 NOTE — Progress Notes (Signed)
Subjective:    Patient ID: Laura [name], female    DOB: 04-17-1976, 41 y.o.   MRN: [id number]  HPI   Here with 2-3 days acute onset fever, facial pain, pressure, headache, general weakness and malaise, and greenish d/c, with mild ST and cough, but pt denies chest pain, wheezing, increased sob or doe, orthopnea, PND, increased LE swelling, palpitations, dizziness or syncope. Pt denies new neurological symptoms such as new headache, or facial or extremity weakness or numbness   Pt denies polydipsia, polyuria Past Medical History:  Diagnosis Date  . ALLERGIC RHINITIS 06/01/2008  . Anxiety 08/15/2010  . ASTHMA 06/01/2008  . BURSITIS, LEFT HIP 04/28/2010  . GOITER, MULTINODULAR 06/28/2008  . Hirsutism 06/02/2009  . HYPERTENSION 06/01/2008  . Palpitations 07/25/2010  . PERIPHERAL EDEMA 04/28/2010  . Pharyngitis, acute 10/09/2010  . TONSILLITIS, RECURRENT 05/05/2009   Past Surgical History:  Procedure Laterality Date  . CESAREAN SECTION  2007  . s/p sinus surgury  2006    reports that she has never smoked. She has never used smokeless tobacco. She reports that she does not drink alcohol or use drugs. family history includes Hyperlipidemia in her maternal grandfather, maternal grandmother, and mother; Hypertension in her maternal grandfather, maternal grandmother, and mother; Lung cancer in her maternal grandfather. No Known Allergies Current Outpatient Prescriptions on File Prior to Visit  Medication Sig Dispense Refill  . albuterol (PROVENTIL HFA;VENTOLIN HFA) 108 (90 BASE) MCG/ACT inhaler Inhale 2 puffs into the lungs every 4 (four) hours as needed. 3 Inhaler 3  . calcium carbonate (TUMS - DOSED IN MG ELEMENTAL CALCIUM) 500 MG chewable tablet Chew 2 tablets by mouth daily. As needed daily for heartburn/indigestion     . fluticasone (FLONASE) 50 MCG/ACT nasal spray USE 2 SPRAYS IN BOTH NOSTRILS DAILY 16 g 2  . furosemide (LASIX) 40 MG tablet Take 1 tablet (40 mg total) by mouth daily. 90 tablet  3  . irbesartan (AVAPRO) 150 MG tablet TAKE 1 TABLET(150 MG) BY MOUTH DAILY 90 tablet 1  . labetalol (NORMODYNE) 300 MG tablet TAKE 1 TABLET(300 MG) BY MOUTH TWICE DAILY 180 tablet 1  . naproxen (NAPROSYN) 500 MG tablet Take 1 tablet (500 mg total) by mouth 2 (two) times daily with a meal. 60 tablet 2  . pantoprazole (PROTONIX) 40 MG tablet TAKE 1 TABLET(40 MG) BY MOUTH DAILY 90 tablet 3  . potassium chloride (KLOR-CON 10) 10 MEQ tablet Take 1 tablet (10 mEq total) by mouth daily. 90 tablet 3  . Prenatal Vit-Fe Fumarate-FA (PRENATAL PLUS/IRON) 27-1 MG TABS Take by mouth daily.       No current facility-administered medications on file prior to visit.    Review of Systems  Constitutional: Negative for other unusual diaphoresis or sweats HENT: Negative for ear discharge or swelling Eyes: Negative for other worsening visual disturbances Respiratory: Negative for stridor or other swelling  Gastrointestinal: Negative for worsening distension or other blood Genitourinary: Negative for retention or other urinary change Musculoskeletal: Negative for other MSK pain or swelling Skin: Negative for color change or other new lesions Neurological: Negative for worsening tremors and other numbness  Psychiatric/Behavioral: Negative for worsening agitation or other fatigue All other system neg per pt    Objective:   Physical Exam BP 124/84   Pulse (!) 112   Ht 5' (1.524 m)   Wt 223 lb (101.2 kg)   SpO2 99%   BMI 43.55 kg/m  VS noted, mild ill Constitutional: Pt appears in NAD HENT: Head:  NCAT.  Right Ear: External ear normal.  Left Ear: External ear normal.  Eyes: . Pupils are equal, round, and reactive to light. Conjunctivae and EOM are normal Nose: without d/c or deformity Bilat tm's with mild erythema.  Max sinus areas mild tender.  Pharynx with mild erythema, no exudate Neck: Neck supple. Gross normal ROM Cardiovascular: Normal rate and regular rhythm.   Pulmonary/Chest: Effort normal  and breath sounds without rales or wheezing.  Neurological: Pt is alert. At baseline orientation, motor grossly intact Skin: Skin is warm. No rashes, other new lesions, no LE edema Psychiatric: Pt behavior is normal without agitation  No other exam findings    Assessment & Plan:

## 2016-12-28 NOTE — Patient Instructions (Addendum)
Please take all new medication as prescribed - the antibiotic, and ibuprofen for pain  Please continue all other medications as before, and refills have been done if requested.  Please have the pharmacy call with any other refills you may need.  Please keep your appointments with your specialists as you may have planned  Please return in 3 months, or sooner if needed, with Lab testing done 3-5 days before

## 2016-12-30 DIAGNOSIS — J069 Acute upper respiratory infection, unspecified: Secondary | ICD-10-CM | POA: Insufficient documentation

## 2016-12-30 NOTE — Assessment & Plan Note (Signed)
stable overall by history and exam, recent data reviewed with pt, and pt to continue medical treatment as before,  to f/u any worsening symptoms or concerns BP Readings from Last 3 Encounters:  12/28/16 124/84  05/17/16 130/78  10/13/14 (!) 150/92

## 2016-12-30 NOTE — Assessment & Plan Note (Signed)
stable overall by history and exam, and pt to continue medical treatment as before,  to f/u any worsening symptoms or concerns 

## 2016-12-30 NOTE — Assessment & Plan Note (Signed)
Mild to mod, for antibx course,  to f/u any worsening symptoms or concerns 

## 2017-04-04 ENCOUNTER — Other Ambulatory Visit: Payer: Self-pay | Admitting: Internal Medicine

## 2017-04-27 ENCOUNTER — Other Ambulatory Visit: Payer: Self-pay | Admitting: Internal Medicine

## 2017-07-05 ENCOUNTER — Telehealth: Payer: Self-pay | Admitting: Internal Medicine

## 2017-07-05 MED ORDER — OSELTAMIVIR PHOSPHATE 75 MG PO CAPS: 75.0000 mg | ORAL_CAPSULE | Freq: Two times a day (BID) | ORAL | 0 refills | Status: DC

## 2017-07-05 NOTE — Telephone Encounter (Signed)
Laura [name] 07/05/2017 10:18 AM  Summary: wrong pharmacy    Patient called back and said that her oseltamivir (TAMIFLU) 75 MG capsule was sent to [facility] and not [facility] like she had ask for. Please correct this and send it to [facility], thanks.

## 2017-07-05 NOTE — Telephone Encounter (Signed)
Ok, done to [facility]

## 2017-07-05 NOTE — Telephone Encounter (Signed)
Pt has been informed and expressed understanding.  

## 2017-07-05 NOTE — Addendum Note (Signed)
Addended by: Juliet Rude on: 07/05/2017 11:07 AM   Modules accepted: Orders

## 2017-07-05 NOTE — Telephone Encounter (Signed)
Copied from [place]. Topic: General - Other >> Jul 05, 2017  8:03 AM Yvette Rack wrote: Reason for CRM: patient would like a RX for Tamiflu her son was DX with Flu A yesterday she has cough and throat irritation  [facility] [place] ,[id number]  phone number [phone]

## 2017-07-26 ENCOUNTER — Other Ambulatory Visit: Payer: Self-pay | Admitting: Internal Medicine

## 2017-10-11 ENCOUNTER — Encounter: Payer: Self-pay | Admitting: Internal Medicine

## 2017-10-11 ENCOUNTER — Other Ambulatory Visit: Payer: Managed Care, Other (non HMO)

## 2017-10-11 ENCOUNTER — Other Ambulatory Visit (INDEPENDENT_AMBULATORY_CARE_PROVIDER_SITE_OTHER): Payer: Managed Care, Other (non HMO)

## 2017-10-11 ENCOUNTER — Ambulatory Visit: Payer: Managed Care, Other (non HMO) | Admitting: Internal Medicine

## 2017-10-11 VITALS — BP 118/78 | HR 99 | Temp 98.5°F | Ht 60.0 in | Wt 227.0 lb

## 2017-10-11 DIAGNOSIS — K146 Glossodynia: Secondary | ICD-10-CM | POA: Diagnosis not present

## 2017-10-11 DIAGNOSIS — E042 Nontoxic multinodular goiter: Secondary | ICD-10-CM

## 2017-10-11 DIAGNOSIS — Z0000 Encounter for general adult medical examination without abnormal findings: Secondary | ICD-10-CM | POA: Diagnosis not present

## 2017-10-11 DIAGNOSIS — J309 Allergic rhinitis, unspecified: Secondary | ICD-10-CM

## 2017-10-11 DIAGNOSIS — D509 Iron deficiency anemia, unspecified: Secondary | ICD-10-CM

## 2017-10-11 DIAGNOSIS — I10 Essential (primary) hypertension: Secondary | ICD-10-CM

## 2017-10-11 LAB — LIPID PANEL
Cholesterol: 174 mg/dL (ref 0–200)
HDL: 48.3 mg/dL (ref >39.00)
LDL Cholesterol: 106 mg/dL — ABNORMAL HIGH (ref 0–99)
NONHDL: 125.41
Total CHOL/HDL Ratio: 4
Triglycerides: 98 mg/dL (ref 0.0–149.0)
VLDL: 19.6 mg/dL (ref 0.0–40.0)

## 2017-10-11 LAB — URINALYSIS, ROUTINE W REFLEX MICROSCOPIC
Bilirubin Urine: NEGATIVE
KETONES UR: NEGATIVE
LEUKOCYTES UA: NEGATIVE
Nitrite: NEGATIVE
PH: 6 (ref 5.0–8.0)
Specific Gravity, Urine: 1.02 (ref 1.000–1.030)
Total Protein, Urine: NEGATIVE
Urine Glucose: NEGATIVE
Urobilinogen, UA: 0.2 (ref 0.0–1.0)

## 2017-10-11 LAB — BASIC METABOLIC PANEL
BUN: 12 mg/dL (ref 6–23)
CALCIUM: 9.1 mg/dL (ref 8.4–10.5)
CO2: 28 mEq/L (ref 19–32)
CREATININE: 0.8 mg/dL (ref 0.40–1.20)
Chloride: 103 mEq/L (ref 96–112)
GFR: 100.91 mL/min (ref >60.00)
GLUCOSE: 85 mg/dL (ref 70–99)
Potassium: 3.7 mEq/L (ref 3.5–5.1)
Sodium: 137 mEq/L (ref 135–145)

## 2017-10-11 LAB — CBC WITH DIFFERENTIAL/PLATELET
BASOS PCT: 0.8 % (ref 0.0–3.0)
Basophils Absolute: 0 10*3/uL (ref 0.0–0.1)
Eosinophils Absolute: 0.3 10*3/uL (ref 0.0–0.7)
Eosinophils Relative: 4.5 % (ref 0.0–5.0)
HEMATOCRIT: 31.6 % — AB (ref 36.0–46.0)
HEMOGLOBIN: 10 g/dL — AB (ref 12.0–15.0)
LYMPHS PCT: 33.6 % (ref 12.0–46.0)
Lymphs Abs: 2 10*3/uL (ref 0.7–4.0)
MCHC: 31.5 g/dL (ref 30.0–36.0)
MCV: 73.8 fl — ABNORMAL LOW (ref 78.0–100.0)
MONO ABS: 0.7 10*3/uL (ref 0.1–1.0)
Monocytes Relative: 11.9 % (ref 3.0–12.0)
Neutro Abs: 2.9 10*3/uL (ref 1.4–7.7)
Neutrophils Relative %: 49.2 % (ref 43.0–77.0)
Platelets: 314 10*3/uL (ref 150.0–400.0)
RBC: 4.28 Mil/uL (ref 3.87–5.11)
RDW: 17.5 % — ABNORMAL HIGH (ref 11.5–15.5)
WBC: 5.9 10*3/uL (ref 4.0–10.5)

## 2017-10-11 LAB — HEPATIC FUNCTION PANEL
ALT: 16 U/L (ref 0–35)
AST: 13 U/L (ref 0–37)
Albumin: 4.2 g/dL (ref 3.5–5.2)
Alkaline Phosphatase: 43 U/L (ref 39–117)
BILIRUBIN DIRECT: 0.1 mg/dL (ref 0.0–0.3)
BILIRUBIN TOTAL: 0.3 mg/dL (ref 0.2–1.2)
TOTAL PROTEIN: 7.4 g/dL (ref 6.0–8.3)

## 2017-10-11 LAB — T4, FREE: FREE T4: 0.82 ng/dL (ref 0.60–1.60)

## 2017-10-11 LAB — TSH: TSH: 1.13 u[IU]/mL (ref 0.35–4.50)

## 2017-10-11 MED ORDER — CLOTRIMAZOLE 10 MG MT TROC: 10.0000 mg | Freq: Every day | OROMUCOSAL | 1 refills | Status: DC

## 2017-10-11 MED ORDER — FUROSEMIDE 40 MG PO TABS: 40.0000 mg | ORAL_TABLET | Freq: Every day | ORAL | 1 refills | Status: DC

## 2017-10-11 MED ORDER — POTASSIUM CHLORIDE ER 10 MEQ PO TBCR: 10.0000 meq | EXTENDED_RELEASE_TABLET | Freq: Every day | ORAL | 1 refills | Status: DC

## 2017-10-11 MED ORDER — AZELASTINE HCL 0.05 % OP SOLN: 1.0000 [drp] | Freq: Two times a day (BID) | OPHTHALMIC | 12 refills | Status: DC

## 2017-10-11 NOTE — Patient Instructions (Addendum)
Please take all new medication as prescribed - the mycelex troches as needed for the tongue, and the Optivar for the eyes  Please continue all other medications as before, and refills have been done if requested.  Please have the pharmacy call with any other refills you may need.  Please continue your efforts at being more active, low cholesterol diet, and weight control.  You are otherwise up to date with prevention measures today.  Please keep your appointments with your specialists as you may have planned  You will be contacted regarding the referral for: Endocrinology for thyroid, and ENT for tongue  Please go to the LAB in the Basement (turn left off the elevator) for the tests to be done today  You will be contacted by phone if any changes need to be made immediately.  Otherwise, you will receive a letter about your results with an explanation, but please check with MyChart first.  Please remember to sign up for MyChart if you have not done so, as this will be important to you in the future with finding out test results, communicating by private email, and scheduling acute appointments online when needed.  Please return in 1 year for your yearly visit, or sooner if needed

## 2017-10-11 NOTE — Progress Notes (Signed)
Subjective:    Patient ID: Laura [name], female    DOB: 19-Aug-1975, 42 y.o.   MRN: [id number]  HPI   Here for wellness and f/u;  Overall doing ok;  Pt denies Chest pain, worsening SOB, DOE, wheezing, orthopnea, PND, worsening LE edema, palpitations, dizziness or syncope.  Pt denies neurological change such as new headache, facial or extremity weakness.  Pt denies polydipsia, polyuria, or low sugar symptoms. Pt states overall good compliance with treatment and medications, good tolerability, and has been trying to follow appropriate diet.  Pt denies worsening depressive symptoms, suicidal ideation or panic. No fever, night sweats, wt loss, loss of appetite, or other constitutional symptoms.  Pt states good ability with ADL's, has low fall risk, home safety reviewed and adequate, no other significant changes in hearing or vision, and only occasionally active with exercise.  At last visit aug 2018 tx did not help chronic tonsil enlarged; also has had some papillae enlargement and bad breath ongoing for which she is very concerned. Does have several wks ongoing nasal allergy symptoms with clearish congestion, itch and sneezing, without fever, pain, ST, cough, swelling or wheezing. 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  Denies hyper or hypo thyroid symptoms such as voice, skin or hair change.  No other new complaints or interval hx. Past Medical History:  Diagnosis Date  . ALLERGIC RHINITIS 06/01/2008  . Anxiety 08/15/2010  . ASTHMA 06/01/2008  . BURSITIS, LEFT HIP 04/28/2010  . GOITER, MULTINODULAR 06/28/2008  . Hirsutism 06/02/2009  . HYPERTENSION 06/01/2008  . Palpitations 07/25/2010  . PERIPHERAL EDEMA 04/28/2010  . Pharyngitis, acute 10/09/2010  . TONSILLITIS, RECURRENT 05/05/2009   Past Surgical History:  Procedure Laterality Date  . CESAREAN SECTION  2007  . s/p sinus surgury  2006    reports that she has never smoked. She has never used smokeless tobacco. She reports  that she does not drink alcohol or use drugs. family history includes Hyperlipidemia in her maternal grandfather, maternal grandmother, and mother; Hypertension in her maternal grandfather, maternal grandmother, and mother; Lung cancer in her maternal grandfather. No Known Allergies Current Outpatient Medications on File Prior to Visit  Medication Sig Dispense Refill  . albuterol (PROVENTIL HFA;VENTOLIN HFA) 108 (90 BASE) MCG/ACT inhaler Inhale 2 puffs into the lungs every 4 (four) hours as needed. 3 Inhaler 3  . calcium carbonate (TUMS - DOSED IN MG ELEMENTAL CALCIUM) 500 MG chewable tablet Chew 2 tablets by mouth daily. As needed daily for heartburn/indigestion     . fluticasone (FLONASE) 50 MCG/ACT nasal spray USE 2 SPRAYS IN BOTH NOSTRILS DAILY 16 g 2  . ibuprofen (ADVIL,MOTRIN) 800 MG tablet Take 1 tablet (800 mg total) by mouth every 8 (eight) hours as needed. 30 tablet 0  . irbesartan (AVAPRO) 150 MG tablet TAKE 1 TABLET DAILY **PATIENT INEEDS A PHYSICAL FOR ADDITIONAL REFILLS** 90 tablet 0  . irbesartan (AVAPRO) 150 MG tablet TAKE 1 TABLET DAILY 90 tablet 1  . labetalol (NORMODYNE) 300 MG tablet TAKE 1 TABLET TWICE A DAY 180 tablet 2  . naproxen (NAPROSYN) 500 MG tablet Take 1 tablet (500 mg total) by mouth 2 (two) times daily with a meal. 60 tablet 2  . pantoprazole (PROTONIX) 40 MG tablet TAKE 1 TABLET(40 MG) BY MOUTH DAILY 90 tablet 3  . Prenatal Vit-Fe Fumarate-FA (PRENATAL PLUS/IRON) 27-1 MG TABS Take by mouth daily.       No current facility-administered medications on file prior to visit.  Review of Systems Constitutional: Negative for other unusual diaphoresis, sweats, appetite or weight changes HENT: Negative for other worsening hearing loss, ear pain, facial swelling, mouth sores or neck stiffness.   Eyes: Negative for other worsening pain, redness or other visual disturbance.  Respiratory: Negative for other stridor or swelling Cardiovascular: Negative for other  palpitations or other chest pain  Gastrointestinal: Negative for worsening diarrhea or loose stools, blood in stool, distention or other pain Genitourinary: Negative for hematuria, flank pain or other change in urine volume.  Musculoskeletal: Negative for myalgias or other joint swelling.  Skin: Negative for other color change, or other wound or worsening drainage.  Neurological: Negative for other syncope or numbness. Hematological: Negative for other adenopathy or swelling Psychiatric/Behavioral: Negative for hallucinations, other worsening agitation, SI, self-injury, or new decreased concentration All other system neg per pt    Objective:   Physical Exam BP 118/78   Pulse 99   Temp 98.5 F (36.9 C) (Oral)   Ht 5' (1.524 m)   Wt 227 lb (103 kg)   SpO2 96%   BMI 44.33 kg/m  VS noted,  Constitutional: Pt is oriented to person, place, and time. Appears well-developed and well-nourished, in no significant distress and comfortable Head: Normocephalic and atraumatic  Eyes: Conjunctivae and EOM are normal. Pupils are equal, round, and reactive to light Bilat tm's with mild erythema.  Max sinus areas non tender.  Pharynx with mild erythema, no exudate Right Ear: External ear normal without discharge Left Ear: External ear normal without discharge Nose: Nose without discharge or deformity Mouth/Throat: Oropharynx is without other ulcerations and moist  Neck: Normal range of motion. Neck supple. No JVD present. No tracheal deviation present or significant neck LA or mass Cardiovascular: Normal rate, regular rhythm, normal heart sounds and intact distal pulses.   Pulmonary/Chest: WOB normal and breath sounds without rales or wheezing  Abdominal: Soft. Bowel sounds are normal. NT. No HSM  Musculoskeletal: Normal range of motion. Exhibits no edema Lymphadenopathy: Has no other cervical adenopathy.  Neurological: Pt is alert and oriented to person, place, and time. Pt has normal reflexes. No  cranial nerve deficit. Motor grossly intact, Gait intact Skin: Skin is warm and dry. No rash noted or new ulcerations Psychiatric:  Has normal mood and affect. Behavior is normal without agitation No other exam findings    Assessment & Plan:

## 2017-10-12 ENCOUNTER — Other Ambulatory Visit: Payer: Self-pay | Admitting: Internal Medicine

## 2017-10-12 ENCOUNTER — Encounter: Payer: Self-pay | Admitting: Internal Medicine

## 2017-10-12 DIAGNOSIS — D509 Iron deficiency anemia, unspecified: Secondary | ICD-10-CM

## 2017-10-12 NOTE — Assessment & Plan Note (Signed)
With conjunctivitis - for optivar asd,  to f/u any worsening symptoms or concerns

## 2017-10-12 NOTE — Assessment & Plan Note (Addendum)
C/w thrush  - for mycelex troche prn  In addition to the time spent performing CPE, I spent an additional 25 minutes face to face,in which greater than 50% of this time was spent in counseling and coordination of care for patient's acute illness as documented, including the differential dx, treatment, further evaluation and other management of tongue pain, goiter, allergic conjunctivitis, HTN and anemia

## 2017-10-12 NOTE — Assessment & Plan Note (Signed)
Due for f/u endo visit, will refer

## 2017-10-12 NOTE — Assessment & Plan Note (Signed)
stable overall by history and exam, recent data reviewed with pt, and pt to continue medical treatment as before,  to f/u any worsening symptoms or concerns BP Readings from Last 3 Encounters:  10/11/17 118/78  12/28/16 124/84  05/17/16 130/78

## 2017-10-12 NOTE — Assessment & Plan Note (Signed)

## 2017-10-12 NOTE — Assessment & Plan Note (Signed)
For f/u lab today 

## 2017-10-14 ENCOUNTER — Telehealth: Payer: Self-pay

## 2017-10-14 NOTE — Telephone Encounter (Signed)
Pt has viewed results via MyChart  

## 2017-10-14 NOTE — Telephone Encounter (Signed)
-----   Message from Biagio Borg, MD sent at 10/12/2017 12:00 PM EDT ----- Left message on MyChart, pt to cont same tx except  The test results show that your current treatment is OK, except there is new worsening anemia compared to normal at your last labs.  The iron level was normal before, but I suspect may be low again.  Please return to the lab on Monday or as you can, to have an Iron panel done.  I will ask my staff to ask as well if you have been having irregular or heavy menses in the last 6 months., as this is a common cause, for which you would need to see your GYN     Tamee Battin to please inform pt, I will do order

## 2017-10-23 ENCOUNTER — Encounter: Payer: Self-pay | Admitting: Internal Medicine

## 2017-10-23 NOTE — Telephone Encounter (Signed)
Laura [name] or Laura [name] to address pt concern, thanks

## 2017-10-29 NOTE — Telephone Encounter (Signed)
PCC's to see pt concern, thanks

## 2017-12-03 ENCOUNTER — Other Ambulatory Visit (HOSPITAL_COMMUNITY): Payer: Self-pay | Admitting: Endocrinology

## 2017-12-03 ENCOUNTER — Ambulatory Visit (HOSPITAL_COMMUNITY)
Admission: RE | Admit: 2017-12-03 | Discharge: 2017-12-03 | Disposition: A | Discharge status: Home / Self Care | Payer: Managed Care, Other (non HMO) | Source: Ambulatory Visit | Attending: Endocrinology | Admitting: Endocrinology

## 2017-12-03 DIAGNOSIS — E049 Nontoxic goiter, unspecified: Secondary | ICD-10-CM

## 2017-12-03 DIAGNOSIS — E042 Nontoxic multinodular goiter: Secondary | ICD-10-CM | POA: Insufficient documentation

## 2017-12-05 ENCOUNTER — Encounter: Payer: Self-pay | Admitting: Internal Medicine

## 2017-12-05 ENCOUNTER — Ambulatory Visit: Payer: Managed Care, Other (non HMO) | Admitting: Internal Medicine

## 2017-12-05 ENCOUNTER — Ambulatory Visit: Payer: Self-pay | Admitting: Internal Medicine

## 2017-12-05 VITALS — BP 146/110 | HR 95 | Temp 98.1°F | Ht 60.0 in

## 2017-12-05 DIAGNOSIS — E669 Obesity, unspecified: Secondary | ICD-10-CM | POA: Insufficient documentation

## 2017-12-05 DIAGNOSIS — I10 Essential (primary) hypertension: Secondary | ICD-10-CM

## 2017-12-05 DIAGNOSIS — K219 Gastro-esophageal reflux disease without esophagitis: Secondary | ICD-10-CM | POA: Insufficient documentation

## 2017-12-05 NOTE — Telephone Encounter (Addendum)
Pt stated that she was at a ENT appointment and using a wrist BP monitor her BP was taken 3 times. 166/102, 188/1?? (pt could not recall diastolic) and 591/638. Pt denies headache, chest pain, blurred vision  Reason for Disposition . Systolic BP  >= 466 OR Diastolic >= 599  Answer Assessment - Initial Assessment Questions 1. BLOOD PRESSURE: "What is the blood pressure?" "Did you take at least two measurements 5 minutes apart?"     166/102 and 188/1?? 200/120 2. ONSET: "When did you take your blood pressure?"     Taken at ENT 3:00 3. HOW: "How did you obtain the blood pressure?" (e.g., visiting nurse, automatic home BP monitor)     At ENT office with a wrist BP monitor 4. HISTORY: "Do you have a history of high blood pressure?"     yes 5. MEDICATIONS: "Are you taking any medications for blood pressure?" "Have you missed any doses recently?"     Yes- no doses missed does not take the irbesartan 6. OTHER SYMPTOMS: "Do you have any symptoms?" (e.g., headache, chest pain, blurred vision, difficulty breathing, weakness)     no 7. PREGNANCY: "Is there any chance you are pregnant?" "When was your last menstrual period?"     No LMP: on menses  Protocols used: HIGH BLOOD PRESSURE-A-AH

## 2017-12-05 NOTE — Patient Instructions (Signed)
The EKG of the heart is normal.   I do not think we need to make changes today to blood pressure medicines.   Watch the blood pressure daily for the next week and call us if it is higher than your normal.

## 2017-12-05 NOTE — Telephone Encounter (Signed)
No difficulty breathing or weakness. Pt given 3:40 appt to see provider.Care advice given per protocol.

## 2017-12-05 NOTE — Progress Notes (Signed)
   Subjective:    Patient ID: Laura [name], female    DOB: [birth year], 42 y.o.   MRN: [id number]  HPI The patient is a 42 YO female coming in for blood pressure high. She was at ENT to discuss possible surgery for goiter. She did not end up needing biopsy or surgery. She had high BP to 180-200/100-110 at that office. They took it 3 times with wrist monitor which kept elevating. She is not taking irbesartan and has not been for many months. BP at endo yesterday was normal for her 110/70s. Her BP at our office has previously been at goal and consistent with that reading. She is feeling normal today other than being very concerned about her blood pressure.   Review of Systems  Constitutional: Negative.   HENT: Negative.   Eyes: Negative.   Respiratory: Negative for cough, chest tightness and shortness of breath.   Cardiovascular: Negative for chest pain, palpitations and leg swelling.  Gastrointestinal: Negative for abdominal distention, abdominal pain, constipation, diarrhea, nausea and vomiting.  Musculoskeletal: Negative.   Skin: Negative.   Neurological: Negative.   Psychiatric/Behavioral: Negative.       Objective:   Physical Exam  Constitutional: She is oriented to person, place, and time. She appears well-developed and well-nourished.  HENT:  Head: Normocephalic and atraumatic.  Eyes: EOM are normal.  Neck: Normal range of motion.  Cardiovascular: Normal rate and regular rhythm.  Pulmonary/Chest: Effort normal and breath sounds normal. No respiratory distress. She has no wheezes. She has no rales.  Abdominal: Soft. Bowel sounds are normal. She exhibits no distension. There is no tenderness. There is no rebound.  Musculoskeletal: She exhibits no edema.  Neurological: She is alert and oriented to person, place, and time. Coordination normal.  Skin: Skin is warm and dry.  Psychiatric: She has a normal mood and affect.   Vitals:   12/05/17 1530  BP: (!) 146/110  Pulse:  95  Temp: 98.1 F (36.7 C)  TempSrc: Oral  SpO2: 99%  Height: 5' (1.524 m)      Assessment & Plan:

## 2017-12-06 NOTE — Assessment & Plan Note (Signed)
Suspect blood pressure high due to stress reaction. Would not alter therapy based on today's readings. She has the capacity to check at home and asked to monitor daily for the next week and then call back with readings. Taking lasix and labetalol only and has not taken irbesartan for months due to perception of cancer risk.

## 2017-12-10 ENCOUNTER — Other Ambulatory Visit: Payer: Self-pay | Admitting: Internal Medicine

## 2017-12-10 ENCOUNTER — Ambulatory Visit: Payer: Self-pay | Admitting: Allergy and Immunology

## 2017-12-10 MED ORDER — IRBESARTAN 150 MG PO TABS: ORAL_TABLET | ORAL | 2 refills | Status: DC

## 2017-12-11 ENCOUNTER — Other Ambulatory Visit: Payer: Self-pay | Admitting: Internal Medicine

## 2017-12-11 MED ORDER — LOSARTAN POTASSIUM 50 MG PO TABS: ORAL_TABLET | ORAL | 3 refills | Status: DC

## 2017-12-11 NOTE — Telephone Encounter (Signed)
Pharmacy comment: Product Backordered/Unavailable:UNAVAILABLE.

## 2018-01-09 ENCOUNTER — Ambulatory Visit: Payer: Managed Care, Other (non HMO) | Admitting: Allergy & Immunology

## 2018-04-05 ENCOUNTER — Other Ambulatory Visit: Payer: Self-pay | Admitting: Internal Medicine

## 2018-06-10 ENCOUNTER — Encounter (INDEPENDENT_AMBULATORY_CARE_PROVIDER_SITE_OTHER): Payer: 59

## 2018-06-25 ENCOUNTER — Ambulatory Visit (INDEPENDENT_AMBULATORY_CARE_PROVIDER_SITE_OTHER): Payer: 59 | Admitting: Bariatrics

## 2018-06-25 ENCOUNTER — Encounter (INDEPENDENT_AMBULATORY_CARE_PROVIDER_SITE_OTHER): Payer: Self-pay | Admitting: Bariatrics

## 2018-06-25 VITALS — BP 160/107 | HR 84 | Temp 97.6°F | Ht 60.0 in | Wt 219.0 lb

## 2018-06-25 DIAGNOSIS — E049 Nontoxic goiter, unspecified: Secondary | ICD-10-CM

## 2018-06-25 DIAGNOSIS — F3289 Other specified depressive episodes: Secondary | ICD-10-CM

## 2018-06-25 DIAGNOSIS — Z6841 Body Mass Index (BMI) 40.0 and over, adult: Secondary | ICD-10-CM

## 2018-06-25 DIAGNOSIS — Z0289 Encounter for other administrative examinations: Secondary | ICD-10-CM

## 2018-06-25 DIAGNOSIS — Z9189 Other specified personal risk factors, not elsewhere classified: Secondary | ICD-10-CM

## 2018-06-25 DIAGNOSIS — R5383 Other fatigue: Secondary | ICD-10-CM | POA: Diagnosis not present

## 2018-06-25 DIAGNOSIS — D508 Other iron deficiency anemias: Secondary | ICD-10-CM | POA: Diagnosis not present

## 2018-06-25 DIAGNOSIS — J45909 Unspecified asthma, uncomplicated: Secondary | ICD-10-CM

## 2018-06-25 DIAGNOSIS — E66813 Obesity, class 3: Secondary | ICD-10-CM

## 2018-06-25 DIAGNOSIS — I10 Essential (primary) hypertension: Secondary | ICD-10-CM

## 2018-06-26 LAB — COMPREHENSIVE METABOLIC PANEL
ALBUMIN: 4.4 g/dL (ref 3.8–4.8)
ALT: 15 IU/L (ref 0–32)
AST: 11 IU/L (ref 0–40)
Albumin/Globulin Ratio: 1.8 (ref 1.2–2.2)
Alkaline Phosphatase: 51 IU/L (ref 39–117)
BUN / CREAT RATIO: 12 (ref 9–23)
BUN: 10 mg/dL (ref 6–24)
Bilirubin Total: 0.4 mg/dL (ref 0.0–1.2)
CALCIUM: 9.3 mg/dL (ref 8.7–10.2)
CO2: 22 mmol/L (ref 20–29)
CREATININE: 0.83 mg/dL (ref 0.57–1.00)
Chloride: 101 mmol/L (ref 96–106)
GFR, EST AFRICAN AMERICAN: 100 mL/min/{1.73_m2} (ref >59)
GFR, EST NON AFRICAN AMERICAN: 87 mL/min/{1.73_m2} (ref >59)
GLUCOSE: 84 mg/dL (ref 65–99)
Globulin, Total: 2.4 g/dL (ref 1.5–4.5)
Potassium: 4.1 mmol/L (ref 3.5–5.2)
Sodium: 138 mmol/L (ref 134–144)
TOTAL PROTEIN: 6.8 g/dL (ref 6.0–8.5)

## 2018-06-26 LAB — INSULIN, RANDOM: INSULIN: 17.2 u[IU]/mL (ref 2.6–24.9)

## 2018-06-26 LAB — HEMOGLOBIN A1C
Est. average glucose Bld gHb Est-mCnc: 117 mg/dL
Hgb A1c MFr Bld: 5.7 % — ABNORMAL HIGH (ref 4.8–5.6)

## 2018-06-26 LAB — VITAMIN D 25 HYDROXY (VIT D DEFICIENCY, FRACTURES): Vit D, 25-Hydroxy: 15.6 ng/mL — ABNORMAL LOW (ref 30.0–100.0)

## 2018-06-26 NOTE — Progress Notes (Signed)
Office: [phone]  /  Fax: [phone]   Dear Dr. Garwin Brothers,   Thank you for referring Laura [name] to our clinic. The following note includes my evaluation and treatment recommendations.  HPI:   Chief Complaint: OBESITY    Laura [name] has been referred by Servando Salina, MD for consultation regarding her obesity and obesity related comorbidities.    Laura [name] (MR# [id number]) is a 43 y.o. female who presents on 06/25/2018 for obesity evaluation and treatment. Current BMI is Body mass index is 42.77 kg/m.Marland Kitchen Laura [name] has been struggling with her weight for many years and has been unsuccessful in either losing weight, maintaining weight loss, or reaching her healthy weight goal.     Laura [name] attended our information session and states she is currently in the action stage of change and ready to dedicate time achieving and maintaining a healthier weight. [name] is interested in becoming our patient and working on intensive lifestyle modifications including (but not limited to) diet, exercise and weight loss.    Srishti states her family eats meals together she thinks her family will eat healthier with her her desired weight loss is 99 to 109 lbs she has been heavy most of  her life she started gaining weight approximately 15 yrs ago her heaviest weight ever was 220 lbs. she describes herself as a "picky eater" she does crave carbohydrates she skips some meals she does eat out frequently she frequently makes poor food choices she at times eats larger portions than normal  she struggles with emotional eating    Asthma [name] has a diagnosis of asthma and she is not using an inhaler at this time. Activities do not cause shortness of breath.  Hypertension Laura [name] is a 43 y.o. female with hypertension. Her blood pressure is elevated today "white coat" and at home her blood pressure at home is 130 to 140/90's. She is taking Labetalol,  Losartan and Furosemide. Laura [name] denies chest pain or shortness of breath on exertion. She is working weight loss to help control her blood pressure with the goal of decreasing her risk of heart attack and stroke. Laura [name] blood pressure is not currently controlled.  At risk for cardiovascular disease [name] is at a higher than average risk for cardiovascular disease due to obesity and hypertension. She currently denies any chest pain.  Iron Deficiency Anemia Laura [name] has a diagnosis of anemia.  She is currently taking Feosol.  Goiter Laura [name] has a diagnosis of goiter. Her TSH was within normal limits.  Depression with emotional eating behaviors Laura [name] is struggling with emotional eating and using food for comfort to the extent that it is negatively impacting her health. She often snacks when she is not hungry. [name] sometimes feels she is out of control and then feels guilty that she made poor food choices. She is attempting to work on behavior modification techniques to help reduce her emotional eating. She shows no sign of suicidal or homicidal ideations. PHQ-9 score is 13  Depression Screen Laura [name]'s Food and Mood (modified PHQ-9) score was  Depression screen PHQ 2/9 06/25/2018  Decreased Interest 3  Down, Depressed, Hopeless 2  PHQ - 2 Score 5  Altered sleeping 3  Tired, decreased energy 3  Change in appetite 2  Feeling bad or failure about yourself  0  Trouble concentrating 0  Moving slowly or fidgety/restless 0  Suicidal thoughts 0  PHQ-9 Score 13  Difficult doing work/chores Not difficult at all    ASSESSMENT AND  PLAN:  Other fatigue - Plan: EKG 12-Lead, Hemoglobin A1c, Insulin, random, VITAMIN D 25 Hydroxy (Vit-D Deficiency, Fractures)  Essential hypertension  Other iron deficiency anemia - Plan: Comprehensive metabolic panel  Goiter - Plan: Hemoglobin A1c, Insulin, random  Asthma, unspecified asthma severity, unspecified whether complicated,  unspecified whether persistent  Other depression - with emotional eating  At risk for heart disease  Class 3 severe obesity with serious comorbidity and body mass index (BMI) of 40.0 to 44.9 in adult, unspecified obesity type ([place])  PLAN:  Asthma [name] will follow up with her PCP. She will follow up with our clinic in 2 weeks.  Hypertension We discussed sodium restriction, working on healthy weight loss, and a regular exercise program as the means to achieve improved blood pressure control. Laura [name] agreed with this plan and agreed to follow up as directed. We will continue to monitor her blood pressure as well as her progress with the above lifestyle modifications. She will continue her medications as prescribed and will watch for signs of hypotension as she continues her lifestyle modifications.  Cardiovascular risk counseling Laura [name] was given extended (15 minutes) coronary artery disease prevention counseling today. She is 43 y.o. female and has risk factors for heart disease including obesity and hypertension. We discussed intensive lifestyle modifications today with an emphasis on specific weight loss instructions and strategies. Pt was also informed of the importance of increasing exercise and decreasing saturated fats to help prevent heart disease.  Iron Deficiency Anemia The diagnosis of Iron deficiency anemia was discussed with Laura [name] and was explained in detail. She was given suggestions of iron rich foods and iron supplement. She will continue [facility] and will follow up with her PCP.   Goiter Her PCP will follow her goiter. Laura [name] agrees to follow up with our clinic in 2 weeks.  Depression with Emotional Eating Behaviors We discussed behavior modification techniques today to help Laura [name] deal with her emotional eating and depression. She will not keep "tempting" foods at home. [name] will follow up as directed.  Depression Screen Laura [name] had a moderately positive  depression screening. Depression is commonly associated with obesity and often results in emotional eating behaviors. We will monitor this closely and work on CBT to help improve the non-hunger eating patterns. Referral to Psychology may be required if no improvement is seen as she continues in our clinic.  Obesity Laura [name] is currently in the action stage of change and her goal is to continue with weight loss efforts. I recommend Laura [name] begin the structured treatment plan as follows:  She has agreed to follow the Category 3 plan +100 calories Laura [name] has been instructed to eventually work up to a goal of 150 minutes of combined cardio and strengthening exercise per week for weight loss and overall health benefits. We discussed the following Behavioral Modification Strategies today: increase H2O intake, keeping healthy foods in the home, increasing lean protein intake, decreasing simple carbohydrates, increasing vegetables and work on meal planning and easy cooking plans   She was informed of the importance of frequent follow up visits to maximize her success with intensive lifestyle modifications for her multiple health conditions. She was informed we would discuss her lab results at her next visit unless there is a critical issue that needs to be addressed sooner. Leland agreed to keep her next visit at the agreed upon time to discuss these results.  ALLERGIES: No Known Allergies  MEDICATIONS: Current Outpatient Medications on File Prior to Visit  Medication Sig Dispense Refill  .  ferrous sulfate 220 (44 Fe) MG/5ML solution Take 220 mg by mouth daily.    . fluticasone (FLONASE) 50 MCG/ACT nasal spray USE 2 SPRAYS IN BOTH NOSTRILS DAILY 16 g 2  . furosemide (LASIX) 40 MG tablet TAKE 1 TABLET BY MOUTH EVERY DAY 90 tablet 1  . KLOR-CON M10 10 MEQ tablet TAKE 1 TABLET BY MOUTH EVERY DAY 90 tablet 1  . labetalol (NORMODYNE) 300 MG tablet TAKE 1 TABLET TWICE A DAY 180 tablet 2  . losartan  (COZAAR) 50 MG tablet Please specify directions, refills and quantity 90 tablet 3  . naproxen (NAPROSYN) 500 MG tablet Take 1 tablet (500 mg total) by mouth 2 (two) times daily with a meal. 60 tablet 2  . albuterol (PROVENTIL HFA;VENTOLIN HFA) 108 (90 BASE) MCG/ACT inhaler Inhale 2 puffs into the lungs every 4 (four) hours as needed. (Patient not taking: Reported on 06/25/2018) 3 Inhaler 3  . azelastine (OPTIVAR) 0.05 % ophthalmic solution Place 1 drop into both eyes 2 (two) times daily. (Patient not taking: Reported on 06/25/2018) 6 mL 12  . calcium carbonate (TUMS - DOSED IN MG ELEMENTAL CALCIUM) 500 MG chewable tablet Chew 2 tablets by mouth daily. As needed daily for heartburn/indigestion     . clotrimazole (MYCELEX) 10 MG troche Take 1 tablet (10 mg total) by mouth 5 (five) times daily. (Patient not taking: Reported on 06/25/2018) 180 tablet 1  . ibuprofen (ADVIL,MOTRIN) 800 MG tablet Take 1 tablet (800 mg total) by mouth every 8 (eight) hours as needed. (Patient not taking: Reported on 06/25/2018) 30 tablet 0  . irbesartan (AVAPRO) 150 MG tablet TAKE 1 TABLET DAILY (Patient not taking: Reported on 06/25/2018) 90 tablet 1  . pantoprazole (PROTONIX) 40 MG tablet TAKE 1 TABLET(40 MG) BY MOUTH DAILY (Patient not taking: Reported on 06/25/2018) 90 tablet 3  . Prenatal Vit-Fe Fumarate-FA (PRENATAL PLUS/IRON) 27-1 MG TABS Take by mouth daily.       No current facility-administered medications on file prior to visit.     PAST MEDICAL HISTORY: Past Medical History:  Diagnosis Date  . ALLERGIC RHINITIS 06/01/2008  . Anemia   . Anxiety 08/15/2010  . ASTHMA 06/01/2008  . BURSITIS, LEFT HIP 04/28/2010  . GOITER, MULTINODULAR 06/28/2008  . Hirsutism 06/02/2009  . HYPERTENSION 06/01/2008  . Palpitations 07/25/2010  . PERIPHERAL EDEMA 04/28/2010  . Pharyngitis, acute 10/09/2010  . TONSILLITIS, RECURRENT 05/05/2009    PAST SURGICAL HISTORY: Past Surgical History:  Procedure Laterality Date  . bilateral turbiante  reductions    . CESAREAN SECTION  2007  . s/p sinus surgury  2006    SOCIAL HISTORY: Social History   Tobacco Use  . Smoking status: Never Smoker  . Smokeless tobacco: Never Used  Substance Use Topics  . Alcohol use: No  . Drug use: No    FAMILY HISTORY: Family History  Problem Relation Age of Onset  . Hypertension Mother   . Hyperlipidemia Mother   . Hypertension Maternal Grandmother   . Hyperlipidemia Maternal Grandmother   . Hypertension Maternal Grandfather   . Hyperlipidemia Maternal Grandfather   . Lung cancer Maternal Grandfather     ROS: Review of Systems  HENT: Positive for congestion (nasal stuffiness) and sinus pain.   Eyes:       + Wear Glasses or Contacts  Respiratory: Negative for shortness of breath (on exertion).   Cardiovascular: Negative for chest pain.  Genitourinary: Positive for frequency.  Musculoskeletal: Positive for back pain.       +  Muscle or Joint Pain + Muscle Stiffness  Skin:       + Hair or Nail Changes  Psychiatric/Behavioral: The patient has insomnia.     PHYSICAL EXAM: Blood pressure (!) 160/107, pulse 84, temperature 97.6 F (36.4 C), temperature source Oral, height 5' (1.524 m), weight 219 lb (99.3 kg), last menstrual period 06/02/2018, SpO2 99 %. Body mass index is 42.77 kg/m. Physical Exam Vitals signs reviewed.  Constitutional:      Appearance: Normal appearance. She is well-developed. She is obese.  HENT:     Head: Normocephalic and atraumatic.     Nose: Nose normal.     Mouth/Throat:     Comments: Mallampati = 4 Eyes:     General: No scleral icterus.    Extraocular Movements: Extraocular movements intact.  Neck:     Musculoskeletal: Normal range of motion and neck supple.     Thyroid: No thyromegaly.  Cardiovascular:     Rate and Rhythm: Normal rate and regular rhythm.  Pulmonary:     Effort: Pulmonary effort is normal. No respiratory distress.  Abdominal:     Palpations: Abdomen is soft.     Tenderness:  There is no abdominal tenderness.  Musculoskeletal: Normal range of motion.     Comments: Range of Motion normal in all 4 extremities  Skin:    General: Skin is warm and dry.     Comments: + Acanthosis Nigricans  Neurological:     Mental Status: She is alert and oriented to person, place, and time.     Coordination: Coordination normal.  Psychiatric:        Mood and Affect: Mood normal.        Behavior: Behavior normal.     RECENT LABS AND TESTS: BMET    Component Value Date/Time   NA 138 06/25/2018 1247   K 4.1 06/25/2018 1247   CL 101 06/25/2018 1247   CO2 22 06/25/2018 1247   GLUCOSE 84 06/25/2018 1247   GLUCOSE 85 10/11/2017 1516   BUN 10 06/25/2018 1247   CREATININE 0.83 06/25/2018 1247   CALCIUM 9.3 06/25/2018 1247   GFRNONAA 87 06/25/2018 1247   GFRAA 100 06/25/2018 1247   Lab Results  Component Value Date   HGBA1C 5.7 (H) 06/25/2018   Lab Results  Component Value Date   INSULIN 17.2 06/25/2018   CBC    Component Value Date/Time   WBC 5.9 10/11/2017 1516   RBC 4.28 10/11/2017 1516   HGB 10.0 (L) 10/11/2017 1516   HCT 31.6 (L) 10/11/2017 1516   PLT 314.0 10/11/2017 1516   MCV 73.8 (L) 10/11/2017 1516   MCHC 31.5 10/11/2017 1516   RDW 17.5 (H) 10/11/2017 1516   LYMPHSABS 2.0 10/11/2017 1516   MONOABS 0.7 10/11/2017 1516   EOSABS 0.3 10/11/2017 1516   BASOSABS 0.0 10/11/2017 1516   Iron/TIBC/Ferritin/ %Sat    Component Value Date/Time   IRON 153 (H) 05/17/2016 1555   IRONPCTSAT 45.5 05/17/2016 1555   Lipid Panel     Component Value Date/Time   CHOL 174 10/11/2017 1516   TRIG 98.0 10/11/2017 1516   HDL 48.30 10/11/2017 1516   CHOLHDL 4 10/11/2017 1516   VLDL 19.6 10/11/2017 1516   LDLCALC 106 (H) 10/11/2017 1516   Hepatic Function Panel     Component Value Date/Time   PROT 6.8 06/25/2018 1247   ALBUMIN 4.4 06/25/2018 1247   AST 11 06/25/2018 1247   ALT 15 06/25/2018 1247   ALKPHOS 51 06/25/2018 1247  BILITOT 0.4 06/25/2018 1247    BILIDIR 0.1 10/11/2017 1516      Component Value Date/Time   TSH 1.13 10/11/2017 1516   TSH 0.64 05/17/2016 1555   TSH 1.32 06/25/2014 1428    ECG  shows NSR with a rate of 90 BPM INDIRECT CALORIMETER done today shows a VO2 of 304 and a REE of 2117.  Her calculated basal metabolic rate is 7116 thus her basal metabolic rate is better than expected.       OBESITY BEHAVIORAL INTERVENTION VISIT  Today's visit was # 1   Starting weight: 219 lbs Starting date: 06/25/2018 Today's weight : 219 lbs Today's date: 06/25/2018 Total lbs lost to date: 0   ASK: We discussed the diagnosis of obesity with Laura [name] Rack today and Mailee agreed to give [place] permission to discuss obesity behavioral modification therapy today.  ASSESS: Phuong has the diagnosis of obesity and her BMI today is 42.77 [place] is in the action stage of change   ADVISE: Michaeleen was educated on the multiple health risks of obesity as well as the benefit of weight loss to improve her health. She was advised of the need for long term treatment and the importance of lifestyle modifications to improve her current health and to decrease her risk of future health problems.  AGREE: Multiple dietary modification options and treatment options were discussed and  Arnell agreed to follow the recommendations documented in the above note.  ARRANGE: Lealer was educated on the importance of frequent visits to treat obesity as outlined per CMS and USPSTF guidelines and agreed to schedule her next follow up appointment today.  Corey Skains, am acting as Location manager for [employer]. Owens Shark, DO  I have reviewed the above documentation for accuracy and completeness, and I agree with the above. -Jearld Lesch, DO

## 2018-07-08 ENCOUNTER — Other Ambulatory Visit: Payer: Self-pay | Admitting: Obstetrics and Gynecology

## 2018-07-09 ENCOUNTER — Encounter (INDEPENDENT_AMBULATORY_CARE_PROVIDER_SITE_OTHER): Payer: Self-pay | Admitting: Bariatrics

## 2018-07-09 ENCOUNTER — Ambulatory Visit (INDEPENDENT_AMBULATORY_CARE_PROVIDER_SITE_OTHER): Payer: 59 | Admitting: Bariatrics

## 2018-07-09 VITALS — BP 154/95 | HR 93 | Temp 98.1°F | Ht 60.0 in | Wt 213.0 lb

## 2018-07-09 DIAGNOSIS — E559 Vitamin D deficiency, unspecified: Secondary | ICD-10-CM | POA: Diagnosis not present

## 2018-07-09 DIAGNOSIS — I10 Essential (primary) hypertension: Secondary | ICD-10-CM | POA: Diagnosis not present

## 2018-07-09 DIAGNOSIS — Z9189 Other specified personal risk factors, not elsewhere classified: Secondary | ICD-10-CM | POA: Diagnosis not present

## 2018-07-09 DIAGNOSIS — Z6841 Body Mass Index (BMI) 40.0 and over, adult: Secondary | ICD-10-CM

## 2018-07-09 DIAGNOSIS — R7303 Prediabetes: Secondary | ICD-10-CM | POA: Diagnosis not present

## 2018-07-09 MED ORDER — VITAMIN D (ERGOCALCIFEROL) 1.25 MG (50000 UNIT) PO CAPS: 50000.0000 [IU] | ORAL_CAPSULE | ORAL | 0 refills | Status: DC

## 2018-07-10 ENCOUNTER — Encounter: Payer: Self-pay | Admitting: Internal Medicine

## 2018-07-10 DIAGNOSIS — R7303 Prediabetes: Secondary | ICD-10-CM | POA: Insufficient documentation

## 2018-07-10 DIAGNOSIS — Z6841 Body Mass Index (BMI) 40.0 and over, adult: Secondary | ICD-10-CM

## 2018-07-10 MED ORDER — LABETALOL HCL 200 MG PO TABS: 400.0000 mg | ORAL_TABLET | Freq: Two times a day (BID) | ORAL | 3 refills | Status: DC

## 2018-07-10 MED ORDER — LOSARTAN POTASSIUM 100 MG PO TABS: 100.0000 mg | ORAL_TABLET | Freq: Every day | ORAL | 3 refills | Status: DC

## 2018-07-10 NOTE — Progress Notes (Signed)
Office: [phone]  /  Fax: [phone]   HPI:   Chief Complaint: OBESITY Laura [name] is here to discuss her progress with her obesity treatment plan. She is on the Category 3 plan + 100 calories and is following her eating plan approximately 80 % of the time. She states she is exercising 0 minutes 0 times per week. [name] states that she started late on the plan, but she is doing well overall. She was not hungry on the plan and she sometimes skips breakfast. She is now rarely eating out.  Her weight is 213 lb (96.6 kg) today and has had a weight loss of 6 pounds over a period of 3 weeks since her last visit. She has lost 6 lbs since starting treatment with [place].  Vitamin D deficiency Laura [name] has a diagnosis of vitamin D deficiency. She is not currently taking vit D.  At risk for osteopenia and osteoporosis Laura [name] is at higher risk of osteopenia and osteoporosis due to vitamin D deficiency.   Pre-Diabetes Laura [name] has a diagnosis of pre-diabetes based on her elevated Hgb A1c and was informed this puts her at greater risk of developing diabetes. Her last Hgb A1c was 5.7 and her Insulin was 17.2 on 06/25/18. She is not taking metformin currently and continues to work on diet and exercise to decrease risk of diabetes. She denies polyphagia.  Hypertension Laura [name] is a 43 y.o. female with hypertension. Laura [name]'s blood pressure is not currently well controlled and she notes that her home blood pressures are 130's/90's. Laura [name] is taking Lasix 40mg , irbesartan 150mg , labetalol 300mg , losartan 50mg , and she denies missing her medications. She is working on weight loss to help control her blood pressure with the goal of decreasing her risk of heart attack and stroke.  ASSESSMENT AND PLAN:  Vitamin D deficiency - Plan: Vitamin D, Ergocalciferol, (DRISDOL) 1.25 MG (50000 UT) CAPS capsule  Prediabetes  Essential hypertension  At risk for osteoporosis  Class 3 severe obesity with  serious comorbidity and body mass index (BMI) of 40.0 to 44.9 in adult, unspecified obesity type ([name])  PLAN:  Vitamin D Deficiency Laura [name] was informed that low vitamin D levels contributes to fatigue and are associated with obesity, breast, and colon cancer. She agrees to start to take prescription Vit D @50 ,000 IU every week #4 with no refills and will follow up for routine testing of vitamin D, at least 2-3 times per year. She was informed of the risk of over-replacement of vitamin D and agrees to not increase her dose unless she discusses this with [place] first. Laura [name] agrees to follow up in 2 weeks.  At risk for osteopenia and osteoporosis Laura [name] was given extended (15 minutes) osteoporosis prevention counseling today. Laura [name] is at risk for osteopenia and osteoporosis due to her vitamin D deficiency. She was encouraged to take her vitamin D and follow her higher calcium diet and increase strengthening exercise to help strengthen her bones and decrease her risk of osteopenia and osteoporosis.  Pre-Diabetes Laura [name] will continue to work on weight loss, exercise, and decreasing simple carbohydrates in her diet to help decrease the risk of diabetes. She was informed that eating too many simple carbohydrates or too many calories at one sitting increases the likelihood of GI side effects. We will discuss pre-diabetes in detail. Laura [name] agreed to follow up with [place] as directed to monitor her progress.  Hypertension We discussed sodium restriction, working on healthy weight loss, and a regular exercise program as the means to  achieve improved blood pressure control. We will continue to monitor her blood pressure as well as her progress with the above lifestyle modifications. She will continue her medications as prescribed and will watch for signs of hypotension as she continues her lifestyle modifications. She will follow up with her PCP and will always take her medications. Laura [name] agreed with this plan  and agreed to follow up as directed.  Obesity Laura [name] is currently in the action stage of change. As such, her goal is to continue with weight loss efforts, continue meal planning, and increasing her water intake. She has agreed to follow the Category 3 plan + 100 calories. Laura [name] has been instructed to work up to a goal of 150 minutes of combined cardio and strengthening exercise per week for weight loss and overall health benefits. We discussed the following Behavioral Modification Strategies today: increasing lean protein intake, decreasing simple carbohydrates, increasing vegetables, increase H2O intake, no skipping meals, keeping healthy foods in the home, and work on meal planning and easy cooking plans.  Laura [name] has agreed to follow up with our clinic in 2 weeks. She was informed of the importance of frequent follow up visits to maximize her success with intensive lifestyle modifications for her multiple health conditions.  ALLERGIES: No Known Allergies  MEDICATIONS: Current Outpatient Medications on File Prior to Visit  Medication Sig Dispense Refill  . albuterol (PROVENTIL HFA;VENTOLIN HFA) 108 (90 BASE) MCG/ACT inhaler Inhale 2 puffs into the lungs every 4 (four) hours as needed. 3 Inhaler 3  . azelastine (OPTIVAR) 0.05 % ophthalmic solution Place 1 drop into both eyes 2 (two) times daily. 6 mL 12  . calcium carbonate (TUMS - DOSED IN MG ELEMENTAL CALCIUM) 500 MG chewable tablet Chew 2 tablets by mouth daily. As needed daily for heartburn/indigestion     . clotrimazole (MYCELEX) 10 MG troche Take 1 tablet (10 mg total) by mouth 5 (five) times daily. 180 tablet 1  . ferrous sulfate 220 (44 Fe) MG/5ML solution Take 220 mg by mouth daily.    . fluticasone (FLONASE) 50 MCG/ACT nasal spray USE 2 SPRAYS IN BOTH NOSTRILS DAILY 16 g 2  . furosemide (LASIX) 40 MG tablet TAKE 1 TABLET BY MOUTH EVERY DAY 90 tablet 1  . ibuprofen (ADVIL,MOTRIN) 800 MG tablet Take 1 tablet (800 mg total) by  mouth every 8 (eight) hours as needed. 30 tablet 0  . irbesartan (AVAPRO) 150 MG tablet TAKE 1 TABLET DAILY 90 tablet 1  . KLOR-CON M10 10 MEQ tablet TAKE 1 TABLET BY MOUTH EVERY DAY 90 tablet 1  . labetalol (NORMODYNE) 300 MG tablet TAKE 1 TABLET TWICE A DAY 180 tablet 2  . losartan (COZAAR) 50 MG tablet Please specify directions, refills and quantity 90 tablet 3  . naproxen (NAPROSYN) 500 MG tablet Take 1 tablet (500 mg total) by mouth 2 (two) times daily with a meal. 60 tablet 2  . pantoprazole (PROTONIX) 40 MG tablet TAKE 1 TABLET(40 MG) BY MOUTH DAILY 90 tablet 3  . Prenatal Vit-Fe Fumarate-FA (PRENATAL PLUS/IRON) 27-1 MG TABS Take by mouth daily.       No current facility-administered medications on file prior to visit.     PAST MEDICAL HISTORY: Past Medical History:  Diagnosis Date  . ALLERGIC RHINITIS 06/01/2008  . Anemia   . Anxiety 08/15/2010  . ASTHMA 06/01/2008  . BURSITIS, LEFT HIP 04/28/2010  . GOITER, MULTINODULAR 06/28/2008  . Hirsutism 06/02/2009  . HYPERTENSION 06/01/2008  . Palpitations 07/25/2010  . PERIPHERAL EDEMA  04/28/2010  . Pharyngitis, acute 10/09/2010  . TONSILLITIS, RECURRENT 05/05/2009    PAST SURGICAL HISTORY: Past Surgical History:  Procedure Laterality Date  . bilateral turbiante reductions    . CESAREAN SECTION  2007  . s/p sinus surgury  2006    SOCIAL HISTORY: Social History   Tobacco Use  . Smoking status: Never Smoker  . Smokeless tobacco: Never Used  Substance Use Topics  . Alcohol use: No  . Drug use: No    FAMILY HISTORY: Family History  Problem Relation Age of Onset  . Hypertension Mother   . Hyperlipidemia Mother   . Hypertension Maternal Grandmother   . Hyperlipidemia Maternal Grandmother   . Hypertension Maternal Grandfather   . Hyperlipidemia Maternal Grandfather   . Lung cancer Maternal Grandfather     ROS: Review of Systems  Constitutional: Positive for weight loss.    PHYSICAL EXAM: Blood pressure (!) 154/95, pulse 93,  temperature 98.1 F (36.7 C), temperature source Oral, height 5' (1.524 m), weight 213 lb (96.6 kg), SpO2 99 %. Body mass index is 41.6 kg/m. Physical Exam Vitals signs reviewed.  Constitutional:      Appearance: Normal appearance. She is obese.  Cardiovascular:     Rate and Rhythm: Normal rate.  Pulmonary:     Effort: Pulmonary effort is normal.  Musculoskeletal: Normal range of motion.  Skin:    General: Skin is warm and dry.  Neurological:     Mental Status: She is alert and oriented to person, place, and time.  Psychiatric:        Mood and Affect: Mood normal.        Behavior: Behavior normal.     RECENT LABS AND TESTS: BMET    Component Value Date/Time   NA 138 06/25/2018 1247   K 4.1 06/25/2018 1247   CL 101 06/25/2018 1247   CO2 22 06/25/2018 1247   GLUCOSE 84 06/25/2018 1247   GLUCOSE 85 10/11/2017 1516   BUN 10 06/25/2018 1247   CREATININE 0.83 06/25/2018 1247   CALCIUM 9.3 06/25/2018 1247   GFRNONAA 87 06/25/2018 1247   GFRAA 100 06/25/2018 1247   Lab Results  Component Value Date   HGBA1C 5.7 (H) 06/25/2018   Lab Results  Component Value Date   INSULIN 17.2 06/25/2018   CBC    Component Value Date/Time   WBC 5.9 10/11/2017 1516   RBC 4.28 10/11/2017 1516   HGB 10.0 (L) 10/11/2017 1516   HCT 31.6 (L) 10/11/2017 1516   PLT 314.0 10/11/2017 1516   MCV 73.8 (L) 10/11/2017 1516   MCHC 31.5 10/11/2017 1516   RDW 17.5 (H) 10/11/2017 1516   LYMPHSABS 2.0 10/11/2017 1516   MONOABS 0.7 10/11/2017 1516   EOSABS 0.3 10/11/2017 1516   BASOSABS 0.0 10/11/2017 1516   Iron/TIBC/Ferritin/ %Sat    Component Value Date/Time   IRON 153 (H) 05/17/2016 1555   IRONPCTSAT 45.5 05/17/2016 1555   Lipid Panel     Component Value Date/Time   CHOL 174 10/11/2017 1516   TRIG 98.0 10/11/2017 1516   HDL 48.30 10/11/2017 1516   CHOLHDL 4 10/11/2017 1516   VLDL 19.6 10/11/2017 1516   LDLCALC 106 (H) 10/11/2017 1516   Hepatic Function Panel     Component Value  Date/Time   PROT 6.8 06/25/2018 1247   ALBUMIN 4.4 06/25/2018 1247   AST 11 06/25/2018 1247   ALT 15 06/25/2018 1247   ALKPHOS 51 06/25/2018 1247   BILITOT 0.4 06/25/2018 1247  BILIDIR 0.1 10/11/2017 1516      Component Value Date/Time   TSH 1.13 10/11/2017 1516   TSH 0.64 05/17/2016 1555   TSH 1.32 06/25/2014 1428   Results for Borin, SHAUN ZUCCARO (MRN [id number]) as of 07/10/2018 08:52  Ref. Range 06/25/2018 12:47  Vitamin D, 25-Hydroxy Latest Ref Range: 30.0 - 100.0 ng/mL 15.6 (L)    OBESITY BEHAVIORAL INTERVENTION VISIT  Today's visit was # 2   Starting weight: 219 lbs Starting date: 06/25/18 Today's weight : Weight: 213 lb (96.6 kg)  Today's date: 07/09/2018 Total lbs lost to date: 6  ASK: We discussed the diagnosis of obesity with Yvette Rack today and Hazle agreed to give [place] permission to discuss obesity behavioral modification therapy today.  ASSESS: Samaa has the diagnosis of obesity and her BMI today is 41.6. Nekayla is in the action stage of change.   ADVISE: Jazlene was educated on the multiple health risks of obesity as well as the benefit of weight loss to improve her health. She was advised of the need for long term treatment and the importance of lifestyle modifications to improve her current health and to decrease her risk of future health problems.  AGREE: Multiple dietary modification options and treatment options were discussed and Josaphine agreed to follow the recommendations documented in the above note.  ARRANGE: Berkleigh was educated on the importance of frequent visits to treat obesity as outlined per CMS and USPSTF guidelines and agreed to schedule her next follow up appointment today.  I, Marcille Blanco, CMA, am acting as Location manager for [employer]. Owens Shark, DO  I have reviewed the above documentation for accuracy and completeness, and I agree with the above. -Jearld Lesch, DO

## 2018-07-16 NOTE — Progress Notes (Signed)
SPOKE WITH SHENELLE PINDER TEDS ARE KNEE HI TEDS ORDERED

## 2018-07-16 NOTE — Patient Instructions (Addendum)
Laura [name]       Your procedure is scheduled on 07-22-2018  Report to [facility]  at  [place]  Call this number if you have problems the morning of surgery:[phone]  OUR ADDRESS IS [address], WE ARE LOCATED IN THE [facility] WITH [facility].   Remember:  Do not eat food or drink liquids after midnight.  Take these medicines the morning of surgery with A SIP OF WATER : ALBUTEROL INHALER IF NEEDED AND BRING INHALER, LABETALOL, PANTAPRAZOLE (PROTONIX)   Do not wear jewelry, make-up or nail polish.  Do not wear lotions, powders, or perfumes, or deoderant.  Do not shave 48 hours prior to surgery.  Men may shave face and neck.  Do not bring valuables to the hospital.  [facility] is not responsible for any belongings or valuables.  Contacts, dentures or bridgework may not be worn into surgery.  Leave your suitcase in the car.  After surgery it may be brought to your room.  For patients admitted to the hospital, discharge time will be determined by your treatment team.  Patients discharged the day of surgery will not be allowed to drive home.       [place] - Preparing for Surgery Before surgery, you can play an important role.  Because skin is not sterile, your skin needs to be as free of germs as possible.  You can reduce the number of germs on your skin by washing with CHG (chlorahexidine gluconate) soap before surgery.  CHG is an antiseptic cleaner which kills germs and bonds with the skin to continue killing germs even after washing. Please DO NOT use if you have an allergy to CHG or antibacterial soaps.  If your skin becomes reddened/irritated stop using the CHG and inform your nurse when you arrive at Short Stay. Do not shave (including legs and underarms) for at least 48 hours prior to the first CHG shower.  You may shave your face/neck. Please follow these instructions carefully:  1.  Shower with CHG Soap the night before  surgery and the  morning of Surgery.  2.  If you choose to wash your hair, wash your hair first as usual with your  normal  shampoo.  3.  After you shampoo, rinse your hair and body thoroughly to remove the  shampoo.                           4.  Use CHG as you would any other liquid soap.  You can apply chg directly  to the skin and wash                       Gently with a scrungie or clean washcloth.  5.  Apply the CHG Soap to your body ONLY FROM THE NECK DOWN.   Do not use on face/ open                           Wound or open sores. Avoid contact with eyes, ears mouth and genitals (private parts).                       Wash face,  Genitals (private parts) with your normal soap.             6.  Wash thoroughly, paying special attention to  the area where your surgery  will be performed.  7.  Thoroughly rinse your body with warm water from the neck down.  8.  DO NOT shower/wash with your normal soap after using and rinsing off  the CHG Soap.                9.  Pat yourself dry with a clean towel.            10.  Wear clean pajamas.            11.  Place clean sheets on your bed the night of your first shower and do not  sleep with pets. Day of Surgery : Do not apply any lotions/deodorants the morning of surgery.  Please wear clean clothes to the hospital/surgery center.  FAILURE TO FOLLOW THESE INSTRUCTIONS MAY RESULT IN THE CANCELLATION OF YOUR SURGERY PATIENT SIGNATURE_________________________________  NURSE SIGNATURE__________________________________  ________________________________________________________________________        Laura [name]  07/17/2018      Your procedure is scheduled on2/25/2020   Report to [facility]  Call this number if you have problems the morning of surgery:[phone]  OUR ADDRESS IS [place], WE ARE LOCATED IN THE [facility] WITH [facility].   Remember:  Do not eat food or drink liquids  after midnight.  Take these medicines the morning of surgery with A SIP OF WATER   Do not wear jewelry, make-up or nail polish.  Do not wear lotions, powders, or perfumes, or deoderant.  Do not shave 48 hours prior to surgery.  Men may shave face and neck.  Do not bring valuables to the hospital.  [facility] is not responsible for any belongings or valuables.  Contacts, dentures or bridgework may not be worn into surgery.  Leave your suitcase in the car.  After surgery it may be brought to your room.  For patients admitted to the hospital, discharge time will be determined by your treatment team.  Patients discharged the day of surgery will not be allowed to drive home.     Please read over the following fact sheets that you were given:       [facility] - Preparing for Surgery Before surgery, you can play an important role.  Because skin is not sterile, your skin needs to be as free of germs as possible.  You can reduce the number of germs on your skin by washing with CHG (chlorahexidine gluconate) soap before surgery.  CHG is an antiseptic cleaner which kills germs and bonds with the skin to continue killing germs even after washing. Please DO NOT use if you have an allergy to CHG or antibacterial soaps.  If your skin becomes reddened/irritated stop using the CHG and inform your nurse when you arrive at Short Stay. Do not shave (including legs and underarms) for at least 48 hours prior to the first CHG shower.  You may shave your face/neck. Please follow these instructions carefully:  1.  Shower with CHG Soap the night before surgery and the  morning of Surgery.  2.  If you choose to wash your hair, wash your hair first as usual with your  normal  shampoo.  3.  After you shampoo, rinse your hair and body thoroughly to remove the  shampoo.  4.  Use CHG as you would any other liquid soap.  You can apply chg directly  to the skin and wash                        Gently with a scrungie or clean washcloth.  5.  Apply the CHG Soap to your body ONLY FROM THE NECK DOWN.   Do not use on face/ open                           Wound or open sores. Avoid contact with eyes, ears mouth and genitals (private parts).                       Wash face,  Genitals (private parts) with your normal soap.             6.  Wash thoroughly, paying special attention to the area where your surgery  will be performed.  7.  Thoroughly rinse your body with warm water from the neck down.  8.  DO NOT shower/wash with your normal soap after using and rinsing off  the CHG Soap.                9.  Pat yourself dry with a clean towel.            10.  Wear clean pajamas.            11.  Place clean sheets on your bed the night of your first shower and do not  sleep with pets. Day of Surgery : Do not apply any lotions/deodorants the morning of surgery.  Please wear clean clothes to the hospital/surgery center.  FAILURE TO FOLLOW THESE INSTRUCTIONS MAY RESULT IN THE CANCELLATION OF YOUR SURGERY PATIENT SIGNATURE_________________________________  NURSE SIGNATURE__________________________________  ________________________________________________________________________

## 2018-07-16 NOTE — Progress Notes (Signed)
EKG 06-25-2018 EPIC

## 2018-07-17 ENCOUNTER — Encounter (HOSPITAL_COMMUNITY): Payer: Self-pay

## 2018-07-17 ENCOUNTER — Other Ambulatory Visit: Payer: Self-pay

## 2018-07-17 ENCOUNTER — Encounter (HOSPITAL_COMMUNITY)
Admission: RE | Admit: 2018-07-17 | Discharge: 2018-07-17 | Disposition: A | Discharge status: Home / Self Care | Payer: 59 | Source: Ambulatory Visit | Attending: Obstetrics and Gynecology | Admitting: Obstetrics and Gynecology

## 2018-07-17 DIAGNOSIS — Z01812 Encounter for preprocedural laboratory examination: Secondary | ICD-10-CM | POA: Insufficient documentation

## 2018-07-17 HISTORY — DX: Gastro-esophageal reflux disease without esophagitis: K21.9

## 2018-07-17 HISTORY — DX: Unspecified asthma, uncomplicated: J45.909

## 2018-07-17 LAB — CBC
HCT: 44.4 % (ref 36.0–46.0)
Hemoglobin: 14.7 g/dL (ref 12.0–15.0)
MCH: 32.1 pg (ref 26.0–34.0)
MCHC: 33.1 g/dL (ref 30.0–36.0)
MCV: 96.9 fL (ref 80.0–100.0)
Platelets: 198 10*3/uL (ref 150–400)
RBC: 4.58 MIL/uL (ref 3.87–5.11)
RDW: 12.1 % (ref 11.5–15.5)
WBC: 5.6 10*3/uL (ref 4.0–10.5)
nRBC: 0 % (ref 0.0–0.2)

## 2018-07-17 LAB — BASIC METABOLIC PANEL
Anion gap: 9 (ref 5–15)
BUN: 17 mg/dL (ref 6–20)
CO2: 23 mmol/L (ref 22–32)
Calcium: 9.5 mg/dL (ref 8.9–10.3)
Chloride: 104 mmol/L (ref 98–111)
Creatinine, Ser: 0.74 mg/dL (ref 0.44–1.00)
GFR calc Af Amer: >60 mL/min (ref >60)
GFR calc non Af Amer: >60 mL/min (ref >60)
Glucose, Bld: 88 mg/dL (ref 70–99)
Potassium: 4 mmol/L (ref 3.5–5.1)
Sodium: 136 mmol/L (ref 135–145)

## 2018-07-22 ENCOUNTER — Encounter (HOSPITAL_BASED_OUTPATIENT_CLINIC_OR_DEPARTMENT_OTHER)
Admission: RE | Disposition: A | Discharge status: Home / Self Care | Payer: Self-pay | Source: Home / Self Care | Attending: Obstetrics and Gynecology

## 2018-07-22 ENCOUNTER — Ambulatory Visit (HOSPITAL_BASED_OUTPATIENT_CLINIC_OR_DEPARTMENT_OTHER): Payer: 59 | Admitting: Anesthesiology

## 2018-07-22 ENCOUNTER — Ambulatory Visit (HOSPITAL_BASED_OUTPATIENT_CLINIC_OR_DEPARTMENT_OTHER)
Admission: RE | Admit: 2018-07-22 | Discharge: 2018-07-22 | Disposition: A | Discharge status: Home / Self Care | Payer: 59 | Attending: Obstetrics and Gynecology | Admitting: Obstetrics and Gynecology

## 2018-07-22 ENCOUNTER — Ambulatory Visit (HOSPITAL_BASED_OUTPATIENT_CLINIC_OR_DEPARTMENT_OTHER): Payer: 59 | Admitting: Physician Assistant

## 2018-07-22 ENCOUNTER — Encounter (HOSPITAL_BASED_OUTPATIENT_CLINIC_OR_DEPARTMENT_OTHER): Payer: Self-pay | Admitting: Anesthesiology

## 2018-07-22 DIAGNOSIS — Z79899 Other long term (current) drug therapy: Secondary | ICD-10-CM | POA: Diagnosis not present

## 2018-07-22 DIAGNOSIS — I10 Essential (primary) hypertension: Secondary | ICD-10-CM | POA: Insufficient documentation

## 2018-07-22 DIAGNOSIS — N920 Excessive and frequent menstruation with regular cycle: Secondary | ICD-10-CM | POA: Diagnosis present

## 2018-07-22 DIAGNOSIS — J45909 Unspecified asthma, uncomplicated: Secondary | ICD-10-CM | POA: Insufficient documentation

## 2018-07-22 DIAGNOSIS — N840 Polyp of corpus uteri: Secondary | ICD-10-CM | POA: Diagnosis not present

## 2018-07-22 DIAGNOSIS — D250 Submucous leiomyoma of uterus: Secondary | ICD-10-CM | POA: Diagnosis not present

## 2018-07-22 DIAGNOSIS — Z6841 Body Mass Index (BMI) 40.0 and over, adult: Secondary | ICD-10-CM | POA: Insufficient documentation

## 2018-07-22 DIAGNOSIS — D649 Anemia, unspecified: Secondary | ICD-10-CM | POA: Insufficient documentation

## 2018-07-22 HISTORY — PX: DILATATION & CURETTAGE/HYSTEROSCOPY WITH MYOSURE: SHX6511

## 2018-07-22 LAB — POCT PREGNANCY, URINE: Preg Test, Ur: NEGATIVE

## 2018-07-22 SURGERY — DILATATION & CURETTAGE/HYSTEROSCOPY WITH MYOSURE
Anesthesia: Monitor Anesthesia Care | Site: Uterus

## 2018-07-22 MED ORDER — BUPIVACAINE IN DEXTROSE 0.75-8.25 % IT SOLN
INTRATHECAL | Status: DC | PRN
  Administered 2018-07-22: 1.5 mL via INTRATHECAL

## 2018-07-22 MED ORDER — CELECOXIB 200 MG PO CAPS
ORAL_CAPSULE | ORAL | Status: AC
  Filled 2018-07-22: qty 2

## 2018-07-22 MED ORDER — FENTANYL CITRATE (PF) 100 MCG/2ML IJ SOLN
INTRAMUSCULAR | Status: DC | PRN
  Administered 2018-07-22: 50 ug via INTRAVENOUS

## 2018-07-22 MED ORDER — FENTANYL CITRATE (PF) 100 MCG/2ML IJ SOLN
25.0000 ug | INTRAMUSCULAR | Status: DC | PRN
  Filled 2018-07-22: qty 1

## 2018-07-22 MED ORDER — MEPERIDINE HCL 25 MG/ML IJ SOLN
6.2500 mg | INTRAMUSCULAR | Status: DC | PRN
  Filled 2018-07-22: qty 1

## 2018-07-22 MED ORDER — FENTANYL CITRATE (PF) 100 MCG/2ML IJ SOLN
INTRAMUSCULAR | Status: AC
  Filled 2018-07-22: qty 2

## 2018-07-22 MED ORDER — PROPOFOL 10 MG/ML IV BOLUS
INTRAVENOUS | Status: AC
  Filled 2018-07-22: qty 20

## 2018-07-22 MED ORDER — PROPOFOL 500 MG/50ML IV EMUL
INTRAVENOUS | Status: DC | PRN
  Administered 2018-07-22: 200 ug/kg/min via INTRAVENOUS

## 2018-07-22 MED ORDER — GABAPENTIN 300 MG PO CAPS
300.0000 mg | ORAL_CAPSULE | ORAL | Status: AC
  Administered 2018-07-22: 300 mg via ORAL
  Filled 2018-07-22: qty 1

## 2018-07-22 MED ORDER — DEXAMETHASONE SODIUM PHOSPHATE 4 MG/ML IJ SOLN
4.0000 mg | INTRAMUSCULAR | Status: AC
  Administered 2018-07-22: 8 mg via INTRAVENOUS
  Filled 2018-07-22: qty 1

## 2018-07-22 MED ORDER — HYDROCODONE-ACETAMINOPHEN 5-325 MG PO TABS: 1.0000 | ORAL_TABLET | Freq: Four times a day (QID) | ORAL | 0 refills | Status: AC | PRN

## 2018-07-22 MED ORDER — MIDAZOLAM HCL 2 MG/2ML IJ SOLN
INTRAMUSCULAR | Status: AC
  Filled 2018-07-22: qty 2

## 2018-07-22 MED ORDER — PROPOFOL 500 MG/50ML IV EMUL
INTRAVENOUS | Status: AC
  Filled 2018-07-22: qty 50

## 2018-07-22 MED ORDER — ONDANSETRON HCL 4 MG/2ML IJ SOLN
INTRAMUSCULAR | Status: DC | PRN
  Administered 2018-07-22: 4 mg via INTRAVENOUS

## 2018-07-22 MED ORDER — DEXAMETHASONE SODIUM PHOSPHATE 10 MG/ML IJ SOLN
INTRAMUSCULAR | Status: AC
  Filled 2018-07-22: qty 1

## 2018-07-22 MED ORDER — OXYCODONE HCL 5 MG/5ML PO SOLN
5.0000 mg | Freq: Once | ORAL | Status: DC | PRN
  Filled 2018-07-22: qty 5

## 2018-07-22 MED ORDER — ACETAMINOPHEN 500 MG PO TABS
ORAL_TABLET | ORAL | Status: AC
  Filled 2018-07-22: qty 2

## 2018-07-22 MED ORDER — MIDAZOLAM HCL 5 MG/5ML IJ SOLN
INTRAMUSCULAR | Status: DC | PRN
  Administered 2018-07-22: 2 mg via INTRAVENOUS

## 2018-07-22 MED ORDER — SODIUM CHLORIDE 0.9 % IR SOLN
Status: DC | PRN
  Administered 2018-07-22: 3000 mL

## 2018-07-22 MED ORDER — METOCLOPRAMIDE HCL 5 MG/ML IJ SOLN
10.0000 mg | Freq: Once | INTRAMUSCULAR | Status: DC | PRN
  Filled 2018-07-22: qty 2

## 2018-07-22 MED ORDER — ACETAMINOPHEN 500 MG PO TABS
1000.0000 mg | ORAL_TABLET | ORAL | Status: AC
  Administered 2018-07-22: 1000 mg via ORAL
  Filled 2018-07-22: qty 2

## 2018-07-22 MED ORDER — LACTATED RINGERS IV SOLN
INTRAVENOUS | Status: DC
  Administered 2018-07-22 (×3): via INTRAVENOUS
  Filled 2018-07-22: qty 1000

## 2018-07-22 MED ORDER — OXYCODONE HCL 5 MG PO TABS
5.0000 mg | ORAL_TABLET | Freq: Once | ORAL | Status: DC | PRN
  Filled 2018-07-22: qty 1

## 2018-07-22 MED ORDER — ONDANSETRON HCL 4 MG/2ML IJ SOLN
INTRAMUSCULAR | Status: AC
  Filled 2018-07-22: qty 2

## 2018-07-22 MED ORDER — LIDOCAINE HCL (CARDIAC) PF 100 MG/5ML IV SOSY
PREFILLED_SYRINGE | INTRAVENOUS | Status: DC | PRN
  Administered 2018-07-22: 50 mg via INTRAVENOUS

## 2018-07-22 MED ORDER — GABAPENTIN 300 MG PO CAPS
ORAL_CAPSULE | ORAL | Status: AC
  Filled 2018-07-22: qty 1

## 2018-07-22 MED ORDER — CELECOXIB 400 MG PO CAPS
400.0000 mg | ORAL_CAPSULE | ORAL | Status: AC
  Administered 2018-07-22: 400 mg via ORAL
  Filled 2018-07-22: qty 1

## 2018-07-22 MED ORDER — ARTIFICIAL TEARS OPHTHALMIC OINT
TOPICAL_OINTMENT | OPHTHALMIC | Status: AC
  Filled 2018-07-22: qty 3.5

## 2018-07-22 MED ORDER — LIDOCAINE 2% (20 MG/ML) 5 ML SYRINGE
INTRAMUSCULAR | Status: AC
  Filled 2018-07-22: qty 5

## 2018-07-22 SURGICAL SUPPLY — 27 items
BIPOLAR CUTTING LOOP 21FR (ELECTRODE)
CANISTER SUCT 3000ML PPV (MISCELLANEOUS) ×1 IMPLANT
CATH ROBINSON RED A/P 16FR (CATHETERS) IMPLANT
COVER BACK TABLE 60X90IN (DRAPES) ×1 IMPLANT
COVER WAND RF STERILE (DRAPES) ×4 IMPLANT
DEVICE MYOSURE LITE (MISCELLANEOUS) IMPLANT
DEVICE MYOSURE REACH (MISCELLANEOUS) IMPLANT
DILATOR CANAL MILEX (MISCELLANEOUS) IMPLANT
DRAPE SHEET LG 3/4 BI-LAMINATE (DRAPES) ×1 IMPLANT
GAUZE 4X4 16PLY RFD (DISPOSABLE) ×3 IMPLANT
GLOVE BIOGEL PI IND STRL 7.0 (GLOVE) ×1 IMPLANT
GLOVE BIOGEL PI INDICATOR 7.0 (GLOVE) ×2
GLOVE ECLIPSE 6.5 STRL STRAW (GLOVE) ×3 IMPLANT
GOWN STRL REUS W/TWL LRG LVL3 (GOWN DISPOSABLE) ×5 IMPLANT
IV NS IRRIG 3000ML ARTHROMATIC (IV SOLUTION) ×3 IMPLANT
KIT PROCEDURE FLUENT (KITS) ×3 IMPLANT
KIT TURNOVER CYSTO (KITS) ×3 IMPLANT
LOOP CUTTING BIPOLAR 21FR (ELECTRODE) IMPLANT
MYOSURE XL FIBROID (MISCELLANEOUS) ×3
PACK VAGINAL MINOR WOMEN LF (CUSTOM PROCEDURE TRAY) ×3 IMPLANT
PAD OB MATERNITY 4.3X12.25 (PERSONAL CARE ITEMS) ×3 IMPLANT
PAD PREP 24X48 CUFFED NSTRL (MISCELLANEOUS) ×3 IMPLANT
SEAL ROD LENS SCOPE MYOSURE (ABLATOR) ×3 IMPLANT
SYSTEM TISS REMOVAL MYOSURE XL (MISCELLANEOUS) IMPLANT
TOWEL OR 17X26 10 PK STRL BLUE (TOWEL DISPOSABLE) ×4 IMPLANT
TRAY FOLEY W/BAG SLVR 14FR (SET/KITS/TRAYS/PACK) ×2 IMPLANT
WATER STERILE IRR 500ML POUR (IV SOLUTION) ×1 IMPLANT

## 2018-07-22 NOTE — H&P (Signed)
Laura [name] is an 43 y.o. female.G2P1011 BF here for surgical management of SM fibroid causing menorrhagia  Pertinent Gynecological History: Menses: heavy flow Bleeding: menorrhagia Contraception: none DES exposure: denies Blood transfusions: none Sexually transmitted diseases: no past history Previous GYN Procedures: DNC  Last mammogram: normal Date: 04/15/2018 Last pap: normal Date: 04/15/2018 OB History: G2P1011   Menstrual History: Menarche age: n/Laura Patient's last menstrual period was 07/07/2018.    Past Medical History:  Diagnosis Date  . ALLERGIC RHINITIS 06/01/2008  . Anemia   . Anxiety 08/15/2010  . ASTHMA 06/01/2008  . Asthma   . BURSITIS, LEFT HIP 04/28/2010  . GERD (gastroesophageal reflux disease)   . GOITER, MULTINODULAR 06/28/2008  . Hirsutism 06/02/2009  . HYPERTENSION 06/01/2008  . Palpitations 07/25/2010  . PERIPHERAL EDEMA 04/28/2010  . Pharyngitis, acute 10/09/2010  . TONSILLITIS, RECURRENT 05/05/2009    Past Surgical History:  Procedure Laterality Date  . bilateral turbiante reductions    . CESAREAN SECTION  2007  . s/p sinus surgury  2006    Family History  Problem Relation Age of Onset  . Hypertension Mother   . Hyperlipidemia Mother   . Hypertension Maternal Grandmother   . Hyperlipidemia Maternal Grandmother   . Hypertension Maternal Grandfather   . Hyperlipidemia Maternal Grandfather   . Lung cancer Maternal Grandfather     Social History:  reports that she has never smoked. She has never used smokeless tobacco. She reports that she does not drink alcohol or use drugs.  Allergies: No Known Allergies  Medications Prior to Admission  Medication Sig Dispense Refill Last Dose  . clotrimazole (MYCELEX) 10 MG troche Take 1 tablet (10 mg total) by mouth 5 (five) times daily. (Patient taking differently: Take 10 mg by mouth 5 (five) times daily as needed (sore throat). ) 180 tablet 1 Past Month at Unknown time  . Ferrous Sulfate (FEOSOL PO)  Take 1 tablet by mouth daily.   07/21/2018 at Unknown time  . furosemide (LASIX) 40 MG tablet TAKE 1 TABLET BY MOUTH EVERY DAY (Patient taking differently: Take 40 mg by mouth daily as needed for edema. ) 90 tablet 1 Past Month at Unknown time  . KLOR-CON M10 10 MEQ tablet TAKE 1 TABLET BY MOUTH EVERY DAY (Patient taking differently: Take 10 mEq by mouth daily as needed (swelling). Take with lasix) 90 tablet 1 Past Month at Unknown time  . labetalol (NORMODYNE) 200 MG tablet Take 2 tablets (400 mg total) by mouth 2 (two) times daily. 360 tablet 3 07/22/2018 at 0530  . losartan (COZAAR) 100 MG tablet Take 1 tablet (100 mg total) by mouth daily. 90 tablet 3 07/22/2018 at 0530  . Vitamin D, Ergocalciferol, (DRISDOL) 1.25 MG (50000 UT) CAPS capsule Take 1 capsule (50,000 Units total) by mouth every 7 (seven) days. (Patient taking differently: Take 50,000 Units by mouth every Wednesday. ) 4 capsule 0 07/16/2018  . acetaminophen (TYLENOL) 325 MG tablet Take 325 mg by mouth every 6 (six) hours as needed for moderate pain or headache.   More than Laura month at Unknown time  . albuterol (PROVENTIL HFA;VENTOLIN HFA) 108 (90 BASE) MCG/ACT inhaler Inhale 2 puffs into the lungs every 4 (four) hours as needed. (Patient taking differently: Inhale 2 puffs into the lungs every 4 (four) hours as needed for wheezing or shortness of breath. ) 3 Inhaler 3 More than Laura month at Unknown time  . azelastine (OPTIVAR) 0.05 % ophthalmic solution Place 1 drop into both eyes 2 (two)  times daily. (Patient taking differently: Place 1 drop into both eyes 2 (two) times daily as needed (dry eyes). ) 6 mL 12 More than Laura month at Unknown time  . fluticasone (FLONASE) 50 MCG/ACT nasal spray USE 2 SPRAYS IN BOTH NOSTRILS DAILY (Patient taking differently: Place 2 sprays into both nostrils daily as needed for allergies. ) 16 g 2 More than Laura month at Unknown time  . Liniments (SALONPAS PAIN RELIEF PATCH EX) Apply 1 patch topically daily as needed  (pain).   More than Laura month at Unknown time  . pantoprazole (PROTONIX) 40 MG tablet TAKE 1 TABLET(40 MG) BY MOUTH DAILY (Patient not taking: Reported on 07/16/2018) 90 tablet 3 Not Taking at Unknown time    Review of Systems  All other systems reviewed and are negative.   Blood pressure (!) 146/94, pulse 96, temperature 97.7 F (36.5 C), temperature source Oral, resp. rate 16, height 5' 0.5" (1.537 m), weight 94.8 kg, last menstrual period 07/07/2018, SpO2 99 %. Physical Exam  Constitutional: She is oriented to person, place, and time. She appears well-developed and well-nourished.  HENT:  Head: Atraumatic.  Neck: Neck supple.  Cardiovascular: Regular rhythm.  Respiratory: Effort normal.  GI: Soft.  Genitourinary:    Genitourinary Comments: Vulva nl  Vagina nl  cervix s/p LEEP Uterus sl enlarged  adnexa no palp mass   Musculoskeletal: Normal range of motion.  Neurological: She is alert and oriented to person, place, and time.  Skin: Skin is warm and dry.  Psychiatric: She has Laura normal mood and affect.    No results found for this or any previous visit (from the past 24 hour(s)).  No results found.  Assessment/Plan: Menorrhagia with regular cycles SM fibroid P) dx hysteroscopy, hysteroscopic resection of SM fibroid, D&C. Risk of surgery includes infection, bleeding, uterine perforation and its risk, fluid overload, thermal injury, poss need for blood transfusion and its risk, inability to resection due to fluid overload or size of fibroid. All ? naswered  Laura [name] Laura [name] 07/22/2018, 8:50 AM

## 2018-07-22 NOTE — Op Note (Signed)
NAME: Laura [name], Laura [name] MEDICAL RECORD [id number] ACCOUNT [id number] DATE OF BIRTH:Aug 30, 1975 FACILITY: [facility] LOCATION: [facility] PHYSICIAN:Dalbert Stillings A. Terresa Marlett, MD  OPERATIVE REPORT  DATE OF PROCEDURE:  07/22/2018  PREOPERATIVE DIAGNOSIS:  Menorrhagia with regular cycles, submucosal fibroid.  PROCEDURE PERFORMED:  Diagnostic hysteroscopy, hysteroscopic resection of submucosal fibroid, endometrial polyp, dilation and curettage.  POSTOPERATIVE DIAGNOSIS:  Menorrhagia with regular cycles, submucosal fibroid, endometrial polyp.  ANESTHESIA:  Spinal.  SURGEON:  Servando Salina, MD  ASSISTANT:  None.  DESCRIPTION OF PROCEDURE:  Under adequate spinal anesthesia, the patient was placed in the dorsal lithotomy position.  She was sterilely prepped and draped in the usual fashion.  Indwelling Foley catheter was sterilely placed.  A bivalve speculum was  placed in the vagina.  A single-tooth tenaculum was placed on the anterior lip of the cervix.  The cervix was then serially dilated up to a #23 Pratt dilator.  A diagnostic hysteroscope was inserted into the uterine cavity, and an anterior polypoid lesion was noted.  A small submucosal fibroid was noted in the left lateral wall, and both tubal ostia could be seen.  Using the MyoSure XL, the polyp and the fibroid were resected, as was the endometrial cavity.  When all lesions were felt to have been  removed, the endocervical canal was inspected.  No lesions were noted, and the instruments were then removed.  SPECIMEN:  Labeled endometrial curetting with fibroid and possible polyp were sent to pathology.  FLUID DEFICIT:  400 mL  ESTIMATED BLOOD LOSS:  10 mL.  COMPLICATIONS:  None.  DISPOSITION:  The patient tolerated the procedure well and was transferred to recovery room in stable condition.  LN/NUANCE  D:07/22/2018 T:07/22/2018 JOB:[id number]

## 2018-07-22 NOTE — Anesthesia Procedure Notes (Signed)
Spinal  Patient location during procedure: OR Start time: 07/22/2018 9:09 AM End time: 07/22/2018 9:14 AM Staffing Anesthesiologist: Josephine Igo, MD Performed: anesthesiologist  Preanesthetic Checklist Completed: patient identified, site marked, surgical consent, pre-op evaluation, timeout performed, IV checked, risks and benefits discussed and monitors and equipment checked Spinal Block Patient position: sitting Prep: ChloraPrep and site prepped and draped Patient monitoring: heart rate, cardiac monitor, continuous pulse ox and blood pressure Approach: midline Location: L5-S1 Injection technique: single-shot Needle Needle type: Sprotte  Needle gauge: 24 G Needle length: 9 cm Needle insertion depth: 6 cm Assessment Sensory level: T6 Additional Notes Patient tolerated procedure well. Adequate sensory level.

## 2018-07-22 NOTE — Discharge Instructions (Signed)
CALL  IF TEMP>100.4, NOTHING PER VAGINA X 2 WK, CALL IF SOAKING A MAXI  PAD EVERY HOUR OR MORE FREQUENTLY ° ° ° °D & C Home care Instructions: ° ° °Personal hygiene:  Used sanitary napkins for vaginal drainage not tampons. Shower or tub bathe the day after your procedure. No douching until bleeding stops. Always wipe from front to back after  Elimination. ° °Activity: Do not drive or operate any equipment today. The effects of the anesthesia are still present and drowsiness may result. Rest today, not necessarily flat bed rest, just take it easy. You may resume your normal activity in one to 2 days. ° °Sexual activity: No intercourse for one week or as indicated by your physician ° °Diet: Eat a light diet as desired this evening. You may resume a regular diet tomorrow. ° °Return to work: One to 2 days. ° °General Expectations of your surgery: Vaginal bleeding should be no heavier than a normal period. Spotting may continue up to 10 days. Mild cramps may continue for a couple of days. You may have a regular period in 2-6 weeks. ° °Unexpected observations call your doctor if these occur: persistent or heavy bleeding. Severe abdominal cramping or pain. Elevation of temperature greater than 100°F. ° °Call for an appointment in one week. ° ° ° °Patient's Signature_______________________________________________________ ° °Nurse's Signature________________________________________________________ °Post Anesthesia Home Care Instructions ° °Activity: °Get plenty of rest for the remainder of the day. A responsible adult should stay with you for 24 hours following the procedure.  °For the next 24 hours, DO NOT: °-Drive a car °-Operate machinery °-Drink alcoholic beverages °-Take any medication unless instructed by your physician °-Make any legal decisions or sign important papers. ° °Meals: °Start with liquid foods such as gelatin or soup. Progress to regular foods as tolerated. Avoid greasy, spicy, heavy foods. If nausea  and/or vomiting occur, drink only clear liquids until the nausea and/or vomiting subsides. Call your physician if vomiting continues. ° °Special Instructions/Symptoms: °Your throat may feel dry or sore from the anesthesia or the breathing tube placed in your throat during surgery. If this causes discomfort, gargle with warm salt water. The discomfort should disappear within 24 hours. ° °If you had a scopolamine patch placed behind your ear for the management of post- operative nausea and/or vomiting: ° °1. The medication in the patch is effective for 72 hours, after which it should be removed.  Wrap patch in a tissue and discard in the trash. Wash hands thoroughly with soap and water. °2. You may remove the patch earlier than 72 hours if you experience unpleasant side effects which may include dry mouth, dizziness or visual disturbances. °3. Avoid touching the patch. Wash your hands with soap and water after contact with the patch. °  ° °

## 2018-07-22 NOTE — Anesthesia Procedure Notes (Deleted)
Spinal

## 2018-07-22 NOTE — Transfer of Care (Signed)
Last Vitals:  Vitals Value Taken Time  BP 119/77 07/22/2018  9:56 AM  Temp 36.4 C 07/22/2018  9:56 AM  Pulse 80 07/22/2018 10:00 AM  Resp 0 07/22/2018 10:00 AM  SpO2 100 % 07/22/2018 10:00 AM  Vitals shown include unvalidated device data.  Last Pain:  Vitals:   07/22/18 0956  TempSrc:   PainSc: 0-No pain      Patients Stated Pain Goal: 4 (07/22/18 0809)  Immediate Anesthesia Transfer of Care Note  Patient: Laura [name]  Procedure(s) Performed: Procedure(s) (LRB): DILATATION & CURETTAGE/HYSTEROSCOPY WITH MYOSURE XL (N/A)  Patient Location: PACU  Anesthesia Type: General  Level of Consciousness: awake, alert  and oriented  Airway & Oxygen Therapy: Patient Spontanous Breathing and Patient connected to face mask oxygen  Post-op Assessment: Report given to PACU RN and Post -op Vital signs reviewed and stable  Post vital signs: Reviewed and stable  Complications: No apparent anesthesia complications

## 2018-07-22 NOTE — Anesthesia Postprocedure Evaluation (Signed)
Anesthesia Post Note  Patient: Laura [name]  Procedure(s) Performed: DILATATION & CURETTAGE/HYSTEROSCOPY WITH MYOSURE XL (N/A Uterus)     Patient location during evaluation: PACU Anesthesia Type: MAC Level of consciousness: oriented and awake and alert Pain management: pain level controlled Vital Signs Assessment: post-procedure vital signs reviewed and stable Respiratory status: spontaneous breathing, respiratory function stable and patient connected to nasal cannula oxygen Cardiovascular status: blood pressure returned to baseline and stable Postop Assessment: no headache, no backache and no apparent nausea or vomiting Anesthetic complications: no    Last Vitals:  Vitals:   07/22/18 1215 07/22/18 1230  BP: (!) 152/87 (!) 142/85  Pulse: 75 74  Resp: 15 17  Temp:    SpO2: 99% 98%    Last Pain:  Vitals:   07/22/18 1215  TempSrc:   PainSc: 0-No pain                 Yarianna Varble DAVID

## 2018-07-22 NOTE — Anesthesia Procedure Notes (Signed)
Procedure Name: MAC Date/Time: 07/22/2018 9:25 AM Performed by: Lillia Abed, MD Pre-anesthesia Checklist: Patient identified, Emergency Drugs available, Suction available and Patient being monitored Oxygen Delivery Method: Simple face mask Placement Confirmation: positive ETCO2 and breath sounds checked- equal and bilateral

## 2018-07-22 NOTE — Brief Op Note (Signed)
07/22/2018  9:58 AM  PATIENT:  Laura [name]  43 y.o. female  PRE-OPERATIVE DIAGNOSIS:  Submucosal Fibroid, menorrhagia  POST-OPERATIVE DIAGNOSIS:  Submucosal Fibroid, menorrhagia, endometrial polyp  PROCEDURE:  Diagnostic hysteroscopy, resection of SM fibroid, endometrial polyp, D &C  SURGEON:  Surgeon(s) and Role:    * Servando Salina, MD - Primary  PHYSICIAN ASSISTANT:   ASSISTANTS: none   ANESTHESIA:   spinal  EBL: 10 ml  BLOOD ADMINISTERED:none  DRAINS: none   LOCAL MEDICATIONS USED:  NONE  SPECIMEN:  Source of Specimen:  emc with fibroid ? polyp  DISPOSITION OF SPECIMEN:  PATHOLOGY  COUNTS:  YES  TOURNIQUET:  * No tourniquets in log *  DICTATION: .Other Dictation: Dictation Number [id number]  PLAN OF CARE: Discharge to home after PACU  PATIENT DISPOSITION:  PACU - hemodynamically stable.   Delay start of Pharmacological VTE agent (>24hrs) due to surgical blood loss or risk of bleeding: no

## 2018-07-22 NOTE — Anesthesia Preprocedure Evaluation (Addendum)
Anesthesia Evaluation  Patient identified by MRN, date of birth, ID band Patient awake    Reviewed: Allergy & Precautions, NPO status , Patient's Chart, lab work & pertinent test results  Airway Mallampati: III  TM Distance: >3 FB Neck ROM: Full    Dental no notable dental hx. (+) Teeth Intact, Caps   Pulmonary asthma ,    Pulmonary exam normal breath sounds clear to auscultation       Cardiovascular hypertension, Pt. on medications and Pt. on home beta blockers Normal cardiovascular exam Rhythm:Regular Rate:Normal     Neuro/Psych  Headaches, Anxiety    GI/Hepatic Neg liver ROS, GERD  ,  Endo/Other  Morbid obesityMultinodular goiter  Renal/GU negative Renal ROS  negative genitourinary   Musculoskeletal negative musculoskeletal ROS (+)   Abdominal (+) + obese,   Peds  Hematology  (+) anemia ,   Anesthesia Other Findings   Reproductive/Obstetrics Submucosal fibroid DUB                           Anesthesia Physical Anesthesia Plan  ASA: III  Anesthesia Plan: Spinal   Post-op Pain Management:    Induction: Intravenous  PONV Risk Score and Plan: 2 and Ondansetron, Propofol infusion and Treatment may vary due to age or medical condition  Airway Management Planned: Natural Airway, Simple Face Mask and Nasal Cannula  Additional Equipment:   Intra-op Plan:   Post-operative Plan:   Informed Consent: I have reviewed the patients History and Physical, chart, labs and discussed the procedure including the risks, benefits and alternatives for the proposed anesthesia with the patient or authorized representative who has indicated his/her understanding and acceptance.     Dental advisory given  Plan Discussed with: CRNA and Surgeon  Anesthesia Plan Comments: (Patient requests no General Anesthesia. Discussed with Dr. Garwin Brothers, large submucous fibroid will need either GA or SAB. Patient is  ok with SAB.)       Anesthesia Quick Evaluation

## 2018-07-23 ENCOUNTER — Encounter (HOSPITAL_BASED_OUTPATIENT_CLINIC_OR_DEPARTMENT_OTHER): Payer: Self-pay | Admitting: Obstetrics and Gynecology

## 2018-07-24 ENCOUNTER — Encounter (INDEPENDENT_AMBULATORY_CARE_PROVIDER_SITE_OTHER): Payer: Self-pay | Admitting: Family Medicine

## 2018-07-24 ENCOUNTER — Ambulatory Visit (INDEPENDENT_AMBULATORY_CARE_PROVIDER_SITE_OTHER): Payer: 59 | Admitting: Family Medicine

## 2018-07-24 VITALS — BP 149/87 | HR 87 | Temp 97.7°F | Ht 60.0 in | Wt 206.0 lb

## 2018-07-24 DIAGNOSIS — Z9189 Other specified personal risk factors, not elsewhere classified: Secondary | ICD-10-CM | POA: Diagnosis not present

## 2018-07-24 DIAGNOSIS — E559 Vitamin D deficiency, unspecified: Secondary | ICD-10-CM

## 2018-07-24 DIAGNOSIS — I10 Essential (primary) hypertension: Secondary | ICD-10-CM

## 2018-07-24 DIAGNOSIS — R7303 Prediabetes: Secondary | ICD-10-CM

## 2018-07-24 DIAGNOSIS — Z6841 Body Mass Index (BMI) 40.0 and over, adult: Secondary | ICD-10-CM

## 2018-07-24 MED ORDER — VITAMIN D (ERGOCALCIFEROL) 1.25 MG (50000 UNIT) PO CAPS: 50000.0000 [IU] | ORAL_CAPSULE | ORAL | 0 refills | Status: DC

## 2018-07-27 NOTE — Progress Notes (Signed)
Office: [phone]  /  Fax: [phone]   HPI:   Chief Complaint: OBESITY Laura [name] is here to discuss her progress with her obesity treatment plan. She is on the Category 3 plan + 100 calories and is following her eating plan approximately 90 % of the time. She states she is walking for 30 minutes 3 times per week. Laura [name] is rarely eating snack calories. She does not eat the bread on the plan.  Her weight is 206 lb (93.4 kg) today and has had a weight loss of 7 pounds over a period of 2 weeks since her last visit. She has lost 13 lbs since starting treatment with [place].  Vitamin D Deficiency Laura [name] has a diagnosis of vitamin D deficiency. She is currently taking prescription Vit D, but level is not at goal. Last Vit D level was 15.6 on 06/25/2018. She notes fatigue and denies nausea, vomiting or muscle weakness.  At risk for osteopenia and osteoporosis Laura [name] is at higher risk of osteopenia and osteoporosis due to vitamin D deficiency.   Pre-Diabetes Laura [name] has a diagnosis of pre-diabetes based on her elevated Hgb A1c and was informed this puts her at greater risk of developing diabetes. She is not taking metformin currently and continues to work on diet and exercise to decrease risk of diabetes. She denies polyphagia or hypoglycemia. Lab Results  Component Value Date   HGBA1C 5.7 (H) 06/25/2018    Hypertension Laura [name] is a 43 y.o. female with hypertension. Laura [name]'s blood pressure is elevated today. She states home blood pressures are 130's over 80's. Her blood pressure is always elevated at office visits. She takes Lasix for edema. She denies chest pain or shortness of breath. She is working on weight loss to help control her blood pressure with the goal of decreasing her risk of heart attack and stroke. Laura [name]'s blood pressure is not currently controlled. BP Readings from Last 3 Encounters:  07/24/18 (!) 149/87  07/22/18 (!) 152/95  07/17/18 (!) 155/85     ASSESSMENT AND PLAN:  Vitamin D deficiency - Plan: Vitamin D, Ergocalciferol, (DRISDOL) 1.25 MG (50000 UT) CAPS capsule  Prediabetes  Essential hypertension  At risk for osteoporosis  Class 3 severe obesity with serious comorbidity and body mass index (BMI) of 40.0 to 44.9 in adult, unspecified obesity type ([place])  PLAN:  Vitamin D Deficiency Laura [name] was informed that low vitamin D levels contributes to fatigue and are associated with obesity, breast, and colon cancer. Laura [name] agrees to continue taking prescription Vit D @50 ,000 IU every week #4 and we will refill for 1 month. She will follow up for routine testing of vitamin D, at least 2-3 times per year. She was informed of the risk of over-replacement of vitamin D and agrees to not increase her dose unless she discusses this with [place] first. Laura [name] agrees to follow up with our clinic in 2 weeks.  At risk for osteopenia and osteoporosis Laura [name] was given extended (15 minutes) osteoporosis prevention counseling today. Laura [name] is at risk for osteopenia and osteoporsis due to her vitamin D deficiency. She was encouraged to take her vitamin D and follow her higher calcium diet and increase strengthening exercise to help strengthen her bones and decrease her risk of osteopenia and osteoporosis.  Pre-Diabetes Laura [name] will continue her meal plan, and will continue to work on weight loss, exercise, and decreasing simple carbohydrates in her diet to help decrease the risk of diabetes.  Laura [name] agrees to follow up with our clinic in 2  weeks as directed to monitor her progress.  Hypertension We discussed sodium restriction, working on healthy weight loss, and a regular exercise program as the means to achieve improved blood pressure control. Laura [name] agreed with this plan and agreed to follow up as directed. We will continue to monitor her blood pressure as well as her progress with the above lifestyle modifications. Laura [name] agrees to continue  taking losartan and labetalol, and will watch for signs of hypotension as she continues her lifestyle modifications. Laura [name] agrees to follow up with our clinic in 2 weeks.  Obesity Laura [name] is currently in the action stage of change. As such, her goal is to continue with weight loss efforts She has agreed to follow the Category 3 plan + 100 calories and breakfast options Laura [name] will continue current exercise regimen for weight loss and overall health benefits. We discussed the following Behavioral Modification Strategies today: increasing lean protein intake, decreasing sodium intake, and keep a strict food journal Added breakfast options. Laura [name] is to eat all the food on the plan.  Laura [name] has agreed to follow up with our clinic in 2 weeks. She was informed of the importance of frequent follow up visits to maximize her success with intensive lifestyle modifications for her multiple health conditions.  ALLERGIES: No Known Allergies  MEDICATIONS: Current Outpatient Medications on File Prior to Visit  Medication Sig Dispense Refill  . acetaminophen (TYLENOL) 325 MG tablet Take 325 mg by mouth every 6 (six) hours as needed for moderate pain or headache.    . albuterol (PROVENTIL HFA;VENTOLIN HFA) 108 (90 BASE) MCG/ACT inhaler Inhale 2 puffs into the lungs every 4 (four) hours as needed. (Patient taking differently: Inhale 2 puffs into the lungs every 4 (four) hours as needed for wheezing or shortness of breath. ) 3 Inhaler 3  . azelastine (OPTIVAR) 0.05 % ophthalmic solution Place 1 drop into both eyes 2 (two) times daily. (Patient taking differently: Place 1 drop into both eyes 2 (two) times daily as needed (dry eyes). ) 6 mL 12  . clotrimazole (MYCELEX) 10 MG troche Take 1 tablet (10 mg total) by mouth 5 (five) times daily. (Patient taking differently: Take 10 mg by mouth 5 (five) times daily as needed (sore throat). ) 180 tablet 1  . Ferrous Sulfate (FEOSOL PO) Take 1 tablet by mouth daily.     . fluticasone (FLONASE) 50 MCG/ACT nasal spray USE 2 SPRAYS IN BOTH NOSTRILS DAILY (Patient taking differently: Place 2 sprays into both nostrils daily as needed for allergies. ) 16 g 2  . furosemide (LASIX) 40 MG tablet TAKE 1 TABLET BY MOUTH EVERY DAY (Patient taking differently: Take 40 mg by mouth daily as needed for edema. ) 90 tablet 1  . HYDROcodone-acetaminophen (NORCO/VICODIN) 5-325 MG tablet Take 1 tablet by mouth every 6 (six) hours as needed for up to 5 days for moderate pain. 15 tablet 0  . KLOR-CON M10 10 MEQ tablet TAKE 1 TABLET BY MOUTH EVERY DAY (Patient taking differently: Take 10 mEq by mouth daily as needed (swelling). Take with lasix) 90 tablet 1  . labetalol (NORMODYNE) 200 MG tablet Take 2 tablets (400 mg total) by mouth 2 (two) times daily. 360 tablet 3  . Liniments (SALONPAS PAIN RELIEF PATCH EX) Apply 1 patch topically daily as needed (pain).    Marland Kitchen losartan (COZAAR) 100 MG tablet Take 1 tablet (100 mg total) by mouth daily. 90 tablet 3   No current facility-administered medications on file prior to visit.  PAST MEDICAL HISTORY: Past Medical History:  Diagnosis Date  . ALLERGIC RHINITIS 06/01/2008  . Anemia   . Anxiety 08/15/2010  . ASTHMA 06/01/2008  . Asthma   . BURSITIS, LEFT HIP 04/28/2010  . GERD (gastroesophageal reflux disease)   . GOITER, MULTINODULAR 06/28/2008  . Hirsutism 06/02/2009  . HYPERTENSION 06/01/2008  . Palpitations 07/25/2010  . PERIPHERAL EDEMA 04/28/2010  . Pharyngitis, acute 10/09/2010  . TONSILLITIS, RECURRENT 05/05/2009    PAST SURGICAL HISTORY: Past Surgical History:  Procedure Laterality Date  . bilateral turbiante reductions    . CESAREAN SECTION  2007  . DILATATION & CURETTAGE/HYSTEROSCOPY WITH MYOSURE N/A 07/22/2018   Procedure: DILATATION & CURETTAGE/HYSTEROSCOPY WITH MYOSURE XL;  Surgeon: Servando Salina, MD;  Location: [facility];  Service: Gynecology;  Laterality: N/A;  . s/p sinus surgury  2006    SOCIAL  HISTORY: Social History   Tobacco Use  . Smoking status: Never Smoker  . Smokeless tobacco: Never Used  Substance Use Topics  . Alcohol use: No  . Drug use: No    FAMILY HISTORY: Family History  Problem Relation Age of Onset  . Hypertension Mother   . Hyperlipidemia Mother   . Hypertension Maternal Grandmother   . Hyperlipidemia Maternal Grandmother   . Hypertension Maternal Grandfather   . Hyperlipidemia Maternal Grandfather   . Lung cancer Maternal Grandfather     ROS: Review of Systems  Constitutional: Positive for malaise/fatigue and weight loss.  Respiratory: Negative for shortness of breath.   Cardiovascular: Negative for chest pain.  Gastrointestinal: Negative for nausea and vomiting.  Musculoskeletal:       Negative muscle weakness  Endo/Heme/Allergies:       Negative hypoglycemia Negative polyphagia    PHYSICAL EXAM: Blood pressure (!) 149/87, pulse 87, temperature 97.7 F (36.5 C), height 5' (1.524 m), weight 206 lb (93.4 kg), last menstrual period 07/07/2018, SpO2 100 %. Body mass index is 40.23 kg/m. Physical Exam Vitals signs reviewed.  Constitutional:      Appearance: Normal appearance. She is obese.  Cardiovascular:     Rate and Rhythm: Normal rate.     Pulses: Normal pulses.  Pulmonary:     Effort: Pulmonary effort is normal.     Breath sounds: Normal breath sounds.  Musculoskeletal: Normal range of motion.  Skin:    General: Skin is warm and dry.  Neurological:     Mental Status: She is alert and oriented to person, place, and time.  Psychiatric:        Mood and Affect: Mood normal.        Behavior: Behavior normal.     RECENT LABS AND TESTS: BMET    Component Value Date/Time   NA 136 07/17/2018 1429   NA 138 06/25/2018 1247   K 4.0 07/17/2018 1429   CL 104 07/17/2018 1429   CO2 23 07/17/2018 1429   GLUCOSE 88 07/17/2018 1429   BUN 17 07/17/2018 1429   BUN 10 06/25/2018 1247   CREATININE 0.74 07/17/2018 1429   CALCIUM 9.5  07/17/2018 1429   GFRNONAA >60 07/17/2018 1429   GFRAA >60 07/17/2018 1429   Lab Results  Component Value Date   HGBA1C 5.7 (H) 06/25/2018   Lab Results  Component Value Date   INSULIN 17.2 06/25/2018   CBC    Component Value Date/Time   WBC 5.6 07/17/2018 1429   RBC 4.58 07/17/2018 1429   HGB 14.7 07/17/2018 1429   HCT 44.4 07/17/2018 1429   PLT 198 07/17/2018  1429   MCV 96.9 07/17/2018 1429   MCH 32.1 07/17/2018 1429   MCHC 33.1 07/17/2018 1429   RDW 12.1 07/17/2018 1429   LYMPHSABS 2.0 10/11/2017 1516   MONOABS 0.7 10/11/2017 1516   EOSABS 0.3 10/11/2017 1516   BASOSABS 0.0 10/11/2017 1516   Iron/TIBC/Ferritin/ %Sat    Component Value Date/Time   IRON 153 (H) 05/17/2016 1555   IRONPCTSAT 45.5 05/17/2016 1555   Lipid Panel     Component Value Date/Time   CHOL 174 10/11/2017 1516   TRIG 98.0 10/11/2017 1516   HDL 48.30 10/11/2017 1516   CHOLHDL 4 10/11/2017 1516   VLDL 19.6 10/11/2017 1516   LDLCALC 106 (H) 10/11/2017 1516   Hepatic Function Panel     Component Value Date/Time   PROT 6.8 06/25/2018 1247   ALBUMIN 4.4 06/25/2018 1247   AST 11 06/25/2018 1247   ALT 15 06/25/2018 1247   ALKPHOS 51 06/25/2018 1247   BILITOT 0.4 06/25/2018 1247   BILIDIR 0.1 10/11/2017 1516      Component Value Date/Time   TSH 1.13 10/11/2017 1516   TSH 0.64 05/17/2016 1555   TSH 1.32 06/25/2014 1428      OBESITY BEHAVIORAL INTERVENTION VISIT  Today's visit was # 3   Starting weight: 219 lbs Starting date: 06/25/2018 Today's weight : 206 lbs  Today's date: 07/24/2018 Total lbs lost to date: 13    07/24/2018  Height 5' (1.524 m)  Weight 206 lb (93.4 kg)  BMI (Calculated) 40.23  BLOOD PRESSURE - SYSTOLIC 086  BLOOD PRESSURE - DIASTOLIC 87   Body Fat % 76.1 %  Total Body Water (lbs) 87.8 lbs     ASK: We discussed the diagnosis of obesity with [place] today and Lilliah agreed to give [place] permission to discuss obesity behavioral modification  therapy today.  ASSESS: Maraya has the diagnosis of obesity and her BMI today is 40.23 Dandrea is in the action stage of change   ADVISE: Leianne was educated on the multiple health risks of obesity as well as the benefit of weight loss to improve her health. She was advised of the need for long term treatment and the importance of lifestyle modifications to improve her current health and to decrease her risk of future health problems.  AGREE: Multiple dietary modification options and treatment options were discussed and  Corvette agreed to follow the recommendations documented in the above note.  ARRANGE: Jilliana was educated on the importance of frequent visits to treat obesity as outlined per CMS and USPSTF guidelines and agreed to schedule her next follow up appointment today.  Wilhemena Durie, am acting as Location manager for [employer], FNP-C.  I have reviewed the above documentation for accuracy and completeness, and I agree with the above.  - Torre Schaumburg, FNP-C.

## 2018-07-29 ENCOUNTER — Encounter (INDEPENDENT_AMBULATORY_CARE_PROVIDER_SITE_OTHER): Payer: Self-pay | Admitting: Family Medicine

## 2018-07-29 DIAGNOSIS — E559 Vitamin D deficiency, unspecified: Secondary | ICD-10-CM | POA: Insufficient documentation

## 2018-08-12 ENCOUNTER — Ambulatory Visit (INDEPENDENT_AMBULATORY_CARE_PROVIDER_SITE_OTHER): Payer: 59 | Admitting: Family Medicine

## 2018-08-12 ENCOUNTER — Other Ambulatory Visit: Payer: Self-pay

## 2018-08-12 ENCOUNTER — Encounter (INDEPENDENT_AMBULATORY_CARE_PROVIDER_SITE_OTHER): Payer: Self-pay | Admitting: Family Medicine

## 2018-08-12 VITALS — BP 165/108 | HR 82 | Temp 97.4°F | Ht 60.0 in | Wt 202.0 lb

## 2018-08-12 DIAGNOSIS — I10 Essential (primary) hypertension: Secondary | ICD-10-CM | POA: Diagnosis not present

## 2018-08-12 DIAGNOSIS — Z9189 Other specified personal risk factors, not elsewhere classified: Secondary | ICD-10-CM

## 2018-08-12 DIAGNOSIS — Z6839 Body mass index (BMI) 39.0-39.9, adult: Secondary | ICD-10-CM

## 2018-08-12 DIAGNOSIS — E559 Vitamin D deficiency, unspecified: Secondary | ICD-10-CM | POA: Diagnosis not present

## 2018-08-12 MED ORDER — CHLORTHALIDONE 25 MG PO TABS: ORAL_TABLET | ORAL | 0 refills | Status: DC

## 2018-08-13 ENCOUNTER — Encounter (INDEPENDENT_AMBULATORY_CARE_PROVIDER_SITE_OTHER): Payer: Self-pay

## 2018-08-13 NOTE — Progress Notes (Signed)
Office: [phone]  /  Fax: [phone]   HPI:   Chief Complaint: OBESITY Laura [name] is here to discuss her progress with her obesity treatment plan. She is on the Category 3 plan + 100 calories with breakfast options and is following her eating plan approximately 90 % of the time. She states she is walking for 35-40 minutes 2-3 times per week. Mikiya has had a good experience thus far with our clinic and meal plan. Her biggest obstacle in the past 2 weeks is limiting eating out. She denies foreseeable obstacles in the next 2 weeks.  Her weight is 202 lb (91.6 kg) today and has had a weight loss of 4 pounds over a period of 2 to 3 weeks since her last visit. She has lost 17 lbs since starting treatment with [place].  Hypertension Laura [name] is a 43 y.o. female with hypertension. Laura [name]'s blood pressure is uncontrolled today. She reports blood pressure at home to be 130's over upper 80's. She denies chest pain, chest pressure, or headaches. She is working on weight loss to help control her blood pressure with the goal of decreasing her risk of heart attack and stroke.   At risk for cardiovascular disease Laura [name] is at a higher than average risk for cardiovascular disease due to obesity and hypertension. She currently denies any chest pain.  Vitamin D Deficiency Laura [name] has a diagnosis of vitamin D deficiency. She is currently taking prescription Vit D. She notes fatigue and denies nausea, vomiting or muscle weakness.  ASSESSMENT AND PLAN:  Essential hypertension - Plan: chlorthalidone (HYGROTON) 25 MG tablet  Vitamin D deficiency  At risk for heart disease  Class 2 severe obesity with serious comorbidity and body mass index (BMI) of 39.0 to 39.9 in adult, unspecified obesity type ([place])  PLAN:  Hypertension We discussed sodium restriction, working on healthy weight loss, and a regular exercise program as the means to achieve improved blood pressure control. Laura [name] agreed  with this plan and agreed to follow up as directed. We will continue to monitor her blood pressure as well as her progress with the above lifestyle modifications. Laura [name] agrees to start chlorthalidone 25 mg 1/2 tablet PO q daily #30 with no refills. She will watch for signs of hypotension as she continues her lifestyle modifications. Laura [name] agrees to follow up with our clinic in 2 weeks.  Cardiovascular risk counseling Laura [name] was given extended (15 minutes) coronary artery disease prevention counseling today. She is 43 y.o. female and has risk factors for heart disease including obesity and hypertension. We discussed intensive lifestyle modifications today with an emphasis on specific weight loss instructions and strategies. Pt was also informed of the importance of increasing exercise and decreasing saturated fats to help prevent heart disease.  Vitamin D Deficiency Laura [name] was informed that low vitamin D levels contributes to fatigue and are associated with obesity, breast, and colon cancer. Laura [name] agrees to continue taking prescription Vit D @50 ,000 IU every week, no refill needed. She will follow up for routine testing of vitamin D, at least 2-3 times per year. She was informed of the risk of over-replacement of vitamin D and agrees to not increase her dose unless she discusses this with [place] first. Laura [name] agrees to follow up with our clinic in 2 weeks.  Obesity Laura [name] is currently in the action stage of change. As such, her goal is to continue with weight loss efforts She has agreed to follow the Category 3 plan + 100 calories with breakfast options  Laura [name] has been instructed to work up to a goal of 150 minutes of combined cardio and strengthening exercise per week for weight loss and overall health benefits. We discussed the following Behavioral Modification Strategies today: increasing lean protein intake, increasing vegetables, work on meal planning and easy cooking plans, better snacking  choices, and planning for success   Laura [name] has agreed to follow up with our clinic in 2 weeks. She was informed of the importance of frequent follow up visits to maximize her success with intensive lifestyle modifications for her multiple health conditions.  ALLERGIES: No Known Allergies  MEDICATIONS: Current Outpatient Medications on File Prior to Visit  Medication Sig Dispense Refill  . acetaminophen (TYLENOL) 325 MG tablet Take 325 mg by mouth every 6 (six) hours as needed for moderate pain or headache.    . albuterol (PROVENTIL HFA;VENTOLIN HFA) 108 (90 BASE) MCG/ACT inhaler Inhale 2 puffs into the lungs every 4 (four) hours as needed. (Patient taking differently: Inhale 2 puffs into the lungs every 4 (four) hours as needed for wheezing or shortness of breath. ) 3 Inhaler 3  . azelastine (OPTIVAR) 0.05 % ophthalmic solution Place 1 drop into both eyes 2 (two) times daily. (Patient taking differently: Place 1 drop into both eyes 2 (two) times daily as needed (dry eyes). ) 6 mL 12  . clotrimazole (MYCELEX) 10 MG troche Take 1 tablet (10 mg total) by mouth 5 (five) times daily. (Patient taking differently: Take 10 mg by mouth 5 (five) times daily as needed (sore throat). ) 180 tablet 1  . Ferrous Sulfate (FEOSOL PO) Take 1 tablet by mouth daily.    . fluticasone (FLONASE) 50 MCG/ACT nasal spray USE 2 SPRAYS IN BOTH NOSTRILS DAILY (Patient taking differently: Place 2 sprays into both nostrils daily as needed for allergies. ) 16 g 2  . furosemide (LASIX) 40 MG tablet TAKE 1 TABLET BY MOUTH EVERY DAY (Patient taking differently: Take 40 mg by mouth daily as needed for edema. ) 90 tablet 1  . KLOR-CON M10 10 MEQ tablet TAKE 1 TABLET BY MOUTH EVERY DAY (Patient taking differently: Take 10 mEq by mouth daily as needed (swelling). Take with lasix) 90 tablet 1  . labetalol (NORMODYNE) 200 MG tablet Take 2 tablets (400 mg total) by mouth 2 (two) times daily. 360 tablet 3  . Liniments (SALONPAS PAIN  RELIEF PATCH EX) Apply 1 patch topically daily as needed (pain).    Marland Kitchen losartan (COZAAR) 100 MG tablet Take 1 tablet (100 mg total) by mouth daily. 90 tablet 3  . Vitamin D, Ergocalciferol, (DRISDOL) 1.25 MG (50000 UT) CAPS capsule Take 1 capsule (50,000 Units total) by mouth every 7 (seven) days. 4 capsule 0   No current facility-administered medications on file prior to visit.     PAST MEDICAL HISTORY: Past Medical History:  Diagnosis Date  . ALLERGIC RHINITIS 06/01/2008  . Anemia   . Anxiety 08/15/2010  . ASTHMA 06/01/2008  . Asthma   . BURSITIS, LEFT HIP 04/28/2010  . GERD (gastroesophageal reflux disease)   . GOITER, MULTINODULAR 06/28/2008  . Hirsutism 06/02/2009  . HYPERTENSION 06/01/2008  . Palpitations 07/25/2010  . PERIPHERAL EDEMA 04/28/2010  . Pharyngitis, acute 10/09/2010  . TONSILLITIS, RECURRENT 05/05/2009    PAST SURGICAL HISTORY: Past Surgical History:  Procedure Laterality Date  . bilateral turbiante reductions    . CESAREAN SECTION  2007  . DILATATION & CURETTAGE/HYSTEROSCOPY WITH MYOSURE N/A 07/22/2018   Procedure: DILATATION & CURETTAGE/HYSTEROSCOPY WITH MYOSURE XL;  Surgeon:  Servando Salina, MD;  Location: [facility];  Service: Gynecology;  Laterality: N/A;  . s/p sinus surgury  2006    SOCIAL HISTORY: Social History   Tobacco Use  . Smoking status: Never Smoker  . Smokeless tobacco: Never Used  Substance Use Topics  . Alcohol use: No  . Drug use: No    FAMILY HISTORY: Family History  Problem Relation Age of Onset  . Hypertension Mother   . Hyperlipidemia Mother   . Hypertension Maternal Grandmother   . Hyperlipidemia Maternal Grandmother   . Hypertension Maternal Grandfather   . Hyperlipidemia Maternal Grandfather   . Lung cancer Maternal Grandfather     ROS: Review of Systems  Constitutional: Positive for malaise/fatigue and weight loss.  Cardiovascular: Negative for chest pain.       Negative chest pressure  Gastrointestinal:  Negative for nausea and vomiting.  Musculoskeletal:       Negative muscle weakness  Neurological: Negative for headaches.    PHYSICAL EXAM: Blood pressure (!) 165/108, pulse 82, temperature (!) 97.4 F (36.3 C), temperature source Oral, height 5' (1.524 m), weight 202 lb (91.6 kg), SpO2 100 %. Body mass index is 39.45 kg/m. Physical Exam Vitals signs reviewed.  Constitutional:      Appearance: Normal appearance. She is obese.  Cardiovascular:     Rate and Rhythm: Normal rate.     Pulses: Normal pulses.  Pulmonary:     Effort: Pulmonary effort is normal.     Breath sounds: Normal breath sounds.  Musculoskeletal: Normal range of motion.  Skin:    General: Skin is warm and dry.  Neurological:     Mental Status: She is alert and oriented to person, place, and time.  Psychiatric:        Mood and Affect: Mood normal.        Behavior: Behavior normal.     RECENT LABS AND TESTS: BMET    Component Value Date/Time   NA 136 07/17/2018 1429   NA 138 06/25/2018 1247   K 4.0 07/17/2018 1429   CL 104 07/17/2018 1429   CO2 23 07/17/2018 1429   GLUCOSE 88 07/17/2018 1429   BUN 17 07/17/2018 1429   BUN 10 06/25/2018 1247   CREATININE 0.74 07/17/2018 1429   CALCIUM 9.5 07/17/2018 1429   GFRNONAA >60 07/17/2018 1429   GFRAA >60 07/17/2018 1429   Lab Results  Component Value Date   HGBA1C 5.7 (H) 06/25/2018   Lab Results  Component Value Date   INSULIN 17.2 06/25/2018   CBC    Component Value Date/Time   WBC 5.6 07/17/2018 1429   RBC 4.58 07/17/2018 1429   HGB 14.7 07/17/2018 1429   HCT 44.4 07/17/2018 1429   PLT 198 07/17/2018 1429   MCV 96.9 07/17/2018 1429   MCH 32.1 07/17/2018 1429   MCHC 33.1 07/17/2018 1429   RDW 12.1 07/17/2018 1429   LYMPHSABS 2.0 10/11/2017 1516   MONOABS 0.7 10/11/2017 1516   EOSABS 0.3 10/11/2017 1516   BASOSABS 0.0 10/11/2017 1516   Iron/TIBC/Ferritin/ %Sat    Component Value Date/Time   IRON 153 (H) 05/17/2016 1555   IRONPCTSAT  45.5 05/17/2016 1555   Lipid Panel     Component Value Date/Time   CHOL 174 10/11/2017 1516   TRIG 98.0 10/11/2017 1516   HDL 48.30 10/11/2017 1516   CHOLHDL 4 10/11/2017 1516   VLDL 19.6 10/11/2017 1516   LDLCALC 106 (H) 10/11/2017 1516   Hepatic Function Panel  Component Value Date/Time   PROT 6.8 06/25/2018 1247   ALBUMIN 4.4 06/25/2018 1247   AST 11 06/25/2018 1247   ALT 15 06/25/2018 1247   ALKPHOS 51 06/25/2018 1247   BILITOT 0.4 06/25/2018 1247   BILIDIR 0.1 10/11/2017 1516      Component Value Date/Time   TSH 1.13 10/11/2017 1516   TSH 0.64 05/17/2016 1555   TSH 1.32 06/25/2014 1428      OBESITY BEHAVIORAL INTERVENTION VISIT  Today's visit was # 4   Starting weight: 219 lbs Starting date: 06/25/2018 Today's weight : 202 lbs  Today's date: 08/12/2018 Total lbs lost to date: 17    08/12/2018  Height 5' (1.524 m)  Weight 202 lb (91.6 kg)  BMI (Calculated) 39.45  BLOOD PRESSURE - SYSTOLIC 150  BLOOD PRESSURE - DIASTOLIC 569   Body Fat % 42.8 %  Total Body Water (lbs) 84 lbs     ASK: We discussed the diagnosis of obesity with Revonda Humphrey Schnyder today and Sae agreed to give [place] permission to discuss obesity behavioral modification therapy today.  ASSESS: Milianna has the diagnosis of obesity and her BMI today is 39.45 Glenis is in the action stage of change   ADVISE: Kyrstal was educated on the multiple health risks of obesity as well as the benefit of weight loss to improve her health. She was advised of the need for long term treatment and the importance of lifestyle modifications to improve her current health and to decrease her risk of future health problems.  AGREE: Multiple dietary modification options and treatment options were discussed and  Jette agreed to follow the recommendations documented in the above note.  ARRANGE: Kenyona was educated on the importance of frequent visits to treat obesity as outlined per CMS and USPSTF  guidelines and agreed to schedule her next follow up appointment today.  I, Trixie Dredge, am acting as transcriptionist for Ilene Qua, MD  I have reviewed the above documentation for accuracy and completeness, and I agree with the above. - Ilene Qua, MD

## 2018-08-15 ENCOUNTER — Ambulatory Visit: Payer: 59 | Admitting: Internal Medicine

## 2018-08-15 ENCOUNTER — Encounter: Payer: Self-pay | Admitting: Internal Medicine

## 2018-08-15 ENCOUNTER — Other Ambulatory Visit: Payer: Self-pay

## 2018-08-15 VITALS — BP 136/88 | HR 91 | Temp 98.5°F | Ht 60.0 in | Wt 205.0 lb

## 2018-08-15 DIAGNOSIS — Z23 Encounter for immunization: Secondary | ICD-10-CM

## 2018-08-15 DIAGNOSIS — Z0000 Encounter for general adult medical examination without abnormal findings: Secondary | ICD-10-CM

## 2018-08-15 DIAGNOSIS — I10 Essential (primary) hypertension: Secondary | ICD-10-CM

## 2018-08-15 MED ORDER — METOPROLOL SUCCINATE ER 100 MG PO TB24: 100.0000 mg | ORAL_TABLET | Freq: Every day | ORAL | 3 refills | Status: DC

## 2018-08-15 NOTE — Progress Notes (Signed)
Subjective:    Patient ID: Laura [name], female    DOB: 1975-11-16, 43 y.o.   MRN: [id number]  HPI    Here for wellness and f/u;  Overall doing ok;  Pt denies Chest pain, worsening SOB, DOE, wheezing, orthopnea, PND, worsening LE edema, palpitations, dizziness or syncope.  Pt denies neurological change such as new headache, facial or extremity weakness.  Pt denies polydipsia, polyuria, or low sugar symptoms. Pt states overall good compliance with treatment and medications, good tolerability, and has been trying to follow appropriate diet.  Pt denies worsening depressive symptoms, suicidal ideation or panic. No fever, night sweats, wt loss, loss of appetite, or other constitutional symptoms.  Pt states good ability with ADL's, has low fall risk, home safety reviewed and adequate, no other significant changes in hearing or vision, and only occasionally active with exercise. S/p D&C x 3 wks.  [facility], [facility], down 17 lbs overall.  BP has been persistently mildly elevated despite good compliance with multiple meds. Past Medical History:  Diagnosis Date  . ALLERGIC RHINITIS 06/01/2008  . Anemia   . Anxiety 08/15/2010  . ASTHMA 06/01/2008  . Asthma   . BURSITIS, LEFT HIP 04/28/2010  . GERD (gastroesophageal reflux disease)   . GOITER, MULTINODULAR 06/28/2008  . Hirsutism 06/02/2009  . HYPERTENSION 06/01/2008  . Palpitations 07/25/2010  . PERIPHERAL EDEMA 04/28/2010  . Pharyngitis, acute 10/09/2010  . TONSILLITIS, RECURRENT 05/05/2009   Past Surgical History:  Procedure Laterality Date  . bilateral turbiante reductions    . CESAREAN SECTION  2007  . DILATATION & CURETTAGE/HYSTEROSCOPY WITH MYOSURE N/A 07/22/2018   Procedure: DILATATION & CURETTAGE/HYSTEROSCOPY WITH MYOSURE XL;  Surgeon: Servando Salina, MD;  Location: [facility];  Service: Gynecology;  Laterality: N/A;  . s/p sinus surgury  2006    reports that she has never smoked. She has  never used smokeless tobacco. She reports that she does not drink alcohol or use drugs. family history includes Hyperlipidemia in her maternal grandfather, maternal grandmother, and mother; Hypertension in her maternal grandfather, maternal grandmother, and mother; Lung cancer in her maternal grandfather. No Known Allergies Current Outpatient Medications on File Prior to Visit  Medication Sig Dispense Refill  . acetaminophen (TYLENOL) 325 MG tablet Take 325 mg by mouth every 6 (six) hours as needed for moderate pain or headache.    . albuterol (PROVENTIL HFA;VENTOLIN HFA) 108 (90 BASE) MCG/ACT inhaler Inhale 2 puffs into the lungs every 4 (four) hours as needed. (Patient taking differently: Inhale 2 puffs into the lungs every 4 (four) hours as needed for wheezing or shortness of breath. ) 3 Inhaler 3  . azelastine (OPTIVAR) 0.05 % ophthalmic solution Place 1 drop into both eyes 2 (two) times daily. (Patient taking differently: Place 1 drop into both eyes 2 (two) times daily as needed (dry eyes). ) 6 mL 12  . chlorthalidone (HYGROTON) 25 MG tablet Take 1/2 tab daily 30 tablet 0  . clotrimazole (MYCELEX) 10 MG troche Take 1 tablet (10 mg total) by mouth 5 (five) times daily. (Patient taking differently: Take 10 mg by mouth 5 (five) times daily as needed (sore throat). ) 180 tablet 1  . Ferrous Sulfate (FEOSOL PO) Take 1 tablet by mouth daily.    . fluticasone (FLONASE) 50 MCG/ACT nasal spray USE 2 SPRAYS IN BOTH NOSTRILS DAILY (Patient taking differently: Place 2 sprays into both nostrils daily as needed for allergies. ) 16 g 2  .  furosemide (LASIX) 40 MG tablet TAKE 1 TABLET BY MOUTH EVERY DAY (Patient taking differently: Take 40 mg by mouth daily as needed for edema. ) 90 tablet 1  . KLOR-CON M10 10 MEQ tablet TAKE 1 TABLET BY MOUTH EVERY DAY (Patient taking differently: Take 10 mEq by mouth daily as needed (swelling). Take with lasix) 90 tablet 1  . Liniments (SALONPAS PAIN RELIEF PATCH EX) Apply 1  patch topically daily as needed (pain).    Marland Kitchen losartan (COZAAR) 100 MG tablet Take 1 tablet (100 mg total) by mouth daily. 90 tablet 3  . Vitamin D, Ergocalciferol, (DRISDOL) 1.25 MG (50000 UT) CAPS capsule Take 1 capsule (50,000 Units total) by mouth every 7 (seven) days. 4 capsule 0   No current facility-administered medications on file prior to visit.    Review of Systems  Constitutional: Negative for other unusual diaphoresis or sweats HENT: Negative for ear discharge or swelling Eyes: Negative for other worsening visual disturbances Respiratory: Negative for stridor or other swelling  Gastrointestinal: Negative for worsening distension or other blood Genitourinary: Negative for retention or other urinary change Musculoskeletal: Negative for other MSK pain or swelling Skin: Negative for color change or other new lesions Neurological: Negative for worsening tremors and other numbness  Psychiatric/Behavioral: Negative for worsening agitation or other fatigue All other system neg per pt    Objective:   Physical Exam BP 136/88   Pulse 91   Temp 98.5 F (36.9 C) (Oral)   Ht 5' (1.524 m)   Wt 205 lb (93 kg)   SpO2 93%   BMI 40.04 kg/m  VS noted,  Constitutional: Pt appears in NAD HENT: Head: NCAT.  Right Ear: External ear normal.  Left Ear: External ear normal.  Eyes: . Pupils are equal, round, and reactive to light. Conjunctivae and EOM are normal Nose: without d/c or deformity Neck: Neck supple. Gross normal ROM Cardiovascular: Normal rate and regular rhythm.   Pulmonary/Chest: Effort normal and breath sounds without rales or wheezing.  Abd:  Soft, NT, ND, + BS, no organomegaly Neurological: Pt is alert. At baseline orientation, motor grossly intact Skin: Skin is warm. No rashes, other new lesions, no LE edema Psychiatric: Pt behavior is normal without agitation  No other exam findings Lab Results  Component Value Date   WBC 5.6 07/17/2018   HGB 14.7 07/17/2018   HCT  44.4 07/17/2018   PLT 198 07/17/2018   GLUCOSE 88 07/17/2018   CHOL 174 10/11/2017   TRIG 98.0 10/11/2017   HDL 48.30 10/11/2017   LDLCALC 106 (H) 10/11/2017   ALT 15 06/25/2018   AST 11 06/25/2018   NA 136 07/17/2018   K 4.0 07/17/2018   CL 104 07/17/2018   CREATININE 0.74 07/17/2018   BUN 17 07/17/2018   CO2 23 07/17/2018   TSH 1.13 10/11/2017   HGBA1C 5.7 (H) 06/25/2018       Assessment & Plan:

## 2018-08-15 NOTE — Patient Instructions (Addendum)
You had the flu shot today  OK to stop the labetalol  Please take all new medication as prescribed - the metoprolol ER 100 mg per day  Please continue all other medications as before, and refills have been done if requested.  Please have the pharmacy call with any other refills you may need.  Please continue your efforts at being more active, low cholesterol diet, and weight control.  You are otherwise up to date with prevention measures today.  Please keep your appointments with your specialists as you may have planned  Please return in 6 months, or sooner if needed

## 2018-08-17 ENCOUNTER — Encounter: Payer: Self-pay | Admitting: Internal Medicine

## 2018-08-17 NOTE — Assessment & Plan Note (Signed)
persitently mild elevated, ok for change labetolol to metoprolol XL 100 qd, cont all other tx, f/u BP at home and next visit

## 2018-08-17 NOTE — Assessment & Plan Note (Signed)

## 2018-08-19 ENCOUNTER — Encounter (INDEPENDENT_AMBULATORY_CARE_PROVIDER_SITE_OTHER): Payer: Self-pay

## 2018-08-20 ENCOUNTER — Encounter (INDEPENDENT_AMBULATORY_CARE_PROVIDER_SITE_OTHER): Payer: Self-pay

## 2018-08-25 NOTE — Telephone Encounter (Signed)
Please address

## 2018-08-26 ENCOUNTER — Other Ambulatory Visit: Payer: Self-pay

## 2018-08-26 ENCOUNTER — Encounter (INDEPENDENT_AMBULATORY_CARE_PROVIDER_SITE_OTHER): Payer: Self-pay | Admitting: Family Medicine

## 2018-08-26 ENCOUNTER — Ambulatory Visit (INDEPENDENT_AMBULATORY_CARE_PROVIDER_SITE_OTHER): Payer: 59 | Admitting: Family Medicine

## 2018-08-26 DIAGNOSIS — E559 Vitamin D deficiency, unspecified: Secondary | ICD-10-CM

## 2018-08-26 DIAGNOSIS — I10 Essential (primary) hypertension: Secondary | ICD-10-CM | POA: Diagnosis not present

## 2018-08-26 DIAGNOSIS — Z6841 Body Mass Index (BMI) 40.0 and over, adult: Secondary | ICD-10-CM | POA: Diagnosis not present

## 2018-08-26 MED ORDER — VITAMIN D (ERGOCALCIFEROL) 1.25 MG (50000 UNIT) PO CAPS: 50000.0000 [IU] | ORAL_CAPSULE | ORAL | 0 refills | Status: DC

## 2018-08-26 MED ORDER — CHLORTHALIDONE 25 MG PO TABS: ORAL_TABLET | ORAL | 0 refills | Status: DC

## 2018-08-27 ENCOUNTER — Encounter (INDEPENDENT_AMBULATORY_CARE_PROVIDER_SITE_OTHER): Payer: Self-pay | Admitting: Family Medicine

## 2018-08-27 NOTE — Progress Notes (Addendum)
Office: [phone]  /  Fax: [phone] TeleHealth Visit:  Laura [name] has consented to this TeleHealth visit today via face time. The patient is located at home, the provider is located at the [facility] office. The participants in this visit include the listed provider and patient.   HPI:   Chief Complaint: OBESITY Laura [name] is here to discuss her progress with her obesity treatment plan. She is on the Category 3 plan + 100 calories with breakfast options and is following her eating plan approximately 90 % of the time. She states she is walking for 40 minutes 3 times per week. Laura [name] feels she may have lost weight. She does not have a scale at home. She has increased walking and is sticking to the plan very well.  We were unable to weight the patient today for this TeleHealth visit. She feels as if she has maintained her weight since her last visit. She has lost 17 lbs since starting treatment with [place].  Vitamin D Deficiency Laura [name] has a diagnosis of vitamin D deficiency. She is currently taking prescription Vit D, but level is not at goal. Last Vit D level was 15.6 on 06/25/2018. She denies nausea, vomiting or muscle weakness.  Hypertension Laura [name] is a 43 y.o. female with hypertension. Laura [name] states blood pressure at home have been running 130's/80's-90. She denies chest pain or shortness of breath. She is on losartan, metoprolol, and chlorthalidone. Her chlorthalidone was added a few weeks ago by her PCP. She is working on weight loss to help control her blood pressure with the goal of decreasing her risk of heart attack and stroke.  ASSESSMENT AND PLAN:  Vitamin D deficiency - Plan: Vitamin D, Ergocalciferol, (DRISDOL) 1.25 MG (50000 UT) CAPS capsule  Essential hypertension - Plan: chlorthalidone (HYGROTON) 25 MG tablet  Class 3 severe obesity with serious comorbidity and body mass index (BMI) of 40.0 to 44.9 in adult, unspecified obesity  type ([place])  PLAN:  Vitamin D Deficiency Laura [name] was informed that low vitamin D levels contributes to fatigue and are associated with obesity, breast, and colon cancer. Zyona agrees to continue taking prescription Vit D @50 ,000 IU every week #4 and we will refill for 1 month. She will follow up for routine testing of vitamin D, at least 2-3 times per year. She was informed of the risk of over-replacement of vitamin D and agrees to not increase her dose unless she discusses this with [place] first. Laura [name] agrees to follow up with our clinic in 2 to 3 weeks.  Hypertension We discussed sodium restriction, working on healthy weight loss, and a regular exercise program as the means to achieve improved blood pressure control. Laura [name] agreed with this plan and agreed to follow up as directed. We will continue to monitor her blood pressure as well as her progress with the above lifestyle modifications. Laura [name] will continue all her medications, and she agrees to continue taking chlorthalidone 25 mg 1/2 tablet qd #30 and we will refill for 1 month. She will watch for signs of hypotension as she continues her lifestyle modifications. Laura [name] agrees to follow up with our clinic in 2 to 3 weeks.  Obesity Laura [name] is currently in the action stage of change. As such, her goal is to continue with weight loss efforts She has agreed to follow the Category 3 plan + 100 calories with breakfast options Laura [name] will continue current exercise regimen for weight loss and overall health benefits. We discussed the following Behavioral  Modification Strategies today: keeping healthy foods in the home and planning for success We discussed 45 calorie bread substitutions.  Laura [name] has agreed to follow up with our clinic in 2 to 3 weeks. She was informed of the importance of frequent follow up visits to maximize her success with intensive lifestyle modifications for her multiple health conditions.  ALLERGIES: No Known  Allergies  MEDICATIONS: Current Outpatient Medications on File Prior to Visit  Medication Sig Dispense Refill   acetaminophen (TYLENOL) 325 MG tablet Take 325 mg by mouth every 6 (six) hours as needed for moderate pain or headache.     albuterol (PROVENTIL HFA;VENTOLIN HFA) 108 (90 BASE) MCG/ACT inhaler Inhale 2 puffs into the lungs every 4 (four) hours as needed. (Patient taking differently: Inhale 2 puffs into the lungs every 4 (four) hours as needed for wheezing or shortness of breath. ) 3 Inhaler 3   azelastine (OPTIVAR) 0.05 % ophthalmic solution Place 1 drop into both eyes 2 (two) times daily. (Patient taking differently: Place 1 drop into both eyes 2 (two) times daily as needed (dry eyes). ) 6 mL 12   clotrimazole (MYCELEX) 10 MG troche Take 1 tablet (10 mg total) by mouth 5 (five) times daily. (Patient taking differently: Take 10 mg by mouth 5 (five) times daily as needed (sore throat). ) 180 tablet 1   Ferrous Sulfate (FEOSOL PO) Take 1 tablet by mouth daily.     fluticasone (FLONASE) 50 MCG/ACT nasal spray USE 2 SPRAYS IN BOTH NOSTRILS DAILY (Patient taking differently: Place 2 sprays into both nostrils daily as needed for allergies. ) 16 g 2   furosemide (LASIX) 40 MG tablet TAKE 1 TABLET BY MOUTH EVERY DAY (Patient taking differently: Take 40 mg by mouth daily as needed for edema. ) 90 tablet 1   KLOR-CON M10 10 MEQ tablet TAKE 1 TABLET BY MOUTH EVERY DAY (Patient taking differently: Take 10 mEq by mouth daily as needed (swelling). Take with lasix) 90 tablet 1   Liniments (SALONPAS PAIN RELIEF PATCH EX) Apply 1 patch topically daily as needed (pain).     losartan (COZAAR) 100 MG tablet Take 1 tablet (100 mg total) by mouth daily. 90 tablet 3   metoprolol succinate (TOPROL-XL) 100 MG 24 hr tablet Take 1 tablet (100 mg total) by mouth daily. Take with or immediately following a meal. 90 tablet 3   No current facility-administered medications on file prior to visit.     PAST  MEDICAL HISTORY: Past Medical History:  Diagnosis Date   ALLERGIC RHINITIS 06/01/2008   Anemia    Anxiety 08/15/2010   ASTHMA 06/01/2008   Asthma    BURSITIS, LEFT HIP 04/28/2010   GERD (gastroesophageal reflux disease)    GOITER, MULTINODULAR 06/28/2008   Hirsutism 06/02/2009   HYPERTENSION 06/01/2008   Palpitations 07/25/2010   PERIPHERAL EDEMA 04/28/2010   Pharyngitis, acute 10/09/2010   TONSILLITIS, RECURRENT 05/05/2009    PAST SURGICAL HISTORY: Past Surgical History:  Procedure Laterality Date   bilateral turbiante reductions     CESAREAN SECTION  2007   DILATATION & CURETTAGE/HYSTEROSCOPY WITH MYOSURE N/A 07/22/2018   Procedure: DILATATION & CURETTAGE/HYSTEROSCOPY WITH MYOSURE XL;  Surgeon: Servando Salina, MD;  Location: [facility];  Service: Gynecology;  Laterality: N/A;   s/p sinus surgury  2006    SOCIAL HISTORY: Social History   Tobacco Use   Smoking status: Never Smoker   Smokeless tobacco: Never Used  Substance Use Topics   Alcohol use: No   Drug  use: No    FAMILY HISTORY: Family History  Problem Relation Age of Onset   Hypertension Mother    Hyperlipidemia Mother    Hypertension Maternal Grandmother    Hyperlipidemia Maternal Grandmother    Hypertension Maternal Grandfather    Hyperlipidemia Maternal Grandfather    Lung cancer Maternal Grandfather     ROS: Review of Systems  Constitutional: Negative for weight loss.  Respiratory: Negative for shortness of breath.   Cardiovascular: Negative for chest pain.  Gastrointestinal: Negative for nausea and vomiting.  Musculoskeletal:       Negative muscle weakness    PHYSICAL EXAM: Pt in no acute distress  RECENT LABS AND TESTS: BMET    Component Value Date/Time   NA 136 07/17/2018 1429   NA 138 06/25/2018 1247   K 4.0 07/17/2018 1429   CL 104 07/17/2018 1429   CO2 23 07/17/2018 1429   GLUCOSE 88 07/17/2018 1429   BUN 17 07/17/2018 1429   BUN 10  06/25/2018 1247   CREATININE 0.74 07/17/2018 1429   CALCIUM 9.5 07/17/2018 1429   GFRNONAA >60 07/17/2018 1429   GFRAA >60 07/17/2018 1429   Lab Results  Component Value Date   HGBA1C 5.7 (H) 06/25/2018   Lab Results  Component Value Date   INSULIN 17.2 06/25/2018   CBC    Component Value Date/Time   WBC 5.6 07/17/2018 1429   RBC 4.58 07/17/2018 1429   HGB 14.7 07/17/2018 1429   HCT 44.4 07/17/2018 1429   PLT 198 07/17/2018 1429   MCV 96.9 07/17/2018 1429   MCH 32.1 07/17/2018 1429   MCHC 33.1 07/17/2018 1429   RDW 12.1 07/17/2018 1429   LYMPHSABS 2.0 10/11/2017 1516   MONOABS 0.7 10/11/2017 1516   EOSABS 0.3 10/11/2017 1516   BASOSABS 0.0 10/11/2017 1516   Iron/TIBC/Ferritin/ %Sat    Component Value Date/Time   IRON 153 (H) 05/17/2016 1555   IRONPCTSAT 45.5 05/17/2016 1555   Lipid Panel     Component Value Date/Time   CHOL 174 10/11/2017 1516   TRIG 98.0 10/11/2017 1516   HDL 48.30 10/11/2017 1516   CHOLHDL 4 10/11/2017 1516   VLDL 19.6 10/11/2017 1516   LDLCALC 106 (H) 10/11/2017 1516   Hepatic Function Panel     Component Value Date/Time   PROT 6.8 06/25/2018 1247   ALBUMIN 4.4 06/25/2018 1247   AST 11 06/25/2018 1247   ALT 15 06/25/2018 1247   ALKPHOS 51 06/25/2018 1247   BILITOT 0.4 06/25/2018 1247   BILIDIR 0.1 10/11/2017 1516      Component Value Date/Time   TSH 1.13 10/11/2017 1516   TSH 0.64 05/17/2016 1555   TSH 1.32 06/25/2014 1428      I, Trixie Dredge, am acting as Location manager for [employer], FNP-C.  I have reviewed the above documentation for accuracy and completeness, and I agree with the above.  - Sehaj Kolden, FNP-C.

## 2018-09-20 ENCOUNTER — Other Ambulatory Visit (INDEPENDENT_AMBULATORY_CARE_PROVIDER_SITE_OTHER): Payer: Self-pay | Admitting: Family Medicine

## 2018-09-20 DIAGNOSIS — E559 Vitamin D deficiency, unspecified: Secondary | ICD-10-CM

## 2018-09-23 ENCOUNTER — Ambulatory Visit (INDEPENDENT_AMBULATORY_CARE_PROVIDER_SITE_OTHER): Payer: 59 | Admitting: Family Medicine

## 2018-09-23 ENCOUNTER — Encounter (INDEPENDENT_AMBULATORY_CARE_PROVIDER_SITE_OTHER): Payer: Self-pay | Admitting: Family Medicine

## 2018-09-23 ENCOUNTER — Other Ambulatory Visit: Payer: Self-pay

## 2018-09-23 DIAGNOSIS — Z6841 Body Mass Index (BMI) 40.0 and over, adult: Secondary | ICD-10-CM

## 2018-09-23 DIAGNOSIS — E66813 Obesity, class 3: Secondary | ICD-10-CM

## 2018-09-23 NOTE — Progress Notes (Signed)
Erroneous encounter. Pt unable to complete telehealth visit due to family emergency.

## 2018-10-01 ENCOUNTER — Encounter (INDEPENDENT_AMBULATORY_CARE_PROVIDER_SITE_OTHER): Payer: Self-pay | Admitting: Family Medicine

## 2018-10-01 DIAGNOSIS — E559 Vitamin D deficiency, unspecified: Secondary | ICD-10-CM

## 2018-10-02 MED ORDER — VITAMIN D (ERGOCALCIFEROL) 1.25 MG (50000 UNIT) PO CAPS: 50000.0000 [IU] | ORAL_CAPSULE | ORAL | 0 refills | Status: DC

## 2018-10-04 ENCOUNTER — Other Ambulatory Visit: Payer: Self-pay | Admitting: Internal Medicine

## 2018-10-07 ENCOUNTER — Other Ambulatory Visit: Payer: Self-pay

## 2018-10-07 ENCOUNTER — Ambulatory Visit (INDEPENDENT_AMBULATORY_CARE_PROVIDER_SITE_OTHER): Payer: 59 | Admitting: Family Medicine

## 2018-10-07 DIAGNOSIS — I10 Essential (primary) hypertension: Secondary | ICD-10-CM

## 2018-10-07 DIAGNOSIS — Z6839 Body mass index (BMI) 39.0-39.9, adult: Secondary | ICD-10-CM | POA: Diagnosis not present

## 2018-10-07 DIAGNOSIS — R7303 Prediabetes: Secondary | ICD-10-CM

## 2018-10-07 MED ORDER — CHLORTHALIDONE 25 MG PO TABS: ORAL_TABLET | ORAL | 0 refills | Status: DC

## 2018-10-08 ENCOUNTER — Encounter (INDEPENDENT_AMBULATORY_CARE_PROVIDER_SITE_OTHER): Payer: Self-pay | Admitting: Family Medicine

## 2018-10-08 NOTE — Progress Notes (Signed)
Office: [phone]  /  Fax: [phone] TeleHealth Visit:  Laura [name] has verbally consented to this TeleHealth visit today. The patient is located at home, the provider is located at the [facility] office. The participants in this visit include the listed provider and patient. The visit was conducted today via FaceTime.  HPI:   Chief Complaint: OBESITY Laura [name] is here to discuss her progress with her obesity treatment plan. She is on the Category 3 plan + 100 calories with breakfast options and is following her eating plan approximately 70-75% of the time. She states she is walking 35 minutes 2 times per week. Laura [name]'s father recently passed away and she has not focused as much on the eating plan. She does report getting her protein in. We were unable to weigh the patient today for this TeleHealth visit. She feels as if she has maintained her weight since her last visit. She has lost 17 lbs since starting treatment with [place].  Hypertension Laura [name] is a 43 y.o. female with hypertension, which has been moderately well controlled at recent office visits. Laura [name] denies chest pain or shortness of breath on exertion. She is working weight loss to help control her blood pressure with the goal of decreasing her risk of heart attack and stroke. Laura [name] has not checked her blood pressure at home. BP Readings from Last 3 Encounters:  08/15/18 136/88  08/12/18 (!) 165/108  07/24/18 (!) 149/87    Pre-Diabetes Laura [name] has a diagnosis of prediabetes based on her elevated Hgb A1c and was informed this puts her at greater risk of developing diabetes. Her last A1c was 5.7 on 06/25/2018. She is not taking metformin currently and continues to work on diet and exercise to decrease risk of diabetes. She denies hypoglycemia.  Lab Results  Component Value Date   HGBA1C 5.7 (H) 06/25/2018    ASSESSMENT AND PLAN:  Essential hypertension - Plan:  chlorthalidone (HYGROTON) 25 MG tablet  Prediabetes  Class 2 severe obesity with serious comorbidity and body mass index (BMI) of 39.0 to 39.9 in adult, unspecified obesity type (Laura [name])  PLAN:  Hypertension We discussed sodium restriction, working on healthy weight loss, and a regular exercise program as the means to achieve improved blood pressure control. Latanyaagreed with this plan and agreed to follow-up as directed. We will continue to monitor her blood pressure as well as her progress with the above lifestyle modifications. Laura [name] was given a refill on her Chlorthalidone 25 mg (patient to take 1/2 tab) QD # 15 with 0 refills and agrees to follow-up with our clinic in 2-3 weeks. She will take her blood pressure a few times a week and and will watch for signs of hypotension as she continues her lifestyle modifications.  Pre-Diabetes Laura [name] will continue to work on weight loss, exercise, and decreasing simple carbohydrates in her diet to help decrease the risk of diabetes. Laura [name] will continue her meal plan and follow-up with [place] as directed to monitor her progress.  Obesity Laura [name] is currently in the action stage of change. As such, her goal is to continue with weight loss efforts. She has agreed to follow the Category 3 plan + 100 calories with breakfast options. Laura [name] will try to get back on the plan over the next few weeks and would like to come into the office for a visit. Laura [name] has been instructed to continue her current exercise regimen for weight loss and overall health benefits. We discussed the following Behavioral  Modification Strategies today: increasing lean protein intake and planning for success.  Laura [name] has agreed to follow-up with our clinic in 2-3 weeks. She was informed of the importance of frequent follow-up visits to maximize her success with intensive lifestyle modifications for her multiple health conditions.  ALLERGIES: No Known Allergies  MEDICATIONS:  Current Outpatient Medications on File Prior to Visit  Medication Sig Dispense Refill  . acetaminophen (TYLENOL) 325 MG tablet Take 325 mg by mouth every 6 (six) hours as needed for moderate pain or headache.    . albuterol (PROVENTIL HFA;VENTOLIN HFA) 108 (90 BASE) MCG/ACT inhaler Inhale 2 puffs into the lungs every 4 (four) hours as needed. (Patient taking differently: Inhale 2 puffs into the lungs every 4 (four) hours as needed for wheezing or shortness of breath. ) 3 Inhaler 3  . azelastine (OPTIVAR) 0.05 % ophthalmic solution Place 1 drop into both eyes 2 (two) times daily. (Patient taking differently: Place 1 drop into both eyes 2 (two) times daily as needed (dry eyes). ) 6 mL 12  . clotrimazole (MYCELEX) 10 MG troche Take 1 tablet (10 mg total) by mouth 5 (five) times daily. (Patient taking differently: Take 10 mg by mouth 5 (five) times daily as needed (sore throat). ) 180 tablet 1  . Ferrous Sulfate (FEOSOL PO) Take 1 tablet by mouth daily.    . fluticasone (FLONASE) 50 MCG/ACT nasal spray USE 2 SPRAYS IN BOTH NOSTRILS DAILY (Patient taking differently: Place 2 sprays into both nostrils daily as needed for allergies. ) 16 g 2  . furosemide (LASIX) 40 MG tablet TAKE 1 TABLET BY MOUTH EVERY DAY 90 tablet 1  . Liniments (SALONPAS PAIN RELIEF PATCH EX) Apply 1 patch topically daily as needed (pain).    Marland Kitchen losartan (COZAAR) 100 MG tablet Take 1 tablet (100 mg total) by mouth daily. 90 tablet 3  . metoprolol succinate (TOPROL-XL) 100 MG 24 hr tablet Take 1 tablet (100 mg total) by mouth daily. Take with or immediately following a meal. 90 tablet 3  . potassium chloride (KLOR-CON M10) 10 MEQ tablet Take 1 tablet (10 mEq total) by mouth daily as needed (swelling). Take with lasix 90 tablet 1  . Vitamin D, Ergocalciferol, (DRISDOL) 1.25 MG (50000 UT) CAPS capsule Take 1 capsule (50,000 Units total) by mouth every 7 (seven) days. 4 capsule 0   No current facility-administered medications on file prior  to visit.     PAST MEDICAL HISTORY: Past Medical History:  Diagnosis Date  . ALLERGIC RHINITIS 06/01/2008  . Anemia   . Anxiety 08/15/2010  . ASTHMA 06/01/2008  . Asthma   . BURSITIS, LEFT HIP 04/28/2010  . GERD (gastroesophageal reflux disease)   . GOITER, MULTINODULAR 06/28/2008  . Hirsutism 06/02/2009  . HYPERTENSION 06/01/2008  . Palpitations 07/25/2010  . PERIPHERAL EDEMA 04/28/2010  . Pharyngitis, acute 10/09/2010  . TONSILLITIS, RECURRENT 05/05/2009    PAST SURGICAL HISTORY: Past Surgical History:  Procedure Laterality Date  . bilateral turbiante reductions    . CESAREAN SECTION  2007  . DILATATION & CURETTAGE/HYSTEROSCOPY WITH MYOSURE N/A 07/22/2018   Procedure: DILATATION & CURETTAGE/HYSTEROSCOPY WITH MYOSURE XL;  Surgeon: Servando Salina, MD;  Location: [facility];  Service: Gynecology;  Laterality: N/A;  . s/p sinus surgury  2006    SOCIAL HISTORY: Social History   Tobacco Use  . Smoking status: Never Smoker  . Smokeless tobacco: Never Used  Substance Use Topics  . Alcohol use: No  . Drug use: No  FAMILY HISTORY: Family History  Problem Relation Age of Onset  . Hypertension Mother   . Hyperlipidemia Mother   . Hypertension Maternal Grandmother   . Hyperlipidemia Maternal Grandmother   . Hypertension Maternal Grandfather   . Hyperlipidemia Maternal Grandfather   . Lung cancer Maternal Grandfather    ROS: Review of Systems  Respiratory: Negative for shortness of breath.   Cardiovascular: Negative for chest pain.  Endo/Heme/Allergies:       Negative for hypoglycemia.   PHYSICAL EXAM: Pt in no acute distress  RECENT LABS AND TESTS: BMET    Component Value Date/Time   NA 136 07/17/2018 1429   NA 138 06/25/2018 1247   K 4.0 07/17/2018 1429   CL 104 07/17/2018 1429   CO2 23 07/17/2018 1429   GLUCOSE 88 07/17/2018 1429   BUN 17 07/17/2018 1429   BUN 10 06/25/2018 1247   CREATININE 0.74 07/17/2018 1429   CALCIUM 9.5 07/17/2018 1429    GFRNONAA >60 07/17/2018 1429   GFRAA >60 07/17/2018 1429   Lab Results  Component Value Date   HGBA1C 5.7 (H) 06/25/2018   Lab Results  Component Value Date   INSULIN 17.2 06/25/2018   CBC    Component Value Date/Time   WBC 5.6 07/17/2018 1429   RBC 4.58 07/17/2018 1429   HGB 14.7 07/17/2018 1429   HCT 44.4 07/17/2018 1429   PLT 198 07/17/2018 1429   MCV 96.9 07/17/2018 1429   MCH 32.1 07/17/2018 1429   MCHC 33.1 07/17/2018 1429   RDW 12.1 07/17/2018 1429   LYMPHSABS 2.0 10/11/2017 1516   MONOABS 0.7 10/11/2017 1516   EOSABS 0.3 10/11/2017 1516   BASOSABS 0.0 10/11/2017 1516   Iron/TIBC/Ferritin/ %Sat    Component Value Date/Time   IRON 153 (H) 05/17/2016 1555   IRONPCTSAT 45.5 05/17/2016 1555   Lipid Panel     Component Value Date/Time   CHOL 174 10/11/2017 1516   TRIG 98.0 10/11/2017 1516   HDL 48.30 10/11/2017 1516   CHOLHDL 4 10/11/2017 1516   VLDL 19.6 10/11/2017 1516   LDLCALC 106 (H) 10/11/2017 1516   Hepatic Function Panel     Component Value Date/Time   PROT 6.8 06/25/2018 1247   ALBUMIN 4.4 06/25/2018 1247   AST 11 06/25/2018 1247   ALT 15 06/25/2018 1247   ALKPHOS 51 06/25/2018 1247   BILITOT 0.4 06/25/2018 1247   BILIDIR 0.1 10/11/2017 1516      Component Value Date/Time   TSH 1.13 10/11/2017 1516   TSH 0.64 05/17/2016 1555   TSH 1.32 06/25/2014 1428   Results for [place], GWENNETH WHITEMAN (MRN [id number]) as of 10/08/2018 09:49  Ref. Range 06/25/2018 12:47  Vitamin D, 25-Hydroxy Latest Ref Range: 30.0 - 100.0 ng/mL 15.6 (L)   I, Michaelene Song, am acting as Location manager for [employer], FNP-C.  I have reviewed the above documentation for accuracy and completeness, and I agree with the above.  - Dantrell Schertzer, FNP-C.

## 2018-10-16 ENCOUNTER — Encounter: Payer: Managed Care, Other (non HMO) | Admitting: Internal Medicine

## 2018-10-22 ENCOUNTER — Ambulatory Visit (INDEPENDENT_AMBULATORY_CARE_PROVIDER_SITE_OTHER): Payer: 59 | Admitting: Family Medicine

## 2018-10-22 ENCOUNTER — Encounter (INDEPENDENT_AMBULATORY_CARE_PROVIDER_SITE_OTHER): Payer: Self-pay

## 2019-05-12 ENCOUNTER — Other Ambulatory Visit: Payer: Self-pay | Admitting: Internal Medicine

## 2019-08-13 ENCOUNTER — Other Ambulatory Visit: Payer: Self-pay | Admitting: Internal Medicine

## 2019-08-13 NOTE — Telephone Encounter (Signed)
Please refill as per office routine med refill policy (all routine meds refilled for 3 mo or monthly per pt preference up to one year from last visit, then month to month grace period for 3 mo, then further med refills will have to be denied)  

## 2019-09-27 ENCOUNTER — Other Ambulatory Visit: Payer: Self-pay | Admitting: Internal Medicine

## 2019-09-27 NOTE — Telephone Encounter (Signed)
Please refill as per office routine med refill policy (all routine meds refilled for 3 mo or monthly per pt preference up to one year from last visit, then month to month grace period for 3 mo, then further med refills will have to be denied)  

## 2019-12-28 ENCOUNTER — Other Ambulatory Visit: Payer: Self-pay | Admitting: Internal Medicine

## 2019-12-28 NOTE — Telephone Encounter (Signed)
Please refill as per office routine med refill policy (all routine meds refilled for 3 mo or monthly per pt preference up to one year from last visit, then month to month grace period for 3 mo, then further med refills will have to be denied)  

## 2020-03-25 ENCOUNTER — Telehealth: Payer: Self-pay | Admitting: Internal Medicine

## 2020-03-25 NOTE — Telephone Encounter (Signed)
losartan (COZAAR) 100 MG tablet Requesting a refill next apt 11.1.21 [id number] - [place], [place] - [address] AT [address] Phone:  [phone]  Fax:  [phone]

## 2020-03-26 ENCOUNTER — Other Ambulatory Visit: Payer: Self-pay | Admitting: Internal Medicine

## 2020-03-26 NOTE — Telephone Encounter (Signed)
Please refill as per office routine med refill policy (all routine meds refilled for 3 mo or monthly per pt preference up to one year from last visit, then month to month grace period for 3 mo, then further med refills will have to be denied)  

## 2020-03-28 ENCOUNTER — Ambulatory Visit: Payer: BC Managed Care – PPO | Admitting: Internal Medicine

## 2020-03-28 ENCOUNTER — Encounter: Payer: Self-pay | Admitting: Internal Medicine

## 2020-03-28 ENCOUNTER — Other Ambulatory Visit: Payer: Self-pay

## 2020-03-28 ENCOUNTER — Other Ambulatory Visit (INDEPENDENT_AMBULATORY_CARE_PROVIDER_SITE_OTHER): Payer: BC Managed Care – PPO

## 2020-03-28 VITALS — BP 138/102 | HR 74 | Temp 98.2°F | Ht 60.0 in | Wt 229.0 lb

## 2020-03-28 DIAGNOSIS — E538 Deficiency of other specified B group vitamins: Secondary | ICD-10-CM | POA: Diagnosis not present

## 2020-03-28 DIAGNOSIS — D509 Iron deficiency anemia, unspecified: Secondary | ICD-10-CM

## 2020-03-28 DIAGNOSIS — Z0000 Encounter for general adult medical examination without abnormal findings: Secondary | ICD-10-CM

## 2020-03-28 DIAGNOSIS — Z23 Encounter for immunization: Secondary | ICD-10-CM | POA: Diagnosis not present

## 2020-03-28 DIAGNOSIS — Z1159 Encounter for screening for other viral diseases: Secondary | ICD-10-CM

## 2020-03-28 DIAGNOSIS — E559 Vitamin D deficiency, unspecified: Secondary | ICD-10-CM

## 2020-03-28 DIAGNOSIS — R351 Nocturia: Secondary | ICD-10-CM

## 2020-03-28 DIAGNOSIS — I10 Essential (primary) hypertension: Secondary | ICD-10-CM

## 2020-03-28 DIAGNOSIS — R7303 Prediabetes: Secondary | ICD-10-CM

## 2020-03-28 DIAGNOSIS — I872 Venous insufficiency (chronic) (peripheral): Secondary | ICD-10-CM | POA: Insufficient documentation

## 2020-03-28 LAB — HEPATIC FUNCTION PANEL
ALT: 21 U/L (ref 0–35)
AST: 16 U/L (ref 0–37)
Albumin: 4.2 g/dL (ref 3.5–5.2)
Alkaline Phosphatase: 52 U/L (ref 39–117)
Bilirubin, Direct: 0.1 mg/dL (ref 0.0–0.3)
Total Bilirubin: 0.4 mg/dL (ref 0.2–1.2)
Total Protein: 7.2 g/dL (ref 6.0–8.3)

## 2020-03-28 LAB — IBC PANEL
Iron: 73 ug/dL (ref 42–145)
Saturation Ratios: 25.3 % (ref 20.0–50.0)
Transferrin: 206 mg/dL — ABNORMAL LOW (ref 212.0–360.0)

## 2020-03-28 LAB — LIPID PANEL
Cholesterol: 155 mg/dL (ref 0–200)
HDL: 49.5 mg/dL (ref >39.00)
LDL Cholesterol: 87 mg/dL (ref 0–99)
NonHDL: 105.06
Total CHOL/HDL Ratio: 3
Triglycerides: 88 mg/dL (ref 0.0–149.0)
VLDL: 17.6 mg/dL (ref 0.0–40.0)

## 2020-03-28 MED ORDER — LOSARTAN POTASSIUM 100 MG PO TABS: 100.0000 mg | ORAL_TABLET | Freq: Every day | ORAL | 0 refills | Status: DC

## 2020-03-28 NOTE — Assessment & Plan Note (Signed)
Mild, for low salt, wt loss, leg elevation, compression stockings

## 2020-03-28 NOTE — Assessment & Plan Note (Signed)

## 2020-03-28 NOTE — Assessment & Plan Note (Signed)
To continue vit d 2000 u qd

## 2020-03-28 NOTE — Assessment & Plan Note (Signed)
No overt bleeding, for lab f/u

## 2020-03-28 NOTE — Patient Instructions (Signed)
You had the flu shot today  Please continue all other medications as before, and refills have been done if requested.  Please have the pharmacy call with any other refills you may need.  Please continue your efforts at being more active, low cholesterol diet, and weight control.  You are otherwise up to date with prevention measures today.  Please keep your appointments with your specialists as you may have planned  You will be contacted regarding the referral for: bariatric surgury  Please go to the LAB at the blood drawing area for the tests to be done at the [facility] lab at your convenience  You will be contacted by phone if any changes need to be made immediately.  Otherwise, you will receive a letter about your results with an explanation, but please check with MyChart first.  Please remember to sign up for MyChart if you have not done so, as this will be important to you in the future with finding out test results, communicating by private email, and scheduling acute appointments online when needed.  Please make an Appointment to return in 6 months, or sooner if needed

## 2020-03-28 NOTE — Progress Notes (Signed)
Subjective:    Patient ID: Laura [name], female    DOB: Mar 17, 1976, 44 y.o.   MRN: [id number]  HPI Here for wellness and f/u;  Overall doing ok;  Pt denies Chest pain, worsening SOB, DOE, wheezing, orthopnea, PND, palpitations, dizziness or syncope. But does have chronic right > left venous insufficiency swelling better in the AM, worse in the PM.  Pt denies neurological change such as new headache, facial or extremity weakness.  Pt denies polydipsia, polyuria, or low sugar symptoms. Pt states overall good compliance with treatment and medications, good tolerability, and has been trying to follow appropriate diet.  Pt denies worsening depressive symptoms, suicidal ideation or panic. No fever, night sweats, wt loss, loss of appetite, or other constitutional symptoms.  Pt states good ability with ADL's, has low fall risk, home safety reviewed and adequate, no other significant changes in hearing or vision, and only occasionally active with exercise.  Tolerating Vit D 2000u daily.  Also has worsening nocturia now up 8 times per night for unclear reasons, asks for urine studies with lab, and consider need for OAB med or urology.  Also, would like referral to Bariatric surgury as she has tried many times at diet and excerise unable to lose wt. Past Medical History:  Diagnosis Date  . ALLERGIC RHINITIS 06/01/2008  . Anemia   . Anxiety 08/15/2010  . ASTHMA 06/01/2008  . Asthma   . BURSITIS, LEFT HIP 04/28/2010  . GERD (gastroesophageal reflux disease)   . GOITER, MULTINODULAR 06/28/2008  . Hirsutism 06/02/2009  . HYPERTENSION 06/01/2008  . Palpitations 07/25/2010  . PERIPHERAL EDEMA 04/28/2010  . Pharyngitis, acute 10/09/2010  . TONSILLITIS, RECURRENT 05/05/2009   Past Surgical History:  Procedure Laterality Date  . bilateral turbiante reductions    . CESAREAN SECTION  2007  . DILATATION & CURETTAGE/HYSTEROSCOPY WITH MYOSURE N/A 07/22/2018   Procedure: DILATATION & CURETTAGE/HYSTEROSCOPY WITH MYOSURE  XL;  Surgeon: Servando Salina, MD;  Location: [facility];  Service: Gynecology;  Laterality: N/A;  . s/p sinus surgury  2006    reports that she has never smoked. She has never used smokeless tobacco. She reports that she does not drink alcohol and does not use drugs. family history includes Hyperlipidemia in her maternal grandfather, maternal grandmother, and mother; Hypertension in her maternal grandfather, maternal grandmother, and mother; Lung cancer in her maternal grandfather. No Known Allergies Current Outpatient Medications on File Prior to Visit  Medication Sig Dispense Refill  . acetaminophen (TYLENOL) 325 MG tablet Take 325 mg by mouth every 6 (six) hours as needed for moderate pain or headache.    . albuterol (PROVENTIL HFA;VENTOLIN HFA) 108 (90 BASE) MCG/ACT inhaler Inhale 2 puffs into the lungs every 4 (four) hours as needed. (Patient taking differently: Inhale 2 puffs into the lungs every 4 (four) hours as needed for wheezing or shortness of breath. ) 3 Inhaler 3  . azelastine (OPTIVAR) 0.05 % ophthalmic solution Place 1 drop into both eyes 2 (two) times daily. (Patient taking differently: Place 1 drop into both eyes 2 (two) times daily as needed (dry eyes). ) 6 mL 12  . chlorthalidone (HYGROTON) 25 MG tablet Take 1/2 tab daily 15 tablet 0  . clotrimazole (MYCELEX) 10 MG troche Take 1 tablet (10 mg total) by mouth 5 (five) times daily. (Patient taking differently: Take 10 mg by mouth 5 (five) times daily as needed (sore throat). ) 180 tablet 1  . Ferrous Sulfate (FEOSOL PO) Take 1 tablet by mouth  daily.    . fluticasone (FLONASE) 50 MCG/ACT nasal spray USE 2 SPRAYS IN BOTH NOSTRILS DAILY (Patient taking differently: Place 2 sprays into both nostrils daily as needed for allergies. ) 16 g 2  . furosemide (LASIX) 40 MG tablet TAKE 1 TABLET BY MOUTH EVERY DAY 90 tablet 1  . KLOR-CON M10 10 MEQ tablet TAKE 1 TABLET (10 MEQ TOTAL) BY MOUTH DAILY AS NEEDED (SWELLING). TAKE  WITH LASIX 90 tablet 1  . Liniments (SALONPAS PAIN RELIEF PATCH EX) Apply 1 patch topically daily as needed (pain).    Marland Kitchen losartan (COZAAR) 100 MG tablet Take 1 tablet (100 mg total) by mouth daily. 90 tablet 0  . metoprolol succinate (TOPROL-XL) 100 MG 24 hr tablet TAKE 1 TABLET (100 MG TOTAL) BY MOUTH DAILY. TAKE WITH OR IMMEDIATELY FOLLOWING A MEAL. 90 tablet 3  . Vitamin D, Ergocalciferol, (DRISDOL) 1.25 MG (50000 UT) CAPS capsule Take 1 capsule (50,000 Units total) by mouth every 7 (seven) days. 4 capsule 0   No current facility-administered medications on file prior to visit.   Review of Systems All otherwise neg per pt    Objective:   Physical Exam BP (!) 138/102 (BP Location: Left Arm, Patient Position: Sitting, Cuff Size: Large)   Pulse 74   Temp 98.2 F (36.8 C) (Oral)   Ht 5' (1.524 m)   Wt 229 lb (103.9 kg)   SpO2 97%   BMI 44.72 kg/m  VS noted,  Constitutional: Pt appears in NAD HENT: Head: NCAT.  Right Ear: External ear normal.  Left Ear: External ear normal.  Eyes: . Pupils are equal, round, and reactive to light. Conjunctivae and EOM are normal Nose: without d/c or deformity Neck: Neck supple. Gross normal ROM Cardiovascular: Normal rate and regular rhythm.   Pulmonary/Chest: Effort normal and breath sounds without rales or wheezing.  Abd:  Soft, NT, ND, + BS, no organomegaly Neurological: Pt is alert. At baseline orientation, motor grossly intact Skin: Skin is warm. No rashes, other new lesions, trace to 1+ bilat LE edema chronic right > left Psychiatric: Pt behavior is normal without agitation  All otherwise neg per pt Lab Results  Component Value Date   WBC 5.6 07/17/2018   HGB 14.7 07/17/2018   HCT 44.4 07/17/2018   PLT 198 07/17/2018   GLUCOSE 88 07/17/2018   CHOL 174 10/11/2017   TRIG 98.0 10/11/2017   HDL 48.30 10/11/2017   LDLCALC 106 (H) 10/11/2017   ALT 15 06/25/2018   AST 11 06/25/2018   NA 136 07/17/2018   K 4.0 07/17/2018   CL 104  07/17/2018   CREATININE 0.74 07/17/2018   BUN 17 07/17/2018   CO2 23 07/17/2018   TSH 1.13 10/11/2017   HGBA1C 5.7 (H) 06/25/2018      Assessment & Plan:

## 2020-03-28 NOTE — Assessment & Plan Note (Addendum)
For urine studies, consider OAB med trial  I spent 31 minutes in addition to time for CPX wellness examination in preparing to see the patient by review of recent labs, imaging and procedures, obtaining and reviewing separately obtained history, communicating with the patient and family or caregiver, ordering medications, tests or procedures, and documenting clinical information in the EHR including the differential Dx, treatment, and any further evaluation and other management of nocturia, preDM, venous insufficiency, vit d def, iron def anemia, morbid obesity

## 2020-03-28 NOTE — Assessment & Plan Note (Signed)
stable overall by history and exam, recent data reviewed with pt, and pt to continue medical treatment as before,  to f/u any worsening symptoms or concerns  

## 2020-03-28 NOTE — Assessment & Plan Note (Signed)
[facility] for general surgury referral

## 2020-03-29 ENCOUNTER — Encounter: Payer: Self-pay | Admitting: Internal Medicine

## 2020-03-29 ENCOUNTER — Other Ambulatory Visit: Payer: Self-pay | Admitting: Internal Medicine

## 2020-03-29 DIAGNOSIS — E559 Vitamin D deficiency, unspecified: Secondary | ICD-10-CM

## 2020-03-29 LAB — URINALYSIS, ROUTINE W REFLEX MICROSCOPIC
Bilirubin Urine: NEGATIVE
Leukocytes,Ua: NEGATIVE
Nitrite: NEGATIVE
Specific Gravity, Urine: 1.025 (ref 1.000–1.030)
Total Protein, Urine: NEGATIVE
Urine Glucose: NEGATIVE
Urobilinogen, UA: 0.2 (ref 0.0–1.0)
pH: 6 (ref 5.0–8.0)

## 2020-03-29 LAB — CBC WITH DIFFERENTIAL/PLATELET
Basophils Absolute: 0.1 10*3/uL (ref 0.0–0.1)
Basophils Relative: 1.2 % (ref 0.0–3.0)
Eosinophils Absolute: 0.2 10*3/uL (ref 0.0–0.7)
Eosinophils Relative: 3.1 % (ref 0.0–5.0)
HCT: 42.2 % (ref 36.0–46.0)
Hemoglobin: 14.4 g/dL (ref 12.0–15.0)
Lymphocytes Relative: 26.1 % (ref 12.0–46.0)
Lymphs Abs: 1.6 10*3/uL (ref 0.7–4.0)
MCHC: 34.2 g/dL (ref 30.0–36.0)
MCV: 93.6 fl (ref 78.0–100.0)
Monocytes Absolute: 0.6 10*3/uL (ref 0.1–1.0)
Monocytes Relative: 9.9 % (ref 3.0–12.0)
Neutro Abs: 3.7 10*3/uL (ref 1.4–7.7)
Neutrophils Relative %: 59.7 % (ref 43.0–77.0)
Platelets: 224 10*3/uL (ref 150.0–400.0)
RBC: 4.51 Mil/uL (ref 3.87–5.11)
RDW: 13 % (ref 11.5–15.5)
WBC: 6.2 10*3/uL (ref 4.0–10.5)

## 2020-03-29 LAB — TSH: TSH: 1.29 u[IU]/mL (ref 0.35–4.50)

## 2020-03-29 LAB — VITAMIN D 25 HYDROXY (VIT D DEFICIENCY, FRACTURES): VITD: 19.82 ng/mL — ABNORMAL LOW (ref 30.00–100.00)

## 2020-03-29 LAB — HEMOGLOBIN A1C: Hgb A1c MFr Bld: 6.4 % (ref 4.6–6.5)

## 2020-03-29 LAB — VITAMIN B12: Vitamin B-12: 438 pg/mL (ref 211–911)

## 2020-03-29 MED ORDER — VITAMIN D (ERGOCALCIFEROL) 1.25 MG (50000 UNIT) PO CAPS: 50000.0000 [IU] | ORAL_CAPSULE | ORAL | 0 refills | Status: DC

## 2020-03-30 ENCOUNTER — Other Ambulatory Visit: Payer: Self-pay | Admitting: Internal Medicine

## 2020-03-30 ENCOUNTER — Encounter: Payer: Self-pay | Admitting: Internal Medicine

## 2020-03-30 LAB — HEPATITIS C ANTIBODY
Hepatitis C Ab: NONREACTIVE
SIGNAL TO CUT-OFF: 0.01 (ref <1.00)

## 2020-03-30 LAB — URINE CULTURE
MICRO NUMBER:: 11142989
SPECIMEN QUALITY:: ADEQUATE

## 2020-03-30 MED ORDER — CIPROFLOXACIN HCL 500 MG PO TABS: 500.0000 mg | ORAL_TABLET | Freq: Two times a day (BID) | ORAL | 0 refills | Status: AC

## 2020-05-13 ENCOUNTER — Other Ambulatory Visit: Payer: Self-pay | Admitting: Internal Medicine

## 2020-05-13 NOTE — Telephone Encounter (Signed)
Please refill as per office routine med refill policy (all routine meds refilled for 3 mo or monthly per pt preference up to one year from last visit, then month to month grace period for 3 mo, then further med refills will have to be denied)  

## 2020-06-14 ENCOUNTER — Other Ambulatory Visit: Payer: Self-pay | Admitting: Internal Medicine

## 2020-06-14 DIAGNOSIS — E559 Vitamin D deficiency, unspecified: Secondary | ICD-10-CM

## 2020-06-14 NOTE — Telephone Encounter (Signed)
Please to change to OTC VIt D3 at 2000 units per day indefinitely - ok to let pt know

## 2020-06-28 ENCOUNTER — Other Ambulatory Visit: Payer: Self-pay | Admitting: Internal Medicine

## 2020-07-13 NOTE — Telephone Encounter (Signed)
Please refill as per office routine med refill policy (all routine meds refilled for 3 mo or monthly per pt preference up to one year from last visit, then month to month grace period for 3 mo, then further med refills will have to be denied)  

## 2020-07-13 NOTE — Telephone Encounter (Signed)
Possible duplicate 

## 2020-07-14 MED ORDER — LOSARTAN POTASSIUM 100 MG PO TABS
100.0000 mg | ORAL_TABLET | Freq: Every day | ORAL | 0 refills | Status: DC
Start: 2020-07-14 — End: 2020-12-30

## 2020-08-07 ENCOUNTER — Other Ambulatory Visit: Payer: Self-pay | Admitting: Internal Medicine

## 2020-08-07 NOTE — Telephone Encounter (Signed)
Please refill as per office routine med refill policy (all routine meds refilled for 3 mo or monthly per pt preference up to one year from last visit, then month to month grace period for 3 mo, then further med refills will have to be denied)  

## 2020-08-11 DIAGNOSIS — Z1231 Encounter for screening mammogram for malignant neoplasm of breast: Secondary | ICD-10-CM | POA: Diagnosis not present

## 2020-08-11 DIAGNOSIS — R350 Frequency of micturition: Secondary | ICD-10-CM | POA: Diagnosis not present

## 2020-08-11 DIAGNOSIS — Z01419 Encounter for gynecological examination (general) (routine) without abnormal findings: Secondary | ICD-10-CM | POA: Diagnosis not present

## 2020-08-12 ENCOUNTER — Other Ambulatory Visit: Payer: Self-pay

## 2020-08-12 ENCOUNTER — Ambulatory Visit: Payer: BC Managed Care – PPO | Admitting: Internal Medicine

## 2020-08-12 ENCOUNTER — Encounter: Payer: Self-pay | Admitting: Internal Medicine

## 2020-08-12 VITALS — BP 122/70 | HR 83 | Temp 98.1°F | Ht 60.0 in | Wt 208.0 lb

## 2020-08-12 DIAGNOSIS — R7303 Prediabetes: Secondary | ICD-10-CM | POA: Diagnosis not present

## 2020-08-12 DIAGNOSIS — E559 Vitamin D deficiency, unspecified: Secondary | ICD-10-CM

## 2020-08-12 DIAGNOSIS — I10 Essential (primary) hypertension: Secondary | ICD-10-CM

## 2020-08-12 DIAGNOSIS — R351 Nocturia: Secondary | ICD-10-CM | POA: Diagnosis not present

## 2020-08-12 DIAGNOSIS — E049 Nontoxic goiter, unspecified: Secondary | ICD-10-CM | POA: Insufficient documentation

## 2020-08-12 LAB — URINALYSIS, ROUTINE W REFLEX MICROSCOPIC
Bilirubin Urine: NEGATIVE
Ketones, ur: NEGATIVE
Leukocytes,Ua: NEGATIVE
Nitrite: NEGATIVE
Specific Gravity, Urine: 1.025 (ref 1.000–1.030)
Total Protein, Urine: NEGATIVE
Urine Glucose: NEGATIVE
Urobilinogen, UA: 0.2 (ref 0.0–1.0)
pH: 6.5 (ref 5.0–8.0)

## 2020-08-12 LAB — BASIC METABOLIC PANEL
BUN: 11 mg/dL (ref 6–23)
CO2: 26 mEq/L (ref 19–32)
Calcium: 9.2 mg/dL (ref 8.4–10.5)
Chloride: 104 mEq/L (ref 96–112)
Creatinine, Ser: 0.66 mg/dL (ref 0.40–1.20)
GFR: 105.99 mL/min (ref >60.00)
Glucose, Bld: 91 mg/dL (ref 70–99)
Potassium: 3.9 mEq/L (ref 3.5–5.1)
Sodium: 137 mEq/L (ref 135–145)

## 2020-08-12 LAB — HEMOGLOBIN A1C: Hgb A1c MFr Bld: 5.6 % (ref 4.6–6.5)

## 2020-08-12 LAB — VITAMIN D 25 HYDROXY (VIT D DEFICIENCY, FRACTURES): VITD: 28.45 ng/mL — ABNORMAL LOW (ref 30.00–100.00)

## 2020-08-12 MED ORDER — SOLIFENACIN SUCCINATE 5 MG PO TABS: 5.0000 mg | ORAL_TABLET | Freq: Every day | ORAL | 3 refills | Status: DC

## 2020-08-12 NOTE — Progress Notes (Signed)
Patient ID: Laura [name], female   DOB: 1975/11/09, 45 y.o.   MRN: [id number]        Chief Complaint: f/u htn, low vit d, hyperglycemia but also urinary symptoms       HPI:  Laura [name] is a 45 y.o. female here with c/o persistent 2 mo onset urinary frequency and nocturia literally up to 10 times per night, seesm to be getting worse overall, cant sleep every night now and much daytime fatigue;  O/w has little symptoms - Denies urinary symptoms such as dysuria, urgency, flank pain, hematuria or n/v, fever, chills.  Not taking Vit D,  Pt denies polydipsia, polyuria, Denies other neuro focal s/s.   Pt denies fever, wt loss, night sweats, loss of appetite, or other constitutional symptoms  Denies worsening depressive symptoms, suicidal ideation, or panic; Lost 20 lbs with better diet and quite happy wit this.  Bp has been improved recently, was 120/88 yesterday at GYN appt. Wt Readings from Last 3 Encounters:  08/12/20 208 lb (94.3 kg)  03/28/20 229 lb (103.9 kg)  08/15/18 205 lb (93 kg)   BP Readings from Last 3 Encounters:  08/12/20 122/70  03/28/20 (!) 138/102  08/15/18 136/88         Past Medical History:  Diagnosis Date  . ALLERGIC RHINITIS 06/01/2008  . Anemia   . Anxiety 08/15/2010  . ASTHMA 06/01/2008  . Asthma   . BURSITIS, LEFT HIP 04/28/2010  . GERD (gastroesophageal reflux disease)   . GOITER, MULTINODULAR 06/28/2008  . Hirsutism 06/02/2009  . HYPERTENSION 06/01/2008  . Palpitations 07/25/2010  . PERIPHERAL EDEMA 04/28/2010  . Pharyngitis, acute 10/09/2010  . TONSILLITIS, RECURRENT 05/05/2009   Past Surgical History:  Procedure Laterality Date  . bilateral turbiante reductions    . CESAREAN SECTION  2007  . DILATATION & CURETTAGE/HYSTEROSCOPY WITH MYOSURE N/A 07/22/2018   Procedure: DILATATION & CURETTAGE/HYSTEROSCOPY WITH MYOSURE XL;  Surgeon: Servando Salina, MD;  Location: [facility];  Service: Gynecology;  Laterality: N/A;  . s/p sinus surgury   2006    reports that she has never smoked. She has never used smokeless tobacco. She reports that she does not drink alcohol and does not use drugs. family history includes Hyperlipidemia in her maternal grandfather, maternal grandmother, and mother; Hypertension in her maternal grandfather, maternal grandmother, and mother; Lung cancer in her maternal grandfather. No Known Allergies Current Outpatient Medications on File Prior to Visit  Medication Sig Dispense Refill  . Fe Bisgly-Succ-C-Thre-B12-FA (IRON 21/7 PO)     . Ferrous Sulfate (FEOSOL PO) Take 1 tablet by mouth daily.    . fluticasone (FLONASE) 50 MCG/ACT nasal spray USE 2 SPRAYS IN BOTH NOSTRILS DAILY (Patient taking differently: Place 2 sprays into both nostrils daily as needed for allergies.) 16 g 2  . Liniments (SALONPAS PAIN RELIEF PATCH EX) Apply 1 patch topically daily as needed (pain).    Marland Kitchen losartan (COZAAR) 100 MG tablet Take 1 tablet (100 mg total) by mouth daily. 90 tablet 0  . metoprolol succinate (TOPROL-XL) 100 MG 24 hr tablet TAKE 1 TABLET (100 MG TOTAL) BY MOUTH DAILY. TAKE WITH OR IMMEDIATELY FOLLOWING A MEAL. 90 tablet 2  . acetaminophen (TYLENOL) 325 MG tablet Take 325 mg by mouth every 6 (six) hours as needed for moderate pain or headache. (Patient not taking: Reported on 08/12/2020)    . albuterol (PROVENTIL HFA;VENTOLIN HFA) 108 (90 BASE) MCG/ACT inhaler Inhale 2 puffs into the lungs every 4 (four) hours as  needed. (Patient not taking: Reported on 08/12/2020) 3 Inhaler 3  . fluticasone (FLOVENT DISKUS) 50 MCG/BLIST diskus inhaler 1 spray in each nostril (Patient not taking: Reported on 08/12/2020)    . furosemide (LASIX) 40 MG tablet TAKE 1 TABLET BY MOUTH EVERY DAY (Patient not taking: Reported on 08/12/2020) 90 tablet 1  . KLOR-CON M10 10 MEQ tablet TAKE 1 TABLET (10 MEQ TOTAL) BY MOUTH DAILY AS NEEDED (SWELLING). TAKE WITH LASIX (Patient not taking: Reported on 08/12/2020) 90 tablet 1  . labetalol (NORMODYNE) 300 MG  tablet labetalol 300 mg tablet (Patient not taking: Reported on 08/12/2020)    . Vitamin D, Ergocalciferol, (DRISDOL) 1.25 MG (50000 UNIT) CAPS capsule Take 1 capsule (50,000 Units total) by mouth every 7 (seven) days. (Patient not taking: Reported on 08/12/2020) 12 capsule 0   No current facility-administered medications on file prior to visit.        ROS:  All others reviewed and negative.  Objective        PE:  BP 122/70   Pulse 83   Temp 98.1 F (36.7 C) (Oral)   Ht 5' (1.524 m)   Wt 208 lb (94.3 kg)   SpO2 98%   BMI 40.62 kg/m                 Constitutional: Pt appears in NAD               HENT: Head: NCAT.                Right Ear: External ear normal.                 Left Ear: External ear normal.                Eyes: . Pupils are equal, round, and reactive to light. Conjunctivae and EOM are normal               Nose: without d/c or deformity               Neck: Neck supple. Gross normal ROM               Cardiovascular: Normal rate and regular rhythm.                 Pulmonary/Chest: Effort normal and breath sounds without rales or wheezing.                Abd:  Soft, NT, ND, + BS, no organomegaly               Neurological: Pt is alert. At baseline orientation, motor grossly intact               Skin: Skin is warm. No rashes, no other new lesions, LE edema - none               Psychiatric: Pt behavior is normal without agitation   Micro: none  Cardiac tracings I have personally interpreted today:  none  Pertinent Radiological findings (summarize): none   Lab Results  Component Value Date   WBC 6.2 03/28/2020   HGB 14.4 03/28/2020   HCT 42.2 03/28/2020   PLT 224.0 03/28/2020   GLUCOSE 91 08/12/2020   CHOL 155 03/28/2020   TRIG 88.0 03/28/2020   HDL 49.50 03/28/2020   LDLCALC 87 03/28/2020   ALT 21 03/28/2020   AST 16 03/28/2020   NA 137 08/12/2020   K 3.9 08/12/2020   CL 104 08/12/2020  CREATININE 0.66 08/12/2020   BUN 11 08/12/2020   CO2 26 08/12/2020    TSH 1.29 03/28/2020   HGBA1C 5.6 08/12/2020   Assessment/Plan:  Laura [name] is a 45 y.o. Black or African American [2] female with  has a past medical history of ALLERGIC RHINITIS (06/01/2008), Anemia, Anxiety (08/15/2010), ASTHMA (06/01/2008), Asthma, BURSITIS, LEFT HIP (04/28/2010), GERD (gastroesophageal reflux disease), GOITER, MULTINODULAR (06/28/2008), Hirsutism (06/02/2009), HYPERTENSION (06/01/2008), Palpitations (07/25/2010), PERIPHERAL EDEMA (04/28/2010), Pharyngitis, acute (10/09/2010), and TONSILLITIS, RECURRENT (05/05/2009).  Essential hypertension BP Readings from Last 3 Encounters:  08/12/20 122/70  03/28/20 (!) 138/102  08/15/18 136/88   Improved and now Stable, pt to continue medical treatment losartan, labetolol   Current Outpatient Medications (Cardiovascular):  .  losartan (COZAAR) 100 MG tablet, Take 1 tablet (100 mg total) by mouth daily. .  metoprolol succinate (TOPROL-XL) 100 MG 24 hr tablet, TAKE 1 TABLET (100 MG TOTAL) BY MOUTH DAILY. TAKE WITH OR IMMEDIATELY FOLLOWING A MEAL. .  furosemide (LASIX) 40 MG tablet, TAKE 1 TABLET BY MOUTH EVERY DAY (Patient not taking: Reported on 08/12/2020) .  labetalol (NORMODYNE) 300 MG tablet, labetalol 300 mg tablet (Patient not taking: Reported on 08/12/2020)  Current Outpatient Medications (Respiratory):  .  fluticasone (FLONASE) 50 MCG/ACT nasal spray, USE 2 SPRAYS IN BOTH NOSTRILS DAILY (Patient taking differently: Place 2 sprays into both nostrils daily as needed for allergies.) .  albuterol (PROVENTIL HFA;VENTOLIN HFA) 108 (90 BASE) MCG/ACT inhaler, Inhale 2 puffs into the lungs every 4 (four) hours as needed. (Patient not taking: Reported on 08/12/2020) .  fluticasone (FLOVENT DISKUS) 50 MCG/BLIST diskus inhaler, 1 spray in each nostril (Patient not taking: Reported on 08/12/2020)  Current Outpatient Medications (Analgesics):  .  acetaminophen (TYLENOL) 325 MG tablet, Take 325 mg by mouth every 6 (six) hours as needed for  moderate pain or headache. (Patient not taking: Reported on 08/12/2020)  Current Outpatient Medications (Hematological):  Marland Kitchen  Fe Bisgly-Succ-C-Thre-B12-FA (IRON 21/7 PO),  .  Ferrous Sulfate (FEOSOL PO), Take 1 tablet by mouth daily.  Current Outpatient Medications (Other):  Marland Kitchen  Liniments (SALONPAS PAIN RELIEF PATCH EX), Apply 1 patch topically daily as needed (pain). Marland Kitchen  solifenacin (VESICARE) 5 MG tablet, Take 1 tablet (5 mg total) by mouth daily. Marland Kitchen  KLOR-CON M10 10 MEQ tablet, TAKE 1 TABLET (10 MEQ TOTAL) BY MOUTH DAILY AS NEEDED (SWELLING). TAKE WITH LASIX (Patient not taking: Reported on 08/12/2020) .  Vitamin D, Ergocalciferol, (DRISDOL) 1.25 MG (50000 UNIT) CAPS capsule, Take 1 capsule (50,000 Units total) by mouth every 7 (seven) days. (Patient not taking: Reported on 08/12/2020)   Vitamin D deficiency Last vitamin D Lab Results  Component Value Date   VD25OH 28.45 (L) 08/12/2020   Low, to start 2000 u qd oral replacement   Prediabetes Lab Results  Component Value Date   HGBA1C 5.6 08/12/2020   Stable, pt to continue current medical treatment  - diet, and wt control   Nocturia Rather severe without fever or gross hematuria, for ua and cx, vesicare 5 qd trial, and refer urology for severe symptoms  Followup: Return in about 6 weeks (around 09/26/2020).  Cathlean Cower, MD 08/13/2020 9:55 PM [facility]

## 2020-08-12 NOTE — Patient Instructions (Signed)
Please take all new medication as prescribed - the vesicare 5 mg per day ° °Please continue all other medications as before, and refills have been done if requested. ° °Please have the pharmacy call with any other refills you may need. ° °Please continue your efforts at being more active, low cholesterol diet, and weight control ° °Please keep your appointments with your specialists as you may have planned ° °Please go to the LAB at the blood drawing area for the tests to be done ° °You will be contacted by phone if any changes need to be made immediately.  Otherwise, you will receive a letter about your results with an explanation, but please check with MyChart first. ° °Please remember to sign up for MyChart if you have not done so, as this will be important to you in the future with finding out test results, communicating by private email, and scheduling acute appointments online when needed. ° ° ° ° ° ° °

## 2020-08-13 ENCOUNTER — Encounter: Payer: Self-pay | Admitting: Internal Medicine

## 2020-08-13 NOTE — Assessment & Plan Note (Signed)
BP Readings from Last 3 Encounters:  08/12/20 122/70  03/28/20 (!) 138/102  08/15/18 136/88   Improved and now Stable, pt to continue medical treatment losartan, labetolol   Current Outpatient Medications (Cardiovascular):  .  losartan (COZAAR) 100 MG tablet, Take 1 tablet (100 mg total) by mouth daily. .  metoprolol succinate (TOPROL-XL) 100 MG 24 hr tablet, TAKE 1 TABLET (100 MG TOTAL) BY MOUTH DAILY. TAKE WITH OR IMMEDIATELY FOLLOWING A MEAL. .  furosemide (LASIX) 40 MG tablet, TAKE 1 TABLET BY MOUTH EVERY DAY (Patient not taking: Reported on 08/12/2020) .  labetalol (NORMODYNE) 300 MG tablet, labetalol 300 mg tablet (Patient not taking: Reported on 08/12/2020)  Current Outpatient Medications (Respiratory):  .  fluticasone (FLONASE) 50 MCG/ACT nasal spray, USE 2 SPRAYS IN BOTH NOSTRILS DAILY (Patient taking differently: Place 2 sprays into both nostrils daily as needed for allergies.) .  albuterol (PROVENTIL HFA;VENTOLIN HFA) 108 (90 BASE) MCG/ACT inhaler, Inhale 2 puffs into the lungs every 4 (four) hours as needed. (Patient not taking: Reported on 08/12/2020) .  fluticasone (FLOVENT DISKUS) 50 MCG/BLIST diskus inhaler, 1 spray in each nostril (Patient not taking: Reported on 08/12/2020)  Current Outpatient Medications (Analgesics):  .  acetaminophen (TYLENOL) 325 MG tablet, Take 325 mg by mouth every 6 (six) hours as needed for moderate pain or headache. (Patient not taking: Reported on 08/12/2020)  Current Outpatient Medications (Hematological):  Marland Kitchen  Fe Bisgly-Succ-C-Thre-B12-FA (IRON 21/7 PO),  .  Ferrous Sulfate (FEOSOL PO), Take 1 tablet by mouth daily.  Current Outpatient Medications (Other):  Marland Kitchen  Liniments (SALONPAS PAIN RELIEF PATCH EX), Apply 1 patch topically daily as needed (pain). Marland Kitchen  solifenacin (VESICARE) 5 MG tablet, Take 1 tablet (5 mg total) by mouth daily. Marland Kitchen  KLOR-CON M10 10 MEQ tablet, TAKE 1 TABLET (10 MEQ TOTAL) BY MOUTH DAILY AS NEEDED (SWELLING). TAKE WITH LASIX  (Patient not taking: Reported on 08/12/2020) .  Vitamin D, Ergocalciferol, (DRISDOL) 1.25 MG (50000 UNIT) CAPS capsule, Take 1 capsule (50,000 Units total) by mouth every 7 (seven) days. (Patient not taking: Reported on 08/12/2020)

## 2020-08-13 NOTE — Assessment & Plan Note (Signed)
Rather severe without fever or gross hematuria, for ua and cx, vesicare 5 qd trial, and refer urology for severe symptoms

## 2020-08-13 NOTE — Assessment & Plan Note (Signed)
Last vitamin D Lab Results  Component Value Date   VD25OH 28.45 (L) 08/12/2020   Low, to start 2000 u qd oral replacement

## 2020-08-13 NOTE — Assessment & Plan Note (Signed)
Lab Results  Component Value Date   HGBA1C 5.6 08/12/2020   Stable, pt to continue current medical treatment  - diet, and wt control

## 2020-08-14 LAB — URINE CULTURE

## 2020-08-15 ENCOUNTER — Encounter: Payer: Self-pay | Admitting: Internal Medicine

## 2020-09-08 DIAGNOSIS — Z1211 Encounter for screening for malignant neoplasm of colon: Secondary | ICD-10-CM | POA: Diagnosis not present

## 2020-09-08 DIAGNOSIS — D509 Iron deficiency anemia, unspecified: Secondary | ICD-10-CM | POA: Diagnosis not present

## 2020-09-26 ENCOUNTER — Ambulatory Visit: Payer: BC Managed Care – PPO | Admitting: Internal Medicine

## 2020-12-29 ENCOUNTER — Other Ambulatory Visit: Payer: Self-pay | Admitting: Internal Medicine

## 2020-12-29 NOTE — Telephone Encounter (Signed)
Please refill as per office routine med refill policy (all routine meds refilled for 3 mo or monthly per pt preference up to one year from last visit, then month to month grace period for 3 mo, then further med refills will have to be denied)  

## 2021-02-15 ENCOUNTER — Other Ambulatory Visit: Payer: Self-pay | Admitting: Internal Medicine

## 2021-02-15 NOTE — Telephone Encounter (Signed)
Please refill as per office routine med refill policy (all routine meds to be refilled for 3 mo or monthly (per pt preference) up to one year from last visit, then month to month grace period for 3 mo, then further med refills will have to be denied) ? ?

## 2021-03-27 ENCOUNTER — Other Ambulatory Visit: Payer: Self-pay | Admitting: Internal Medicine

## 2021-03-27 NOTE — Telephone Encounter (Signed)
Please refill as per office routine med refill policy (all routine meds to be refilled for 3 mo or monthly (per pt preference) up to one year from last visit, then month to month grace period for 3 mo, then further med refills will have to be denied) ? ?

## 2021-05-06 ENCOUNTER — Other Ambulatory Visit: Payer: Self-pay | Admitting: Internal Medicine

## 2021-05-06 NOTE — Telephone Encounter (Signed)
Please refill as per office routine med refill policy (all routine meds to be refilled for 3 mo or monthly (per pt preference) up to one year from last visit, then month to month grace period for 3 mo, then further med refills will have to be denied) ? ?

## 2021-06-24 ENCOUNTER — Other Ambulatory Visit: Payer: Self-pay | Admitting: Internal Medicine

## 2021-06-24 NOTE — Telephone Encounter (Signed)
Please refill as per office routine med refill policy (all routine meds to be refilled for 3 mo or monthly (per pt preference) up to one year from last visit, then month to month grace period for 3 mo, then further med refills will have to be denied) ? ?

## 2021-08-14 DIAGNOSIS — Z1231 Encounter for screening mammogram for malignant neoplasm of breast: Secondary | ICD-10-CM | POA: Diagnosis not present

## 2021-09-24 ENCOUNTER — Other Ambulatory Visit: Payer: Self-pay | Admitting: Internal Medicine

## 2021-09-24 NOTE — Telephone Encounter (Signed)
Please refill as per office routine med refill policy (all routine meds to be refilled for 3 mo or monthly (per pt preference) up to one year from last visit, then month to month grace period for 3 mo, then further med refills will have to be denied) ? ?

## 2021-09-25 ENCOUNTER — Other Ambulatory Visit: Payer: Self-pay | Admitting: Internal Medicine

## 2021-09-26 ENCOUNTER — Other Ambulatory Visit: Payer: Self-pay | Admitting: Internal Medicine

## 2021-09-27 ENCOUNTER — Other Ambulatory Visit: Payer: Self-pay

## 2021-09-27 MED ORDER — METOPROLOL SUCCINATE ER 100 MG PO TB24: 100.0000 mg | ORAL_TABLET | Freq: Every day | ORAL | 0 refills | Status: DC

## 2021-09-27 MED ORDER — LOSARTAN POTASSIUM 100 MG PO TABS: 100.0000 mg | ORAL_TABLET | Freq: Every day | ORAL | 0 refills | Status: DC

## 2021-09-28 ENCOUNTER — Ambulatory Visit: Payer: BC Managed Care – PPO | Admitting: Internal Medicine

## 2021-09-28 ENCOUNTER — Encounter: Payer: Self-pay | Admitting: Internal Medicine

## 2021-09-28 VITALS — BP 152/88 | HR 89 | Temp 98.3°F | Ht 60.0 in | Wt 234.0 lb

## 2021-09-28 DIAGNOSIS — E559 Vitamin D deficiency, unspecified: Secondary | ICD-10-CM | POA: Diagnosis not present

## 2021-09-28 DIAGNOSIS — Z6841 Body Mass Index (BMI) 40.0 and over, adult: Secondary | ICD-10-CM

## 2021-09-28 DIAGNOSIS — E538 Deficiency of other specified B group vitamins: Secondary | ICD-10-CM | POA: Diagnosis not present

## 2021-09-28 DIAGNOSIS — Z0001 Encounter for general adult medical examination with abnormal findings: Secondary | ICD-10-CM

## 2021-09-28 DIAGNOSIS — R7303 Prediabetes: Secondary | ICD-10-CM

## 2021-09-28 DIAGNOSIS — I10 Essential (primary) hypertension: Secondary | ICD-10-CM | POA: Diagnosis not present

## 2021-09-28 MED ORDER — SOLIFENACIN SUCCINATE 5 MG PO TABS: 5.0000 mg | ORAL_TABLET | Freq: Every day | ORAL | 3 refills | Status: DC

## 2021-09-28 MED ORDER — LOSARTAN POTASSIUM 100 MG PO TABS: 100.0000 mg | ORAL_TABLET | Freq: Every day | ORAL | 3 refills | Status: DC

## 2021-09-28 MED ORDER — METOPROLOL SUCCINATE ER 100 MG PO TB24: 100.0000 mg | ORAL_TABLET | Freq: Every day | ORAL | 3 refills | Status: DC

## 2021-09-28 NOTE — Assessment & Plan Note (Signed)
Last vitamin D ?Lab Results  ?Component Value Date  ? VD25OH 28.45 (L) 08/12/2020  ? ?Low, to start oral replacement ? ?

## 2021-09-28 NOTE — Patient Instructions (Signed)
Please call if you would like to start the [facility] or [facility] for wt loss ? ?Please continue all other medications as before, and refills have been done if requested. ? ?Please have the pharmacy call with any other refills you may need. ? ?Please continue your efforts at being more active, low cholesterol diet, and weight control. ? ?You are otherwise up to date with prevention measures today. ? ?Please keep your appointments with your specialists as you may have planned ? ?Please go to the LAB at the blood drawing area for the tests to be done - at the [facility] at your convenience ?You will be contacted by phone if any changes need to be made immediately.  Otherwise, you will receive a letter about your results with an explanation, but please check with MyChart first. ? ?Please remember to sign up for MyChart if you have not done so, as this will be important to you in the future with finding out test results, communicating by private email, and scheduling acute appointments online when needed. ? ?Please make an Appointment to return for your 1 year visit, or sooner if needed ?

## 2021-09-28 NOTE — Progress Notes (Signed)
Patient ID: Laura [name], female   DOB: 13-Aug-1975, 46 y.o.   MRN: [id number] ? ? ? ?     Chief Complaint:: wellness exam and htn, low vit d, hyeprglycemia, obesity ? ?     HPI:  Laura [name] is a 45 y.o. female here for wellness exam; has colonoscopy for next mo scheduled; declines tdap, o/w up to date ?         ?              Also not taking vit d.  Pt denies chest pain, increased sob or doe, wheezing, orthopnea, PND, increased LE swelling, palpitations, dizziness or syncope.   Pt denies polydipsia, polyuria, or new focal neuro s/s.    Pt denies fever, wt loss, night sweats, loss of appetite, or other constitutional symptoms   ?Unable to lose wt recently with exercise and reduced calories.  Currently run out of BP med for over 1 wk.   ?  ?Wt Readings from Last 3 Encounters:  ?09/28/21 234 lb (106.1 kg)  ?08/12/20 208 lb (94.3 kg)  ?03/28/20 229 lb (103.9 kg)  ? ?BP Readings from Last 3 Encounters:  ?09/28/21 (!) 152/88  ?08/12/20 122/70  ?03/28/20 (!) 138/102  ? ?Immunization History  ?Administered Date(s) Administered  ? Hep A / Hep B 07/31/2010, 08/31/2010, 01/30/2011  ? Influenza Whole 03/09/2009, 04/28/2010  ? Influenza,inj,Quad PF,6+ Mos 08/15/2018, 03/28/2020  ? Influenza-Unspecified 02/26/2012  ? PFIZER(Purple Top)SARS-COV-2 Vaccination 08/07/2019, 08/28/2019  ? Td 10/26/2008  ? ?Health Maintenance Due  ?Topic Date Due  ? COLONOSCOPY (Pts 45-20yr Insurance coverage will need to be confirmed)  Never done  ? ?  ? ?Past Medical History:  ?Diagnosis Date  ? ALLERGIC RHINITIS 06/01/2008  ? Anemia   ? Anxiety 08/15/2010  ? ASTHMA 06/01/2008  ? Asthma   ? BURSITIS, LEFT HIP 04/28/2010  ? GERD (gastroesophageal reflux disease)   ? GOITER, MULTINODULAR 06/28/2008  ? Hirsutism 06/02/2009  ? HYPERTENSION 06/01/2008  ? Palpitations 07/25/2010  ? PERIPHERAL EDEMA 04/28/2010  ? Pharyngitis, acute 10/09/2010  ? TONSILLITIS, RECURRENT 05/05/2009  ? ?Past Surgical History:  ?Procedure Laterality Date  ? bilateral turbiante  reductions    ? CESAREAN SECTION  2007  ? DILATATION & CURETTAGE/HYSTEROSCOPY WITH MYOSURE N/A 07/22/2018  ? Procedure: DILATATION & CURETTAGE/HYSTEROSCOPY WITH MYOSURE XL;  Surgeon: CServando Salina MD;  [facility]  Service: Gynecology;  Laterality: N/A;  ? s/p sinus surgury  2006  ? ? reports that she has never smoked. She has never used smokeless tobacco. She reports that she does not drink alcohol and does not use drugs. ?family history includes Hyperlipidemia in her maternal grandfather, maternal grandmother, and mother; Hypertension in her maternal grandfather, maternal grandmother, and mother; Lung cancer in her maternal grandfather. ?No Known Allergies ?Current Outpatient Medications on File Prior to Visit  ?Medication Sig Dispense Refill  ? acetaminophen (TYLENOL) 325 MG tablet Take 325 mg by mouth every 6 (six) hours as needed for moderate pain or headache.    ? albuterol (PROVENTIL HFA;VENTOLIN HFA) 108 (90 BASE) MCG/ACT inhaler Inhale 2 puffs into the lungs every 4 (four) hours as needed. 3 Inhaler 3  ? Cholecalciferol (VITAMIN D3) 50 MCG (2000 UT) CHEW Chew by mouth.    ? Fe Bisgly-Succ-C-Thre-B12-FA (IRON 21/7 PO)     ? Ferrous Sulfate (FEOSOL PO) Take 1 tablet by mouth daily.    ? fluticasone (FLONASE) 50 MCG/ACT nasal spray USE 2 SPRAYS IN BOTH NOSTRILS DAILY (  Patient taking differently: Place 2 sprays into both nostrils daily as needed for allergies.) 16 g 2  ? fluticasone (FLOVENT DISKUS) 50 MCG/BLIST diskus inhaler     ? Liniments (SALONPAS PAIN RELIEF PATCH EX) Apply 1 patch topically daily as needed (pain).    ? ?No current facility-administered medications on file prior to visit.  ? ?     ROS:  All others reviewed and negative. ? ?Objective  ? ?     PE:  BP (!) 152/88 (BP Location: Right Arm, Patient Position: Sitting, Cuff Size: Large)   Pulse 89   Temp 98.3 ?F (36.8 ?C) (Oral)   Ht 5' (1.524 m)   Wt 234 lb (106.1 kg)   SpO2 98%   BMI 45.70 kg/m?  ? ?               Constitutional: Pt appears in NAD ?              HENT: Head: NCAT.  ?              Right Ear: External ear normal.   ?              Left Ear: External ear normal.  ?              Eyes: . Pupils are equal, round, and reactive to light. Conjunctivae and EOM are normal ?              Nose: without d/c or deformity ?              Neck: Neck supple. Gross normal ROM ?              Cardiovascular: Normal rate and regular rhythm.   ?              Pulmonary/Chest: Effort normal and breath sounds without rales or wheezing.  ?              Abd:  Soft, NT, ND, + BS, no organomegaly ?              Neurological: Pt is alert. At baseline orientation, motor grossly intact ?              Skin: Skin is warm. No rashes, no other new lesions, LE edema - none ?              Psychiatric: Pt behavior is normal without agitation  ? ?Micro: none ? ?Cardiac tracings I have personally interpreted today:  none ? ?Pertinent Radiological findings (summarize): none  ? ?Lab Results  ?Component Value Date  ? WBC 6.2 03/28/2020  ? HGB 14.4 03/28/2020  ? HCT 42.2 03/28/2020  ? PLT 224.0 03/28/2020  ? GLUCOSE 91 08/12/2020  ? CHOL 155 03/28/2020  ? TRIG 88.0 03/28/2020  ? HDL 49.50 03/28/2020  ? [place] 87 03/28/2020  ? ALT 21 03/28/2020  ? AST 16 03/28/2020  ? NA 137 08/12/2020  ? K 3.9 08/12/2020  ? CL 104 08/12/2020  ? CREATININE 0.66 08/12/2020  ? BUN 11 08/12/2020  ? CO2 26 08/12/2020  ? TSH 1.29 03/28/2020  ? HGBA1C 5.6 08/12/2020  ? ?Assessment/Plan:  ?Laura [name] is a 46 y.o. Black or African American [2] female with  has a past medical history of ALLERGIC RHINITIS (06/01/2008), Anemia, Anxiety (08/15/2010), ASTHMA (06/01/2008), Asthma, BURSITIS, LEFT HIP (04/28/2010), GERD (gastroesophageal reflux disease), GOITER, MULTINODULAR (06/28/2008), Hirsutism (06/02/2009), HYPERTENSION (06/01/2008), Palpitations (07/25/2010), PERIPHERAL EDEMA (04/28/2010), Pharyngitis,  acute (10/09/2010), and TONSILLITIS, RECURRENT (05/05/2009). ? ?Vitamin D  deficiency ?Last vitamin D ?Lab Results  ?Component Value Date  ? VD25OH 28.45 (L) 08/12/2020  ? ?Low, to start oral replacement ? ? ?Encounter for well adult exam with abnormal findings ?Age and sex appropriate education and counseling updated with regular exercise and diet ?Referrals for preventative services - has colonoscopy scheduled for next month ?Immunizations addressed - declines tdap ?Smoking counseling  - none needed ?Evidence for depression or other mood disorder - none significant ?Most recent labs reviewed. ?I have personally reviewed and have noted: ?1) the patient's medical and social history ?2) The patient's current medications and supplements ?3) The patient's height, weight, and BMI have been recorded in the chart ? ? ?Essential hypertension ?BP Readings from Last 3 Encounters:  ?09/28/21 (!) 152/88  ?08/12/20 122/70  ?03/28/20 (!) 138/102  ? ?uncontrolled, pt to restart medical treatment losartan, toprol ? ? ?Prediabetes ?Lab Results  ?Component Value Date  ? HGBA1C 5.6 08/12/2020  ? ?Stable, pt to continue current medical treatment  - diet ? ? ?Obesity ?Persistent, pt to consider wegovy or mounjaro - pt to check on insurance coverage ? ?Followup: Return in about 1 year (around 09/29/2022). ? ?Cathlean Cower, MD 10/04/2021 8:29 PM ?[place] ?[place] ?Internal Medicine ?

## 2021-10-04 ENCOUNTER — Encounter: Payer: Self-pay | Admitting: Internal Medicine

## 2021-10-04 NOTE — Assessment & Plan Note (Signed)
Age and sex appropriate education and counseling updated with regular exercise and diet ?Referrals for preventative services - has colonoscopy scheduled for next month ?Immunizations addressed - declines tdap ?Smoking counseling  - none needed ?Evidence for depression or other mood disorder - none significant ?Most recent labs reviewed. ?I have personally reviewed and have noted: ?1) the patient's medical and social history ?2) The patient's current medications and supplements ?3) The patient's height, weight, and BMI have been recorded in the chart ? ?

## 2021-10-04 NOTE — Assessment & Plan Note (Signed)
Persistent, pt to consider wegovy or mounjaro - pt to check on insurance coverage ?

## 2021-10-04 NOTE — Assessment & Plan Note (Signed)
Lab Results  ?Component Value Date  ? HGBA1C 5.6 08/12/2020  ? ?Stable, pt to continue current medical treatment  - diet ? ?

## 2021-10-04 NOTE — Assessment & Plan Note (Signed)
BP Readings from Last 3 Encounters:  ?09/28/21 (!) 152/88  ?08/12/20 122/70  ?03/28/20 (!) 138/102  ? ?uncontrolled, pt to restart medical treatment losartan, toprol ? ?

## 2021-12-05 DIAGNOSIS — Z01419 Encounter for gynecological examination (general) (routine) without abnormal findings: Secondary | ICD-10-CM | POA: Diagnosis not present

## 2022-06-04 ENCOUNTER — Encounter: Payer: Self-pay | Admitting: Internal Medicine

## 2022-06-04 DIAGNOSIS — R350 Frequency of micturition: Secondary | ICD-10-CM

## 2022-06-04 DIAGNOSIS — I872 Venous insufficiency (chronic) (peripheral): Secondary | ICD-10-CM

## 2022-06-05 MED ORDER — WEGOVY 0.5 MG/0.5ML ~~LOC~~ SOAJ: 0.5000 mg | SUBCUTANEOUS | 11 refills | Status: DC

## 2022-06-11 ENCOUNTER — Other Ambulatory Visit (HOSPITAL_COMMUNITY): Payer: Self-pay

## 2022-06-11 NOTE — Telephone Encounter (Signed)
Patient Advocate Encounter   Received notification from [employer] that prior authorization for [facility] 0.'5MG'$ 0.5ML is required.   PA submitted on 06/11/2022 Key [id number]  Status is pending

## 2022-06-12 NOTE — Telephone Encounter (Signed)
Pharmacy Patient Advocate Encounter  Received notification from [facility] that the request for prior authorization for [facility] 0.'5mg'$  injection has been denied due to .    You may call [phone] or fax [phone], to appeal.  Please be advised we currently do not have a Pharmacist to review denials. If you would like [place] to submit it on your behalf, please provide clinical information to support your reason for appeal and any pertinent information you would like [place] to include with the appeal request. Appeals may take longer 5 business days to be submitted as we prepares necessary documentation. Thanks for your support.  How would you like to proceed?

## 2022-06-13 ENCOUNTER — Other Ambulatory Visit: Payer: Self-pay | Admitting: *Deleted

## 2022-06-13 DIAGNOSIS — I872 Venous insufficiency (chronic) (peripheral): Secondary | ICD-10-CM

## 2022-06-15 ENCOUNTER — Ambulatory Visit: Payer: BC Managed Care – PPO | Admitting: Urology

## 2022-06-15 ENCOUNTER — Encounter: Payer: Self-pay | Admitting: Urology

## 2022-06-15 VITALS — BP 164/95 | HR 71 | Ht 61.0 in | Wt 233.0 lb

## 2022-06-15 DIAGNOSIS — R350 Frequency of micturition: Secondary | ICD-10-CM | POA: Diagnosis not present

## 2022-06-15 DIAGNOSIS — R351 Nocturia: Secondary | ICD-10-CM | POA: Diagnosis not present

## 2022-06-15 LAB — URINALYSIS
Bilirubin, UA: NEGATIVE
Glucose, UA: NEGATIVE mg/dL
Ketones, UA: NEGATIVE
Leukocytes, UA: NEGATIVE
Nitrite, UA: NEGATIVE
Protein, UA: POSITIVE — AB
Spec Grav, UA: >=1.03 — AB (ref 1.010–1.025)
Urobilinogen, UA: 0.2 E.U./dL
pH, UA: 5.5 (ref 5.0–8.0)

## 2022-06-15 LAB — BLADDER SCAN AMB NON-IMAGING

## 2022-06-15 MED ORDER — MIRABEGRON ER 25 MG PO TB24: 25.0000 mg | ORAL_TABLET | Freq: Every day | ORAL | 0 refills | Status: DC

## 2022-06-15 NOTE — Progress Notes (Signed)
Assessment: 1. Nocturia   2. Urinary frequency     Plan: I personally reviewed the patient's chart including provider notes and lab results. Diagnosis and management of overactive bladder symptoms discussed with the patient including avoidance of dietary irritants, behavioral modification, medical therapy, neuromodulation, and chemodenervation. Possible causes of nocturia discussed and information provided. Bladder diet sheet given. 24 hour voiding diary x 3 days Trial of Myrbetriq 25 mg qHS. Samples given. Repeat U/A in 2 weeks Return to office in 1 month  Chief Complaint:  Chief Complaint  Patient presents with   Nocturia    History of Present Illness:  Laura [name] is a 47 y.o. female who is seen in consultation from Biagio Borg, MD for evaluation of nocturia and urinary frequency.  She reports symptoms of urinary frequency and nocturia for approximately 3 years.  She has had some recent increase in her symptoms within the past year.  She voids approximately every 3 hours during the day.  She has nocturia every 30-60 minutes.  She also reports urinary urgency.  No urinary incontinence.  She voids with a good stream and feels like she empties her bladder completely.  No dysuria or gross hematuria.  She was previously given a trial of Vesicare in May 2023.  She discontinued the medicine after 1 month due to lack of benefit.  No other medical therapy has been tried.  She has not had any recent UTIs.  No problems with constipation or diarrhea.  No fecal incontinence.  Urine culture from 3/22: 10-49 K group B strep Urine culture from 11/21: 50-100 K E. coli  Past Medical History:  Past Medical History:  Diagnosis Date   ALLERGIC RHINITIS 06/01/2008   Anemia    Anxiety 08/15/2010   ASTHMA 06/01/2008   Asthma    BURSITIS, LEFT HIP 04/28/2010   GERD (gastroesophageal reflux disease)    GOITER, MULTINODULAR 06/28/2008   Hirsutism 06/02/2009   HYPERTENSION 06/01/2008    Palpitations 07/25/2010   PERIPHERAL EDEMA 04/28/2010   Pharyngitis, acute 10/09/2010   TONSILLITIS, RECURRENT 05/05/2009    Past Surgical History:  Past Surgical History:  Procedure Laterality Date   bilateral turbiante reductions     CESAREAN SECTION  2007   DILATATION & CURETTAGE/HYSTEROSCOPY WITH MYOSURE N/A 07/22/2018   Procedure: DILATATION & CURETTAGE/HYSTEROSCOPY WITH MYOSURE XL;  Surgeon: Servando Salina, MD;  Location: [place];  Service: Gynecology;  Laterality: N/A;   s/p sinus surgury  2006    Allergies:  No Known Allergies  Family History:  Family History  Problem Relation Age of Onset   Hypertension Mother    Hyperlipidemia Mother    Hypertension Maternal Grandmother    Hyperlipidemia Maternal Grandmother    Hypertension Maternal Grandfather    Hyperlipidemia Maternal Grandfather    Lung cancer Maternal Grandfather     Social History:  Social History   Tobacco Use   Smoking status: Never   Smokeless tobacco: Never  Vaping Use   Vaping Use: Never used  Substance Use Topics   Alcohol use: No   Drug use: No    Review of symptoms:  Constitutional:  Negative for unexplained weight loss, night sweats, fever, chills ENT:  Negative for nose bleeds, sinus pain, painful swallowing CV:  Negative for chest pain, shortness of breath, exercise intolerance, palpitations, loss of consciousness Resp:  Negative for cough, wheezing, shortness of breath GI:  Negative for nausea, vomiting, diarrhea, bloody stools GU:  Positives noted in HPI; otherwise negative  for gross hematuria, dysuria, urinary incontinence Neuro:  Negative for seizures, poor balance, limb weakness, slurred speech Psych:  Negative for lack of energy, depression, anxiety Endocrine:  Negative for polydipsia, polyuria, symptoms of hypoglycemia (dizziness, hunger, sweating) Hematologic:  Negative for anemia, purpura, petechia, prolonged or excessive bleeding, use of anticoagulants   Allergic:  Negative for difficulty breathing or choking as a result of exposure to anything; no shellfish allergy; no allergic response (rash/itch) to materials, foods  Physical exam: BP (!) 164/95   Pulse 71   Ht '5\' 1"'$  (1.549 m)   Wt 233 lb (105.7 kg)   BMI 44.02 kg/m  GENERAL APPEARANCE:  Well appearing, well developed, well nourished, NAD HEENT: Atraumatic, Normocephalic, oropharynx clear. NECK: Supple without lymphadenopathy or thyromegaly. LUNGS: Clear to auscultation bilaterally. HEART: Regular Rate and Rhythm without murmurs, gallops, or rubs. ABDOMEN: Soft, non-tender, No Masses. EXTREMITIES: Moves all extremities well.  Without clubbing, cyanosis, or edema. NEUROLOGIC:  Alert and oriented x 3, normal gait, CN II-XII grossly intact.  MENTAL STATUS:  Appropriate. BACK:  Non-tender to palpation.  No CVAT SKIN:  Warm, dry and intact.    Results: U/A dipstick: 3+ blood, trace protein (patient reports menstrual cycle)  PVR: 5 ml

## 2022-06-25 ENCOUNTER — Ambulatory Visit (HOSPITAL_COMMUNITY)
Admission: RE | Admit: 2022-06-25 | Discharge: 2022-06-25 | Disposition: A | Discharge status: Home / Self Care | Payer: BC Managed Care – PPO | Source: Ambulatory Visit | Attending: Surgery | Admitting: Surgery

## 2022-06-25 ENCOUNTER — Ambulatory Visit: Payer: BC Managed Care – PPO | Admitting: Physician Assistant

## 2022-06-25 VITALS — BP 182/107 | HR 95 | Temp 97.9°F | Resp 16 | Ht 61.0 in | Wt 237.0 lb

## 2022-06-25 DIAGNOSIS — I890 Lymphedema, not elsewhere classified: Secondary | ICD-10-CM | POA: Diagnosis not present

## 2022-06-25 DIAGNOSIS — I872 Venous insufficiency (chronic) (peripheral): Secondary | ICD-10-CM | POA: Diagnosis not present

## 2022-06-25 DIAGNOSIS — R600 Localized edema: Secondary | ICD-10-CM | POA: Diagnosis not present

## 2022-06-25 NOTE — Progress Notes (Signed)
Requested by:  Biagio Borg, MD [address] [place],  [place] [address]  Reason for consultation: venous insufficiency    History of Present Illness   Laura [name] is a 47 y.o. (1976/05/10) female who presents for evaluation of bilateral lower extremity swelling and heaviness. She denies any pain in her legs. She explains that the swelling has been an ongoing problem since she was in college when she first got diagnosed with hypertension. She has been evaluated for numerous causes without any relief. She has tried various combinations of antihypertensive medications to see if they were contributing to her edema. She does report some certainly made her legs worse, so she stopped those immediately. She has also tried diuretics with no improvement. She currently does elevate her legs with wedge pillow at night. Her legs are better upon first waking but does immediately return when she is up on them. She says she has tried dietary changes like low sodium and weight loss and still the edema did not change. She wears medical grade knee high compression stockings daily. She currently works Network engineer job as an Therapist, sports but she says she makes conscious effort to try to get up and move around throughout day. She reports no history of DVT or venous disease in her family.   Venous symptoms include: heavy, swelling Onset/duration:  > 20 years  Occupation:  RN Aggravating factors: sitting, standing Alleviating factors: elevation, compression Compression:  yes Helps:  minimally Pain medications:  no Previous vein procedures:  no History of DVT:  no  Past Medical History:  Diagnosis Date   ALLERGIC RHINITIS 06/01/2008   Anemia    Anxiety 08/15/2010   ASTHMA 06/01/2008   Asthma    BURSITIS, LEFT HIP 04/28/2010   GERD (gastroesophageal reflux disease)    GOITER, MULTINODULAR 06/28/2008   Hirsutism 06/02/2009   HYPERTENSION 06/01/2008   Palpitations 07/25/2010   PERIPHERAL EDEMA 04/28/2010   Pharyngitis,  acute 10/09/2010   TONSILLITIS, RECURRENT 05/05/2009    Past Surgical History:  Procedure Laterality Date   bilateral turbiante reductions     CESAREAN SECTION  2007   DILATATION & CURETTAGE/HYSTEROSCOPY WITH MYOSURE N/A 07/22/2018   Procedure: DILATATION & CURETTAGE/HYSTEROSCOPY WITH MYOSURE XL;  Surgeon: Servando Salina, MD;  Location: [place];  Service: Gynecology;  Laterality: N/A;   s/p sinus surgury  2006    Social History   Socioeconomic History   Marital status: Married    Spouse name: Maudell Stanbrough   Number of children: 1   Years of education: Not on file   Highest education level: Not on file  Occupational History   Occupation: Programmer, multimedia: UNEMPLOYED  Tobacco Use   Smoking status: Never   Smokeless tobacco: Never  Vaping Use   Vaping Use: Never used  Substance and Sexual Activity   Alcohol use: No   Drug use: No   Sexual activity: Not on file  Other Topics Concern   Not on file  Social History Narrative   Not on file   Social Determinants of Health   Financial Resource Strain: Not on file  Food Insecurity: Not on file  Transportation Needs: Not on file  Physical Activity: Not on file  Stress: Not on file  Social Connections: Not on file  Intimate Partner Violence: Not on file    Family History  Problem Relation Age of Onset   Hypertension Mother    Hyperlipidemia Mother    Hypertension Maternal Grandmother  Hyperlipidemia Maternal Grandmother    Hypertension Maternal Grandfather    Hyperlipidemia Maternal Grandfather    Lung cancer Maternal Grandfather     Current Outpatient Medications  Medication Sig Dispense Refill   Ferrous Sulfate (FEOSOL PO) Take 1 tablet by mouth daily.     losartan (COZAAR) 100 MG tablet Take 1 tablet (100 mg total) by mouth daily. 90 tablet 3   metoprolol succinate (TOPROL-XL) 100 MG 24 hr tablet Take 1 tablet (100 mg total) by mouth daily. TAKE WITH OR IMMEDIATELY FOLLOWING A MEAL. 90  tablet 3   mirabegron ER (MYRBETRIQ) 25 MG TB24 tablet Take 1 tablet (25 mg total) by mouth daily. 28 tablet 0   No current facility-administered medications for this visit.    No Known Allergies  REVIEW OF SYSTEMS (negative unless checked):   Cardiac:  '[]'$  Chest pain or chest pressure? '[]'$  Shortness of breath upon activity? '[]'$  Shortness of breath when lying flat? '[]'$  Irregular heart rhythm?  Vascular:  '[]'$  Pain in calf, thigh, or hip brought on by walking? '[]'$  Pain in feet at night that wakes you up from your sleep? '[]'$  Blood clot in your veins? '[x]'$  Leg swelling?  Pulmonary:  '[]'$  Oxygen at home? '[]'$  Productive cough? '[]'$  Wheezing?  Neurologic:  '[]'$  Sudden weakness in arms or legs? '[]'$  Sudden numbness in arms or legs? '[]'$  Sudden onset of difficult speaking or slurred speech? '[]'$  Temporary loss of vision in one eye? '[]'$  Problems with dizziness?  Gastrointestinal:  '[]'$  Blood in stool? '[]'$  Vomited blood?  Genitourinary:  '[]'$  Burning when urinating? '[]'$  Blood in urine?  Psychiatric:  '[]'$  Major depression  Hematologic:  '[]'$  Bleeding problems? '[]'$  Problems with blood clotting?  Dermatologic:  '[]'$  Rashes or ulcers?  Constitutional:  '[]'$  Fever or chills?  Ear/Nose/Throat:  '[]'$  Change in hearing? '[]'$  Nose bleeds? '[]'$  Sore throat?  Musculoskeletal:  '[]'$  Back pain? '[]'$  Joint pain? '[]'$  Muscle pain?   Physical Examination     Vitals:   06/25/22 1322  BP: (!) 182/107  Pulse: 95  Resp: 16  Temp: 97.9 F (36.6 C)  TempSrc: Temporal  SpO2: 97%  Weight: 237 lb (107.5 kg)  Height: '5\' 1"'$  (1.549 m)   Body mass index is 44.78 kg/m.  General:  WDWN in NAD; vital signs documented above Gait: Normal HENT: WNL, normocephalic Pulmonary: normal non-labored breathing , without wheezing Cardiac: regular HR, Vascular Exam/Pulses:2+ DP and PT pulses bilaterally, feet warm and well perfused Extremities: without varicose veins, without reticular veins, with edema, without stasis  pigmentation, with lipodermatosclerosis, with hyperplasia, without ulcers Musculoskeletal: no muscle wasting or atrophy  Neurologic: A&O X 3;  No focal weakness or paresthesias are detected Psychiatric:  The pt has Normal affect.  Non-invasive Vascular Imaging   BLE Venous Insufficiency Duplex (06/25/22):  RLE:  No DVT and SVT No GSV reflux  GSV diameter 0.43-0.50 cm No SSV reflux No deep venous reflux  Medical Decision Making   JAMISEN HAWES is a 47 y.o. female who presents with bilateral lower extremity edema. She has associated heaviness in her legs as well. Her duplex today shows normal compressibility of her veins in the right lower extremity. She has no DVT or SVT. No deep or superficial reflux. Based on these normal finding she would not be candidate for venous lazer ablation. I suspect that her edema is secondary possibly to her HTN medication, being overweight, and lymphedema.  Based on the patient's history and examination, I recommend: daily elevation above level of  her heart, knee high 15-20 mmHg compression stockings, exercise, weight management, and refraining from prolonged sitting or standing. She has tried conservative management with compression, elevation, exercise and weight management without any improvement for many years, I am prescribing a lymphedema pump to see if this will help manage her symptoms.  She can follow up as needed if she has new or concerning symptoms   Karoline Caldwell, PA-C [facility]: [phone]  06/25/2022, 1:50 PM  Clinic MD: Trula Slade

## 2022-06-29 ENCOUNTER — Other Ambulatory Visit: Payer: Self-pay

## 2022-06-29 ENCOUNTER — Other Ambulatory Visit: Payer: BC Managed Care – PPO

## 2022-06-29 DIAGNOSIS — R350 Frequency of micturition: Secondary | ICD-10-CM | POA: Diagnosis not present

## 2022-06-29 DIAGNOSIS — R351 Nocturia: Secondary | ICD-10-CM | POA: Diagnosis not present

## 2022-06-29 LAB — URINALYSIS
Bilirubin, UA: NEGATIVE
Glucose, UA: NEGATIVE mg/dL
Ketones, POC UA: NEGATIVE mg/dL
Nitrite, UA: NEGATIVE
Protein Ur, POC: NEGATIVE mg/dL
Spec Grav, UA: 1.025 (ref 1.010–1.025)
Urobilinogen, UA: 0.2 E.U./dL
pH, UA: 7 (ref 5.0–8.0)

## 2022-06-30 LAB — URINALYSIS, MICROSCOPIC ONLY
Bacteria, UA: NONE SEEN
Casts: NONE SEEN /lpf
Epithelial Cells (non renal): >10 /hpf — AB (ref 0–10)
WBC, UA: NONE SEEN /hpf (ref 0–5)

## 2022-07-13 ENCOUNTER — Encounter: Payer: Self-pay | Admitting: Urology

## 2022-07-13 ENCOUNTER — Ambulatory Visit: Payer: BC Managed Care – PPO | Admitting: Urology

## 2022-07-13 VITALS — BP 154/89 | HR 85 | Ht 60.0 in | Wt 230.0 lb

## 2022-07-13 DIAGNOSIS — R350 Frequency of micturition: Secondary | ICD-10-CM

## 2022-07-13 DIAGNOSIS — R351 Nocturia: Secondary | ICD-10-CM

## 2022-07-13 DIAGNOSIS — R3129 Other microscopic hematuria: Secondary | ICD-10-CM | POA: Diagnosis not present

## 2022-07-13 LAB — URINALYSIS
Bilirubin, UA: NEGATIVE
Glucose, UA: NEGATIVE mg/dL
Ketones, UA: NEGATIVE
Leukocytes, UA: NEGATIVE
Nitrite, UA: NEGATIVE
Protein, UA: NEGATIVE
Spec Grav, UA: 1.02 (ref 1.010–1.025)
Urobilinogen, UA: 0.2 E.U./dL
pH, UA: 7 (ref 5.0–8.0)

## 2022-07-13 MED ORDER — MIRABEGRON ER 25 MG PO TB24: 25.0000 mg | ORAL_TABLET | Freq: Every day | ORAL | 11 refills | Status: DC

## 2022-07-13 NOTE — Progress Notes (Signed)
Assessment: 1. Nocturia   2. Urinary frequency   3. Microscopic hematuria     Plan: Continue bladder diet Continue Myrbetriq 25 mg qHS. Samples and rx given. Today I had a discussion with the patient regarding the findings of microscopic hematuria including the implications and differential diagnoses associated with it.  I also discussed recommendations for further evaluation including the rationale for upper tract imaging and cystoscopy.  I discussed the nature of these procedures including potential risk and complications.  The patient expressed an understanding of these issues. Schedule for CT hematuria protocol followed by cystoscopy.  Chief Complaint:  Chief Complaint  Patient presents with   Urinary Frequency   History of Present Illness:  Laura [name] is a 47 y.o. female who is seen for further evaluation of nocturia and urinary frequency.  At her initial visit in 1/24, she reported symptoms of urinary frequency and nocturia for approximately 3 years.  She had some recent increase in her symptoms within the past year.  She was voiding approximately every 3 hours during the day and had nocturia every 30-60 minutes.  She also reported urinary urgency.  No urinary incontinence.  She was voiding with a good stream and felt like she emptied her bladder completely.  No dysuria or gross hematuria.  She was previously given a trial of Vesicare in May 2023.  She discontinued the medicine after 1 month due to lack of benefit.  No other medical therapy had been tried.  She has not had any recent UTIs.  No problems with constipation or diarrhea.  No fecal incontinence.  Urine culture from 3/22: 10-49 K group B strep Urine culture from 11/21: 50-100 K E. Coli Urinalysis from 06/30/2022 showed no WBCs, 11-30 RBCs, >10 epithelial cells, no bacteria. He was instructed on a bladder diet and given a trial of Myrbetriq 25 mg nightly for her frequency and nocturia.  She returns today for  follow-up.  She has noted improvement in her nocturia with the Myrbetriq 25 mg.  Her nocturia has decreased from 8 times per night to 3-4 times per night.  Her daytime frequency has improved as well.  No side effects from the medication.  No dysuria or gross hematuria.  No flank pain.  Portions of the above documentation were copied from a prior visit for review purposes only.   Past Medical History:  Past Medical History:  Diagnosis Date   ALLERGIC RHINITIS 06/01/2008   Anemia    Anxiety 08/15/2010   ASTHMA 06/01/2008   Asthma    BURSITIS, LEFT HIP 04/28/2010   GERD (gastroesophageal reflux disease)    GOITER, MULTINODULAR 06/28/2008   Hirsutism 06/02/2009   HYPERTENSION 06/01/2008   Palpitations 07/25/2010   PERIPHERAL EDEMA 04/28/2010   Pharyngitis, acute 10/09/2010   TONSILLITIS, RECURRENT 05/05/2009    Past Surgical History:  Past Surgical History:  Procedure Laterality Date   bilateral turbiante reductions     CESAREAN SECTION  2007   DILATATION & CURETTAGE/HYSTEROSCOPY WITH MYOSURE N/A 07/22/2018   Procedure: DILATATION & CURETTAGE/HYSTEROSCOPY WITH MYOSURE XL;  Surgeon: Servando Salina, MD;  Location: [facility];  Service: Gynecology;  Laterality: N/A;   s/p sinus surgury  2006    Allergies:  No Known Allergies  Family History:  Family History  Problem Relation Age of Onset   Hypertension Mother    Hyperlipidemia Mother    Hypertension Maternal Grandmother    Hyperlipidemia Maternal Grandmother    Hypertension Maternal Grandfather    Hyperlipidemia Maternal  Grandfather    Lung cancer Maternal Grandfather     Social History:  Social History   Tobacco Use   Smoking status: Never   Smokeless tobacco: Never  Vaping Use   Vaping Use: Never used  Substance Use Topics   Alcohol use: No   Drug use: No    ROS: Constitutional:  Negative for fever, chills, weight loss CV: Negative for chest pain, previous MI, hypertension Respiratory:  Negative for  shortness of breath, wheezing, sleep apnea, frequent cough GI:  Negative for nausea, vomiting, bloody stool, GERD  Physical exam: BP (!) 154/89   Pulse 85   Ht 5' (1.524 m)   Wt 230 lb (104.3 kg)   BMI 44.92 kg/m  GENERAL APPEARANCE:  Well appearing, well developed, well nourished, NAD HEENT:  Atraumatic, normocephalic, oropharynx clear NECK:  Supple without lymphadenopathy or thyromegaly ABDOMEN:  Soft, non-tender, no masses EXTREMITIES:  Moves all extremities well, without clubbing, cyanosis, or edema NEUROLOGIC:  Alert and oriented x 3, normal gait, CN II-XII grossly intact MENTAL STATUS:  appropriate BACK:  Non-tender to palpation, No CVAT SKIN:  Warm, dry, and intact  Results: Results for orders placed or performed in visit on 07/13/22 (from the past 24 hour(s))  Urinalysis     Status: None   Collection Time: 07/13/22 12:00 AM  Result Value Ref Range   Glucose, UA negative negative mg/dL   Bilirubin, UA negatiive    Ketones, UA negative    Spec Grav, UA 1.020 1.010 - 1.025   Blood, UA 3+    pH, UA 7.0 5.0 - 8.0   Protein, UA Negative Negative   Urobilinogen, UA 0.2 0.2 or 1.0 E.U./dL   Nitrite, UA negative    Leukocytes, UA Negative Negative

## 2022-07-25 DIAGNOSIS — I890 Lymphedema, not elsewhere classified: Secondary | ICD-10-CM | POA: Diagnosis not present

## 2022-08-16 ENCOUNTER — Encounter: Payer: Self-pay | Admitting: Internal Medicine

## 2022-08-16 ENCOUNTER — Encounter: Payer: Self-pay | Admitting: Urology

## 2022-08-16 ENCOUNTER — Other Ambulatory Visit: Payer: Self-pay

## 2022-08-16 DIAGNOSIS — R350 Frequency of micturition: Secondary | ICD-10-CM

## 2022-08-16 DIAGNOSIS — R351 Nocturia: Secondary | ICD-10-CM

## 2022-08-16 MED ORDER — MIRABEGRON ER 25 MG PO TB24: 25.0000 mg | ORAL_TABLET | Freq: Every day | ORAL | 11 refills | Status: DC

## 2022-08-16 NOTE — Telephone Encounter (Signed)
Patient is requesting Laura [name] in place of [facility] due to insurance

## 2022-08-17 ENCOUNTER — Ambulatory Visit (HOSPITAL_BASED_OUTPATIENT_CLINIC_OR_DEPARTMENT_OTHER)
Admission: RE | Admit: 2022-08-17 | Discharge: 2022-08-17 | Disposition: A | Discharge status: Home / Self Care | Payer: BC Managed Care – PPO | Source: Ambulatory Visit | Attending: Urology | Admitting: Urology

## 2022-08-17 ENCOUNTER — Other Ambulatory Visit: Payer: Self-pay

## 2022-08-17 DIAGNOSIS — R350 Frequency of micturition: Secondary | ICD-10-CM

## 2022-08-17 DIAGNOSIS — R3129 Other microscopic hematuria: Secondary | ICD-10-CM | POA: Diagnosis not present

## 2022-08-17 DIAGNOSIS — R351 Nocturia: Secondary | ICD-10-CM

## 2022-08-17 DIAGNOSIS — R319 Hematuria, unspecified: Secondary | ICD-10-CM | POA: Diagnosis not present

## 2022-08-17 MED ORDER — IOHEXOL 300 MG/ML  SOLN
100.0000 mL | Freq: Once | INTRAMUSCULAR | Status: AC | PRN
  Administered 2022-08-17: 125 mL via INTRAVENOUS

## 2022-08-17 MED ORDER — MIRABEGRON ER 25 MG PO TB24: 25.0000 mg | ORAL_TABLET | Freq: Every day | ORAL | 11 refills | Status: DC

## 2022-08-20 DIAGNOSIS — Z1231 Encounter for screening mammogram for malignant neoplasm of breast: Secondary | ICD-10-CM | POA: Diagnosis not present

## 2022-08-21 ENCOUNTER — Encounter: Payer: Self-pay | Admitting: Urology

## 2022-08-21 ENCOUNTER — Ambulatory Visit: Payer: BC Managed Care – PPO | Admitting: Urology

## 2022-08-21 VITALS — BP 159/101 | HR 94 | Ht 60.0 in | Wt 230.0 lb

## 2022-08-21 DIAGNOSIS — R3129 Other microscopic hematuria: Secondary | ICD-10-CM

## 2022-08-21 DIAGNOSIS — R351 Nocturia: Secondary | ICD-10-CM

## 2022-08-21 DIAGNOSIS — R350 Frequency of micturition: Secondary | ICD-10-CM

## 2022-08-21 LAB — URINALYSIS, ROUTINE W REFLEX MICROSCOPIC
Bilirubin, UA: NEGATIVE
Glucose, UA: NEGATIVE
Ketones, UA: NEGATIVE
Nitrite, UA: NEGATIVE
Protein,UA: NEGATIVE
Specific Gravity, UA: 1.025 (ref 1.005–1.030)
Urobilinogen, Ur: 0.2 mg/dL (ref 0.2–1.0)
pH, UA: 5.5 (ref 5.0–7.5)

## 2022-08-21 LAB — MICROSCOPIC EXAMINATION
Crystal Type: NONE SEEN
Crystals: NONE SEEN
Epithelial Cells (non renal): >10 /hpf — AB (ref 0–10)
Renal Epithel, UA: NONE SEEN /hpf
Trichomonas, UA: NONE SEEN
Yeast, UA: NONE SEEN

## 2022-08-21 MED ORDER — CIPROFLOXACIN HCL 500 MG PO TABS
500.0000 mg | ORAL_TABLET | Freq: Once | ORAL | Status: AC
  Administered 2022-08-21: 500 mg via ORAL

## 2022-08-21 NOTE — Progress Notes (Signed)
Assessment: 1. Microscopic hematuria   2. Urinary frequency   3. Nocturia     Plan: I personally reviewed the CT study from 08/17/2022 with results as noted below.  Results discussed with the patient. No evidence for serious or life-threatening cause for the microscopic hematuria. Cipro x 1 following cystoscopy. Continue bladder diet Continue Myrbetriq 25 mg qHS.  Return to office in 6 months.  Chief Complaint:  Chief Complaint  Patient presents with   Cysto   History of Present Illness:  Laura [name] is a 47 y.o. female who is seen for further evaluation of microscopic hematuria, nocturia and urinary frequency.   At her initial visit in 1/24, she reported symptoms of urinary frequency and nocturia for approximately 3 years.  She had some recent increase in her symptoms within the past year.  She was voiding approximately every 3 hours during the day and had nocturia every 30-60 minutes.  She also reported urinary urgency.  No urinary incontinence.  She was voiding with a good stream and felt like she emptied her bladder completely.  No dysuria or gross hematuria.  She was previously given a trial of Vesicare in May 2023.  She discontinued the medicine after 1 month due to lack of benefit.  No other medical therapy had been tried.  She has not had any recent UTIs.  No problems with constipation or diarrhea.  No fecal incontinence.  Urine culture from 3/22: 10-49 K group B strep Urine culture from 11/21: 50-100 K E. Coli Urinalysis from 06/30/2022 showed no WBCs, 11-30 RBCs, >10 epithelial cells, no bacteria. She was instructed on a bladder diet and given a trial of Myrbetriq 25 mg nightly for her frequency and nocturia. She noted improvement in her nocturia with the Myrbetriq 25 mg.  Her nocturia decreased from 8 times per night to 3-4 times per night.  Her daytime frequency improved as well.  No side effects from the medication.  No dysuria or gross hematuria.  No flank  pain.  She presents today for further relation with cystoscopy for microscopic hematuria. 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  Portions of the above documentation were copied from a prior visit for review purposes only.   Past Medical History:  Past Medical History:  Diagnosis Date   ALLERGIC RHINITIS 06/01/2008   Anemia    Anxiety 08/15/2010   ASTHMA 06/01/2008   Asthma    BURSITIS, LEFT HIP 04/28/2010   GERD (gastroesophageal reflux disease)    GOITER, MULTINODULAR 06/28/2008   Hirsutism 06/02/2009   HYPERTENSION 06/01/2008   Palpitations 07/25/2010   PERIPHERAL EDEMA 04/28/2010   Pharyngitis, acute 10/09/2010   TONSILLITIS, RECURRENT 05/05/2009    Past Surgical History:  Past Surgical History:  Procedure Laterality Date   bilateral turbiante reductions     CESAREAN SECTION  2007   DILATATION & CURETTAGE/HYSTEROSCOPY WITH MYOSURE N/A 07/22/2018   Procedure: DILATATION & CURETTAGE/HYSTEROSCOPY WITH MYOSURE XL;  Surgeon: Servando Salina, MD;  Location: [facility];  Service: Gynecology;  Laterality: N/A;   s/p sinus surgury  2006    Allergies:  No Known Allergies  Family History:  Family History  Problem Relation Age of Onset   Hypertension Mother    Hyperlipidemia Mother    Hypertension Maternal Grandmother    Hyperlipidemia Maternal Grandmother    Hypertension Maternal Grandfather  Hyperlipidemia Maternal Grandfather    Lung cancer Maternal Grandfather     Social History:  Social History   Tobacco Use   Smoking status: Never   Smokeless tobacco: Never  Vaping Use   Vaping Use: Never used  Substance Use Topics   Alcohol use: No   Drug use: No    ROS: Constitutional:  Negative for fever, chills, weight loss CV: Negative for chest pain, previous MI, hypertension Respiratory:  Negative  for shortness of breath, wheezing, sleep apnea, frequent cough GI:  Negative for nausea, vomiting, bloody stool, GERD  Physical exam: BP (!) 159/101   Pulse 94   Ht 5' (1.524 m)   Wt 230 lb (104.3 kg)   BMI 44.92 kg/m  GENERAL APPEARANCE:  Well appearing, well developed, well nourished, NAD HEENT:  Atraumatic, normocephalic, oropharynx clear NECK:  Supple without lymphadenopathy or thyromegaly ABDOMEN:  Soft, non-tender, no masses EXTREMITIES:  Moves all extremities well, without clubbing, cyanosis, or edema NEUROLOGIC:  Alert and oriented x 3, normal gait, CN II-XII grossly intact MENTAL STATUS:  appropriate BACK:  Non-tender to palpation, No CVAT SKIN:  Warm, dry, and intact  Results: U/A: 0-5 WBC, 3-10 RBC, >10 epithelial cells, few bacteria  CYSTOSCOPY  Procedure: Flexible cystoscopy  Pre-Operative Diagnosis:  Microscopic hematuria  Post-Operative Diagnosis: Microscopic hematuria  Anesthesia: local with lidocaine gel  Surgical Narrative:  After appropriate informed consent was obtained, the patient was prepped and draped in the usual sterile fashion in the supine position. She was correctly identified and the proper procedure delineated prior to proceeding. Sterile lidocaine gel was instilled in the urethra.  The flexible cystoscope was introduced without difficulty.  Findings:  Urethra: Normal  Bladder: Normal  Ureteral orifices: normal  Additional findings: None  A bladder wash was not obtained for cytology.  Pelvic exam was not positive for pelvic prolapse.  She tolerated the procedure well.  A chaperone was present throughout the procedure.

## 2022-10-21 ENCOUNTER — Other Ambulatory Visit: Payer: Self-pay | Admitting: Internal Medicine

## 2022-10-23 ENCOUNTER — Other Ambulatory Visit: Payer: Self-pay

## 2022-11-12 ENCOUNTER — Other Ambulatory Visit: Payer: Self-pay

## 2022-11-12 ENCOUNTER — Other Ambulatory Visit: Payer: Self-pay | Admitting: Internal Medicine

## 2022-11-23 DIAGNOSIS — H40013 Open angle with borderline findings, low risk, bilateral: Secondary | ICD-10-CM | POA: Diagnosis not present

## 2022-12-16 ENCOUNTER — Other Ambulatory Visit: Payer: Self-pay | Admitting: Internal Medicine

## 2022-12-17 ENCOUNTER — Other Ambulatory Visit: Payer: Self-pay

## 2023-01-15 ENCOUNTER — Other Ambulatory Visit: Payer: Self-pay | Admitting: Internal Medicine

## 2023-01-21 ENCOUNTER — Ambulatory Visit: Payer: BC Managed Care – PPO | Admitting: Internal Medicine

## 2023-01-21 VITALS — BP 130/78 | HR 95 | Temp 98.9°F | Ht 60.0 in | Wt 237.0 lb

## 2023-01-21 DIAGNOSIS — Z0001 Encounter for general adult medical examination with abnormal findings: Secondary | ICD-10-CM

## 2023-01-21 DIAGNOSIS — D509 Iron deficiency anemia, unspecified: Secondary | ICD-10-CM

## 2023-01-21 DIAGNOSIS — Z0000 Encounter for general adult medical examination without abnormal findings: Secondary | ICD-10-CM | POA: Diagnosis not present

## 2023-01-21 DIAGNOSIS — E559 Vitamin D deficiency, unspecified: Secondary | ICD-10-CM | POA: Diagnosis not present

## 2023-01-21 DIAGNOSIS — E538 Deficiency of other specified B group vitamins: Secondary | ICD-10-CM

## 2023-01-21 DIAGNOSIS — K219 Gastro-esophageal reflux disease without esophagitis: Secondary | ICD-10-CM

## 2023-01-21 DIAGNOSIS — R7303 Prediabetes: Secondary | ICD-10-CM | POA: Diagnosis not present

## 2023-01-21 DIAGNOSIS — J45909 Unspecified asthma, uncomplicated: Secondary | ICD-10-CM

## 2023-01-21 DIAGNOSIS — Z23 Encounter for immunization: Secondary | ICD-10-CM | POA: Diagnosis not present

## 2023-01-21 DIAGNOSIS — I10 Essential (primary) hypertension: Secondary | ICD-10-CM

## 2023-01-21 DIAGNOSIS — Z1211 Encounter for screening for malignant neoplasm of colon: Secondary | ICD-10-CM

## 2023-01-21 LAB — BASIC METABOLIC PANEL
BUN: 11 mg/dL (ref 6–23)
CO2: 27 meq/L (ref 19–32)
Calcium: 9.4 mg/dL (ref 8.4–10.5)
Chloride: 103 meq/L (ref 96–112)
Creatinine, Ser: 0.72 mg/dL (ref 0.40–1.20)
GFR: 99.3 mL/min (ref >60.00)
Glucose, Bld: 120 mg/dL — ABNORMAL HIGH (ref 70–99)
Potassium: 3.9 meq/L (ref 3.5–5.1)
Sodium: 136 meq/L (ref 135–145)

## 2023-01-21 LAB — LIPID PANEL
Cholesterol: 188 mg/dL (ref 0–200)
HDL: 47.4 mg/dL (ref >39.00)
LDL Cholesterol: 118 mg/dL — ABNORMAL HIGH (ref 0–99)
NonHDL: 140.86
Total CHOL/HDL Ratio: 4
Triglycerides: 115 mg/dL (ref 0.0–149.0)
VLDL: 23 mg/dL (ref 0.0–40.0)

## 2023-01-21 LAB — CBC WITH DIFFERENTIAL/PLATELET
Basophils Absolute: 0 10*3/uL (ref 0.0–0.1)
Basophils Relative: 0.7 % (ref 0.0–3.0)
Eosinophils Absolute: 0.2 10*3/uL (ref 0.0–0.7)
Eosinophils Relative: 2.8 % (ref 0.0–5.0)
HCT: 43 % (ref 36.0–46.0)
Hemoglobin: 14 g/dL (ref 12.0–15.0)
Lymphocytes Relative: 26.6 % (ref 12.0–46.0)
Lymphs Abs: 1.6 10*3/uL (ref 0.7–4.0)
MCHC: 32.5 g/dL (ref 30.0–36.0)
MCV: 95.9 fl (ref 78.0–100.0)
Monocytes Absolute: 0.7 10*3/uL (ref 0.1–1.0)
Monocytes Relative: 11.8 % (ref 3.0–12.0)
Neutro Abs: 3.6 10*3/uL (ref 1.4–7.7)
Neutrophils Relative %: 58.1 % (ref 43.0–77.0)
Platelets: 204 10*3/uL (ref 150.0–400.0)
RBC: 4.48 Mil/uL (ref 3.87–5.11)
RDW: 12.8 % (ref 11.5–15.5)
WBC: 6.1 10*3/uL (ref 4.0–10.5)

## 2023-01-21 LAB — HEPATIC FUNCTION PANEL
ALT: 18 U/L (ref 0–35)
AST: 13 U/L (ref 0–37)
Albumin: 4.1 g/dL (ref 3.5–5.2)
Alkaline Phosphatase: 55 U/L (ref 39–117)
Bilirubin, Direct: 0.1 mg/dL (ref 0.0–0.3)
Total Bilirubin: 0.3 mg/dL (ref 0.2–1.2)
Total Protein: 7.1 g/dL (ref 6.0–8.3)

## 2023-01-21 LAB — HEMOGLOBIN A1C: Hgb A1c MFr Bld: 7.6 % — ABNORMAL HIGH (ref 4.6–6.5)

## 2023-01-21 MED ORDER — LOSARTAN POTASSIUM 100 MG PO TABS: 100.0000 mg | ORAL_TABLET | Freq: Every day | ORAL | 3 refills | Status: DC

## 2023-01-21 MED ORDER — HYDRALAZINE HCL 25 MG PO TABS: 25.0000 mg | ORAL_TABLET | Freq: Three times a day (TID) | ORAL | 3 refills | Status: DC

## 2023-01-21 MED ORDER — METOPROLOL SUCCINATE ER 100 MG PO TB24: 100.0000 mg | ORAL_TABLET | Freq: Every day | ORAL | 3 refills | Status: DC

## 2023-01-21 NOTE — Assessment & Plan Note (Signed)
Age and sex appropriate education and counseling updated with regular exercise and diet Referrals for preventative services - for colonoscopy Immunizations addressed - for tdap at pharmacy, for flu shot today Smoking counseling  - none needed Evidence for depression or other mood disorder - none significant Most recent labs reviewed. I have personally reviewed and have noted: 1) the patient's medical and social history 2) The patient's current medications and supplements 3) The patient's height, weight, and BMI have been recorded in the chart

## 2023-01-21 NOTE — Assessment & Plan Note (Signed)
Stable overall, for iron level with labs

## 2023-01-21 NOTE — Assessment & Plan Note (Signed)
BP Readings from Last 3 Encounters:  01/21/23 130/78  08/21/22 (!) 159/101  07/13/22 (!) 154/89   uncontrolled, pt to continue medical treatment losartan 100 every day, toprol xl 100 every day, and add hydralazine 25 tid

## 2023-01-21 NOTE — Assessment & Plan Note (Signed)
Stable overall, cont inhaler prn 

## 2023-01-21 NOTE — Progress Notes (Signed)
Patient ID: Laura [name], female   DOB: Mar 08, 1976, 47 y.o.   MRN: [id number]         Chief Complaint:: wellness exam and Medical Management of Chronic Issues (refills)  , htn, asthma, gerd, iron def anemia, preDM, vit d deficiency       HPI:  Laura [name] is a 47 y.o. female here for wellness exam; for tdap at pharmacy, for flu shot today, due for colonoscopy, o/w up to date                        Also Pt denies chest pain, increased sob or doe, wheezing, orthopnea, PND, increased LE swelling, palpitations, dizziness or syncope.   Pt denies polydipsia, polyuria, or new focal neuro s/s.    Pt denies fever, wt loss, night sweats, loss of appetite, or other constitutional symptoms  BP has been elevated at home as well.  Pt would like to start ozempic if A1c is eleavted.  Also has some wt gain and hypersomnia, but hoping to lose wt without eval for sleep apnea.  No overt bleeding or bruising.  Denies worsening reflux, abd pain, dysphagia, n/v, bowel change or blood.    Wt Readings from Last 3 Encounters:  01/21/23 237 lb (107.5 kg)  08/21/22 230 lb (104.3 kg)  07/13/22 230 lb (104.3 kg)   BP Readings from Last 3 Encounters:  01/21/23 130/78  08/21/22 (!) 159/101  07/13/22 (!) 154/89   Immunization History  Administered Date(s) Administered   Hep A / Hep B 07/31/2010, 08/31/2010, 01/30/2011   Influenza Whole 03/09/2009, 04/28/2010   Influenza, Seasonal, Injecte, Preservative Fre 01/21/2023   Influenza,inj,Quad PF,6+ Mos 08/15/2018, 03/28/2020   Influenza-Unspecified 02/26/2012   PFIZER(Purple Top)SARS-COV-2 Vaccination 08/07/2019, 08/28/2019   Td 10/26/2008   Health Maintenance Due  Topic Date Due   DTaP/Tdap/Td (2 - Tdap) 10/27/2018   Colonoscopy  Never done      Past Medical History:  Diagnosis Date   ALLERGIC RHINITIS 06/01/2008   Anemia    Anxiety 08/15/2010   ASTHMA 06/01/2008   Asthma    BURSITIS, LEFT HIP 04/28/2010   GERD (gastroesophageal reflux disease)     GOITER, MULTINODULAR 06/28/2008   Hirsutism 06/02/2009   HYPERTENSION 06/01/2008   Palpitations 07/25/2010   PERIPHERAL EDEMA 04/28/2010   Pharyngitis, acute 10/09/2010   TONSILLITIS, RECURRENT 05/05/2009   Past Surgical History:  Procedure Laterality Date   bilateral turbiante reductions     CESAREAN SECTION  2007   DILATATION & CURETTAGE/HYSTEROSCOPY WITH MYOSURE N/A 07/22/2018   Procedure: DILATATION & CURETTAGE/HYSTEROSCOPY WITH MYOSURE XL;  Surgeon: Maxie Better, MD;  Location: [facility];  Service: Gynecology;  Laterality: N/A;   s/p sinus surgury  2006    reports that she has never smoked. She has never used smokeless tobacco. She reports that she does not drink alcohol and does not use drugs. family history includes Hyperlipidemia in her maternal grandfather, maternal grandmother, and mother; Hypertension in her maternal grandfather, maternal grandmother, and mother; Lung cancer in her maternal grandfather. No Known Allergies Current Outpatient Medications on File Prior to Visit  Medication Sig Dispense Refill   Ferrous Sulfate (FEOSOL PO) Take 1 tablet by mouth daily.     mirabegron ER (MYRBETRIQ) 25 MG TB24 tablet Take 1 tablet (25 mg total) by mouth daily. 30 tablet 11   No current facility-administered medications on file prior to visit.        ROS:  All others  reviewed and negative.  Objective        PE:  BP 130/78 (BP Location: Right Arm, Patient Position: Sitting, Cuff Size: Normal)   Pulse 95   Temp 98.9 F (37.2 C) (Oral)   Ht 5' (1.524 m)   Wt 237 lb (107.5 kg)   SpO2 99%   BMI 46.29 kg/m                 Constitutional: Pt appears in NAD               HENT: Head: NCAT.                Right Ear: External ear normal.                 Left Ear: External ear normal.                Eyes: . Pupils are equal, round, and reactive to light. Conjunctivae and EOM are normal               Nose: without d/c or deformity               Neck: Neck supple.  Gross normal ROM               Cardiovascular: Normal rate and regular rhythm.                 Pulmonary/Chest: Effort normal and breath sounds without rales or wheezing.                Abd:  Soft, NT, ND, + BS, no organomegaly               Neurological: Pt is alert. At baseline orientation, motor grossly intact               Skin: Skin is warm. No rashes, no other new lesions, LE edema - none               Psychiatric: Pt behavior is normal without agitation   Micro: none  Cardiac tracings I have personally interpreted today:  none  Pertinent Radiological findings (summarize): none   Lab Results  Component Value Date   WBC 6.2 03/28/2020   HGB 14.4 03/28/2020   HCT 42.2 03/28/2020   PLT 224.0 03/28/2020   GLUCOSE 91 08/12/2020   CHOL 155 03/28/2020   TRIG 88.0 03/28/2020   HDL 49.50 03/28/2020   LDLCALC 87 03/28/2020   ALT 21 03/28/2020   AST 16 03/28/2020   NA 137 08/12/2020   K 3.9 08/12/2020   CL 104 08/12/2020   CREATININE 0.66 08/12/2020   BUN 11 08/12/2020   CO2 26 08/12/2020   TSH 1.29 03/28/2020   HGBA1C 5.6 08/12/2020   Assessment/Plan:  Laura [name] is a 47 y.o. Black or African American [2] female with  has a past medical history of ALLERGIC RHINITIS (06/01/2008), Anemia, Anxiety (08/15/2010), ASTHMA (06/01/2008), Asthma, BURSITIS, LEFT HIP (04/28/2010), GERD (gastroesophageal reflux disease), GOITER, MULTINODULAR (06/28/2008), Hirsutism (06/02/2009), HYPERTENSION (06/01/2008), Palpitations (07/25/2010), PERIPHERAL EDEMA (04/28/2010), Pharyngitis, acute (10/09/2010), and TONSILLITIS, RECURRENT (05/05/2009).  Encounter for well adult exam with abnormal findings Age and sex appropriate education and counseling updated with regular exercise and diet Referrals for preventative services - for colonoscopy Immunizations addressed - for tdap at pharmacy, for flu shot today Smoking counseling  - none needed Evidence for depression or other mood disorder - none  significant Most recent labs reviewed. I have personally  reviewed and have noted: 1) the patient's medical and social history 2) The patient's current medications and supplements 3) The patient's height, weight, and BMI have been recorded in the chart   Asthma Stable overall, cont inhaler prn  Essential hypertension BP Readings from Last 3 Encounters:  01/21/23 130/78  08/21/22 (!) 159/101  07/13/22 (!) 154/89   uncontrolled, pt to continue medical treatment losartan 100 every day, toprol xl 100 every day, and add hydralazine 25 tid  GERD (gastroesophageal reflux disease) Stable overall, for antireflux diet  Iron deficiency anemia Stable overall, for iron level with labs  Prediabetes Lab Results  Component Value Date   HGBA1C 5.6 08/12/2020   Stable, pt to continue current medical treatment  - diet, wt control   Vitamin D deficiency Last vitamin D Lab Results  Component Value Date   VD25OH 28.45 (L) 08/12/2020   Low, to staart oral replacement  Followup: Return in about 6 months (around 07/24/2023).  Oliver Barre, MD 01/21/2023 9:32 PM [facility]

## 2023-01-21 NOTE — Patient Instructions (Addendum)
You had the flu shot today  You will be contacted regarding the referral for: colonoscopy  Please take all new medication as prescribed -the hydralazine  Please continue all other medications as before, and refills have been done if requested.  Please have the pharmacy call with any other refills you may need.  Please continue your efforts at being more active, low cholesterol diet, and weight control.  You are otherwise up to date with prevention measures today.  Please keep your appointments with your specialists as you may have planned  Please go to the LAB at the blood drawing area for the tests to be done  You will be contacted by phone if any changes need to be made immediately.  Otherwise, you will receive a letter about your results with an explanation, but please check with MyChart first.  Please make an Appointment to return in 6 months, or sooner if needed

## 2023-01-21 NOTE — Assessment & Plan Note (Signed)
Last vitamin D Lab Results  Component Value Date   VD25OH 28.45 (L) 08/12/2020   Low, to staart oral replacement

## 2023-01-21 NOTE — Assessment & Plan Note (Signed)
Stable overall, for antireflux diet

## 2023-01-21 NOTE — Assessment & Plan Note (Signed)
Lab Results  Component Value Date   HGBA1C 5.6 08/12/2020   Stable, pt to continue current medical treatment  - diet, wt control

## 2023-01-22 ENCOUNTER — Other Ambulatory Visit: Payer: Self-pay | Admitting: Internal Medicine

## 2023-01-22 DIAGNOSIS — Z01419 Encounter for gynecological examination (general) (routine) without abnormal findings: Secondary | ICD-10-CM | POA: Diagnosis not present

## 2023-01-22 LAB — TSH: TSH: 0.96 u[IU]/mL (ref 0.35–5.50)

## 2023-01-22 LAB — URINALYSIS, ROUTINE W REFLEX MICROSCOPIC
Bilirubin Urine: NEGATIVE
Ketones, ur: NEGATIVE
Leukocytes,Ua: NEGATIVE
Nitrite: NEGATIVE
Specific Gravity, Urine: 1.02 (ref 1.000–1.030)
Total Protein, Urine: NEGATIVE
Urine Glucose: NEGATIVE
Urobilinogen, UA: 0.2 (ref 0.0–1.0)
pH: 6 (ref 5.0–8.0)

## 2023-01-22 LAB — VITAMIN D 25 HYDROXY (VIT D DEFICIENCY, FRACTURES): VITD: 26.82 ng/mL — ABNORMAL LOW (ref 30.00–100.00)

## 2023-01-22 LAB — VITAMIN B12: Vitamin B-12: 320 pg/mL (ref 211–911)

## 2023-01-22 MED ORDER — OZEMPIC (0.25 OR 0.5 MG/DOSE) 2 MG/3ML ~~LOC~~ SOPN: PEN_INJECTOR | SUBCUTANEOUS | 11 refills | Status: DC

## 2023-02-11 ENCOUNTER — Encounter: Payer: Self-pay | Admitting: Internal Medicine

## 2023-02-11 DIAGNOSIS — E1165 Type 2 diabetes mellitus with hyperglycemia: Secondary | ICD-10-CM

## 2023-02-11 MED ORDER — OZEMPIC (0.25 OR 0.5 MG/DOSE) 2 MG/3ML ~~LOC~~ SOPN: PEN_INJECTOR | SUBCUTANEOUS | 11 refills | Status: DC

## 2023-02-18 ENCOUNTER — Telehealth: Payer: Self-pay

## 2023-02-19 ENCOUNTER — Telehealth: Payer: Self-pay

## 2023-02-19 ENCOUNTER — Other Ambulatory Visit (HOSPITAL_COMMUNITY): Payer: Self-pay

## 2023-02-19 NOTE — Telephone Encounter (Signed)
Pharmacy Patient Advocate Encounter   Received notification from Pt Calls Messages that prior authorization for Ozempic 2mg /1ml is required/requested.   Insurance verification completed.   The patient is insured through [employer] .   Per test claim: PA required; PA submitted to [facility] via CoverMyMeds Key/confirmation #/EOC [facility] Status is pending

## 2023-02-21 ENCOUNTER — Ambulatory Visit: Payer: BC Managed Care – PPO | Admitting: Urology

## 2023-02-21 DIAGNOSIS — E119 Type 2 diabetes mellitus without complications: Secondary | ICD-10-CM | POA: Insufficient documentation

## 2023-02-21 MED ORDER — OZEMPIC (0.25 OR 0.5 MG/DOSE) 2 MG/3ML ~~LOC~~ SOPN: PEN_INJECTOR | SUBCUTANEOUS | 11 refills | Status: DC

## 2023-02-21 NOTE — Telephone Encounter (Signed)
Pharmacy Patient Advocate Encounter  Received notification from [facility] that Prior Authorization for Ozempic 2mg /8ml has been DENIED.  See denial reason below. No denial letter attached in CMM. Will attache denial letter to Media tab once received.   PA #/Case ID/Reference #: [id number]

## 2023-02-21 NOTE — Telephone Encounter (Signed)
Ok this is done  Ok to [employer]

## 2023-02-21 NOTE — Addendum Note (Signed)
Addended by: Corwin Levins on: 02/21/2023 12:06 PM   Modules accepted: Orders

## 2023-02-22 ENCOUNTER — Telehealth: Payer: Self-pay

## 2023-02-22 ENCOUNTER — Other Ambulatory Visit (HOSPITAL_COMMUNITY): Payer: Self-pay

## 2023-02-22 NOTE — Telephone Encounter (Signed)
Pharmacy Patient Advocate Encounter   Received notification from Pt Calls Messages that prior authorization for Ozempic 2mg /7ml is required/requested.   Insurance verification completed.   The patient is insured through [facility] .   Per test claim: PA required; PA submitted to [facility] via CoverMyMeds Key/confirmation #/EOC  [id number] Status is pending

## 2023-02-25 NOTE — Telephone Encounter (Signed)
Received notification additional information is needed for the prior authorization. Faxed over diagnosis to [phone].

## 2023-02-28 NOTE — Telephone Encounter (Signed)
Pharmacy Patient Advocate Encounter  Received notification from [facility]  that Prior Authorization for Franki Monte has been DENIED.  Full denial letter will be uploaded to the media tab. See denial reason below.   PA #/Case ID/Reference #: [id number]

## 2023-02-28 NOTE — Telephone Encounter (Signed)
Ok noted  

## 2023-04-02 ENCOUNTER — Ambulatory Visit (AMBULATORY_SURGERY_CENTER): Payer: BC Managed Care – PPO

## 2023-04-02 ENCOUNTER — Encounter: Payer: Self-pay | Admitting: Gastroenterology

## 2023-04-02 VITALS — Ht 60.0 in | Wt 219.0 lb

## 2023-04-02 DIAGNOSIS — Z1211 Encounter for screening for malignant neoplasm of colon: Secondary | ICD-10-CM

## 2023-04-02 MED ORDER — NA SULFATE-K SULFATE-MG SULF 17.5-3.13-1.6 GM/177ML PO SOLN: 1.0000 | Freq: Once | ORAL | 0 refills | Status: AC

## 2023-04-02 MED ORDER — ONDANSETRON HCL 4 MG PO TABS: 4.0000 mg | ORAL_TABLET | ORAL | 0 refills | Status: DC

## 2023-04-02 NOTE — Progress Notes (Signed)

## 2023-04-14 ENCOUNTER — Encounter: Payer: Self-pay | Admitting: Certified Registered Nurse Anesthetist

## 2023-04-18 ENCOUNTER — Ambulatory Visit: Payer: BC Managed Care – PPO | Admitting: Gastroenterology

## 2023-04-18 ENCOUNTER — Encounter: Payer: Self-pay | Admitting: Gastroenterology

## 2023-04-18 VITALS — BP 142/80 | HR 83 | Temp 97.3°F | Resp 18 | Ht 60.0 in | Wt 219.0 lb

## 2023-04-18 DIAGNOSIS — Z1211 Encounter for screening for malignant neoplasm of colon: Secondary | ICD-10-CM

## 2023-04-18 DIAGNOSIS — D125 Benign neoplasm of sigmoid colon: Secondary | ICD-10-CM

## 2023-04-18 DIAGNOSIS — K635 Polyp of colon: Secondary | ICD-10-CM

## 2023-04-18 DIAGNOSIS — D122 Benign neoplasm of ascending colon: Secondary | ICD-10-CM

## 2023-04-18 HISTORY — DX: Morbid (severe) obesity due to excess calories: E66.01

## 2023-04-18 MED ORDER — SODIUM CHLORIDE 0.9 % IV SOLN: 500.0000 mL | INTRAVENOUS | Status: DC

## 2023-04-18 NOTE — Progress Notes (Signed)
Report given to PACU, vss 

## 2023-04-18 NOTE — Progress Notes (Signed)
Called to room to assist during endoscopic procedure.  Patient ID and intended procedure confirmed with present staff. Received instructions for my participation in the procedure from the performing physician.  

## 2023-04-18 NOTE — Progress Notes (Signed)
History and Physical:  This patient presents for endoscopic testing for: Encounter Diagnosis  Name Primary?   Special screening for malignant neoplasms, colon Yes    47 year old woman here today for her for screening colonoscopy. Patient denies chronic abdominal pain, rectal bleeding, constipation or diarrhea.   Patient is otherwise without complaints or active issues today.   Past Medical History: Past Medical History:  Diagnosis Date   ALLERGIC RHINITIS 06/01/2008   Anemia    Anxiety 08/15/2010   ASTHMA 06/01/2008   Asthma    BURSITIS, LEFT HIP 04/28/2010   Diabetes mellitus without complication (HCC)    GERD (gastroesophageal reflux disease)    GOITER, MULTINODULAR 06/28/2008   Hirsutism 06/02/2009   HYPERTENSION 06/01/2008   Palpitations 07/25/2010   PERIPHERAL EDEMA 04/28/2010   Pharyngitis, acute 10/09/2010   TONSILLITIS, RECURRENT 05/05/2009     Past Surgical History: Past Surgical History:  Procedure Laterality Date   bilateral turbiante reductions     CESAREAN SECTION  2007   DILATATION & CURETTAGE/HYSTEROSCOPY WITH MYOSURE N/A 07/22/2018   Procedure: DILATATION & CURETTAGE/HYSTEROSCOPY WITH MYOSURE XL;  Surgeon: Maxie Better, MD;  Location: [facility];  Service: Gynecology;  Laterality: N/A;   s/p sinus surgury  2006    Allergies: No Known Allergies  Outpatient Meds: Current Outpatient Medications  Medication Sig Dispense Refill   calcium-vitamin D (OSCAL WITH D) 500-5 MG-MCG tablet Take 1 tablet by mouth.     losartan (COZAAR) 100 MG tablet Take 1 tablet (100 mg total) by mouth daily. 90 tablet 3   metoprolol succinate (TOPROL-XL) 100 MG 24 hr tablet Take 1 tablet (100 mg total) by mouth daily. Take with or immediately following a meal. 90 tablet 3   Albuterol Sulfate (PROAIR RESPICLICK) 108 (90 Base) MCG/ACT AEPB Take 1 puff by mouth daily as needed (use only as needed).     Ferrous Sulfate (FEOSOL PO) Take 1 tablet by mouth  daily.     ondansetron (ZOFRAN) 4 MG tablet Take 1 tablet (4 mg total) by mouth as directed. Take one Zofran 4 mg tablet 30-60 minutes before each colonoscopy prep dose (Patient not taking: Reported on 04/18/2023) 4 tablet 0   Semaglutide,0.25 or 0.5MG /DOS, (OZEMPIC, 0.25 OR 0.5 MG/DOSE,) 2 MG/3ML SOPN Take 0.50 mg subcutaneous once weekly 3 mL 11   Current Facility-Administered Medications  Medication Dose Route Frequency Provider Last Rate Last Admin   0.9 %  sodium chloride infusion  500 mL Intravenous Continuous Danis, Starr Lake III, MD          ___________________________________________________________________ Objective   Exam:  BP (!) 160/90   Pulse (!) 107   Temp (!) 97.3 F (36.3 C)   Ht 5' (1.524 m)   Wt 219 lb (99.3 kg)   LMP  (LMP Unknown) Comment: Waiver signed  SpO2 100%   BMI 42.77 kg/m   CV: regular , S1/S2 Resp: clear to auscultation bilaterally, normal RR and effort noted GI: soft, no tenderness, with active bowel sounds.   Assessment: Encounter Diagnosis  Name Primary?   Special screening for malignant neoplasms, colon Yes     Plan: Colonoscopy   The benefits and risks of the planned procedure were described in detail with the patient or (when appropriate) their health care proxy.  Risks were outlined as including, but not limited to, bleeding, infection, perforation, adverse medication reaction leading to cardiac or pulmonary decompensation, pancreatitis (if ERCP).  The limitation of incomplete mucosal visualization was also discussed.  No guarantees or  warranties were given.  The patient is appropriate for an endoscopic procedure in the ambulatory setting.   - Amada Jupiter, MD

## 2023-04-18 NOTE — Op Note (Addendum)
[facility] Patient Name: Laura [name] Procedure Date: 04/18/2023 9:08 AM MRN: [id number] Endoscopist: Sherilyn Cooter L. Myrtie Neither , MD, [id number] Age: 47 Referring MD:  Date of Birth: [birth year] Gender: Female Account #: [id number] Procedure:                Colonoscopy Indications:              Screening for colorectal malignant neoplasm, This                            is the patient's first colonoscopy Medicines:                Monitored Anesthesia Care Procedure:                Pre-Anesthesia Assessment:                           - Prior to the procedure, a History and Physical                            was performed, and patient medications and                            allergies were reviewed. The patient's tolerance of                            previous anesthesia was also reviewed. The risks                            and benefits of the procedure and the sedation                            options and risks were discussed with the patient.                            All questions were answered, and informed consent                            was obtained. Prior Anticoagulants: The patient has                            taken no anticoagulant or antiplatelet agents. ASA                            Grade Assessment: III - A patient with severe                            systemic disease. After reviewing the risks and                            benefits, the patient was deemed in satisfactory                            condition to undergo the procedure.  After obtaining informed consent, the colonoscope                            was passed under direct vision. Throughout the                            procedure, the patient's blood pressure, pulse, and                            oxygen saturations were monitored continuously. The                            CF [id number] #[id number] was introduced through the anus                            and  advanced to the the cecum, identified by                            appendiceal orifice and ileocecal valve. The                            colonoscopy was performed without difficulty. The                            patient tolerated the procedure well. The quality                            of the bowel preparation was excellent. The                            ileocecal valve, appendiceal orifice, and rectum                            were photographed. Scope In: 9:14:48 AM Scope Out: 9:27:12 AM Scope Withdrawal Time: 0 hours 8 minutes 49 seconds  Total Procedure Duration: 0 hours 12 minutes 24 seconds  Findings:                 The perianal and digital rectal examinations were                            normal.                           Repeat examination of right colon under NBI                            performed.                           A diminutive polyp was found in the recto-sigmoid                            colon. The polyp was sessile. The polyp was removed  with a cold snare. Resection and retrieval were                            complete.                           A 5 mm polyp was found in the ascending colon. The                            polyp was semi-sessile. The polyp was removed with                            a cold snare. Resection and retrieval were complete.                           The exam was otherwise without abnormality on                            direct and retroflexion views. Complications:            No immediate complications. Estimated Blood Loss:     Estimated blood loss was minimal. Impression:               - One diminutive polyp at the recto-sigmoid colon,                            removed with a cold snare. Resected and retrieved.                           - One 5 mm polyp in the ascending colon, removed                            with a cold snare. Resected and retrieved.                           - The  examination was otherwise normal on direct                            and retroflexion views. Recommendation:           - Patient has a contact number available for                            emergencies. The signs and symptoms of potential                            delayed complications were discussed with the                            patient. Return to normal activities tomorrow.                            Written discharge instructions were provided to the  patient.                           - Resume previous diet.                           - Continue present medications.                           - Await pathology results.                           - Repeat colonoscopy is recommended for                            surveillance. The colonoscopy date will be                            determined after pathology results from today's                            exam become available for review. Glenetta Kiger L. Myrtie Neither, MD 04/18/2023 9:30:42 AM This report has been signed electronically.

## 2023-04-18 NOTE — Progress Notes (Signed)
Pt's states no medical or surgical changes since previsit or office visit. 

## 2023-04-18 NOTE — Patient Instructions (Signed)
Resume previous diet. Continue present medications. Await pathology results. Repeat colonoscopy is recommended for surveillance. The colonoscopy date will be determined after pathology results from today's exam become available for review.  YOU HAD AN ENDOSCOPIC PROCEDURE TODAY AT THE [facility]:   Refer to the procedure report that was given to you for any specific questions about what was found during the examination.  If the procedure report does not answer your questions, please call your gastroenterologist to clarify.  If you requested that your care partner not be given the details of your procedure findings, then the procedure report has been included in a sealed envelope for you to review at your convenience later.  YOU SHOULD EXPECT: Some feelings of bloating in the abdomen. Passage of more gas than usual.  Walking can help get rid of the air that was put into your GI tract during the procedure and reduce the bloating. If you had a lower endoscopy (such as a colonoscopy or flexible sigmoidoscopy) you may notice spotting of blood in your stool or on the toilet paper. If you underwent a bowel prep for your procedure, you may not have a normal bowel movement for a few days.  Please Note:  You might notice some irritation and congestion in your nose or some drainage.  This is from the oxygen used during your procedure.  There is no need for concern and it should clear up in a day or so.  SYMPTOMS TO REPORT IMMEDIATELY:  Following lower endoscopy (colonoscopy or flexible sigmoidoscopy):  Excessive amounts of blood in the stool  Significant tenderness or worsening of abdominal pains  Swelling of the abdomen that is new, acute  Fever of 100F or higher   For urgent or emergent issues, a gastroenterologist can be reached at any hour by calling [phone]. Do not use MyChart messaging for urgent concerns.    DIET:  We do recommend a small meal at first, but then you may  proceed to your regular diet.  Drink plenty of fluids but you should avoid alcoholic beverages for 24 hours.  ACTIVITY:  You should plan to take it easy for the rest of today and you should NOT DRIVE or use heavy machinery until tomorrow (because of the sedation medicines used during the test).    FOLLOW UP: Our staff will call the number listed on your records the next business day following your procedure.  We will call around 7:15- 8:00 am to check on you and address any questions or concerns that you may have regarding the information given to you following your procedure. If we do not reach you, we will leave a message.     If any biopsies were taken you will be contacted by phone or by letter within the next 1-3 weeks.  Please call us at [phone] if you have not heard about the biopsies in 3 weeks.    SIGNATURES/CONFIDENTIALITY: You and/or your care partner have signed paperwork which will be entered into your electronic medical record.  These signatures attest to the fact that that the information above on your After Visit Summary has been reviewed and is understood.  Full responsibility of the confidentiality of this discharge information lies with you and/or your care-partner. 

## 2023-04-19 ENCOUNTER — Telehealth: Payer: Self-pay

## 2023-04-19 NOTE — Telephone Encounter (Signed)
  Follow up Call-     04/18/2023    8:18 AM  Call back number  Post procedure Call Back phone  # [phone]  Permission to leave phone message Yes     Patient questions:  Do you have a fever, pain , or abdominal swelling? No. Pain Score  0 *  Have you tolerated food without any problems? Yes.    Have you been able to return to your normal activities? Yes.    Do you have any questions about your discharge instructions: Diet   No. Medications  No. Follow up visit  No.  Do you have questions or concerns about your Care? No.  Actions: * If pain score is 4 or above: No action needed, pain <4.

## 2023-04-22 DIAGNOSIS — K635 Polyp of colon: Secondary | ICD-10-CM | POA: Diagnosis not present

## 2023-04-26 LAB — SURGICAL PATHOLOGY

## 2023-04-30 ENCOUNTER — Encounter: Payer: Self-pay | Admitting: Gastroenterology

## 2023-05-27 ENCOUNTER — Encounter: Payer: Self-pay | Admitting: Internal Medicine

## 2023-05-27 DIAGNOSIS — E042 Nontoxic multinodular goiter: Secondary | ICD-10-CM

## 2023-06-28 ENCOUNTER — Telehealth: Payer: Self-pay | Admitting: Pharmacy Technician

## 2023-06-28 ENCOUNTER — Other Ambulatory Visit (HOSPITAL_COMMUNITY): Payer: Self-pay

## 2023-06-28 NOTE — Telephone Encounter (Signed)
Pharmacy Patient Advocate Encounter   Received notification from CoverMyMeds that prior authorization for Ozempic (0.25 or 0.5 MG/DOSE) 2MG /3ML pen-injectors is required/requested.   Insurance verification completed.   The patient is insured through [facility] .   Per test claim: PA required; PA submitted to above mentioned insurance via CoverMyMeds Key/confirmation #/EOC [id number] Status is pending

## 2023-07-03 NOTE — Telephone Encounter (Signed)
 Pharmacy Patient Advocate Encounter  Received notification from [employer] that Prior Authorization for Ozempic  (0.25 or 0.5 MG/DOSE) 2MG /3ML pen-injectors  has been CANCELLED due to:     PA #/Case ID/Reference #: [id number]

## 2023-07-22 ENCOUNTER — Other Ambulatory Visit: Payer: Self-pay

## 2023-07-22 DIAGNOSIS — E042 Nontoxic multinodular goiter: Secondary | ICD-10-CM

## 2023-07-25 ENCOUNTER — Other Ambulatory Visit: Payer: BC Managed Care – PPO

## 2023-07-25 DIAGNOSIS — E042 Nontoxic multinodular goiter: Secondary | ICD-10-CM | POA: Diagnosis not present

## 2023-07-25 LAB — TSH: TSH: 1.24 m[IU]/L

## 2023-07-25 LAB — T3, FREE: T3, Free: 3.1 pg/mL (ref 2.3–4.2)

## 2023-07-25 LAB — T4, FREE: Free T4: 1.1 ng/dL (ref 0.8–1.8)

## 2023-07-29 ENCOUNTER — Encounter: Payer: Self-pay | Admitting: "Endocrinology

## 2023-07-29 ENCOUNTER — Ambulatory Visit: Payer: BC Managed Care – PPO | Admitting: "Endocrinology

## 2023-07-29 ENCOUNTER — Encounter: Payer: Self-pay | Admitting: Internal Medicine

## 2023-07-29 VITALS — BP 124/80 | HR 95 | Ht 60.0 in | Wt 228.2 lb

## 2023-07-29 DIAGNOSIS — H04203 Unspecified epiphora, bilateral lacrimal glands: Secondary | ICD-10-CM

## 2023-07-29 DIAGNOSIS — E042 Nontoxic multinodular goiter: Secondary | ICD-10-CM

## 2023-07-29 MED ORDER — METFORMIN HCL ER 500 MG PO TB24: 500.0000 mg | ORAL_TABLET | Freq: Every day | ORAL | 3 refills | Status: DC

## 2023-07-29 NOTE — Progress Notes (Signed)
 Outpatient Endocrinology Note Laura Clarence, MD  07/29/23   Laura [name] 07-11-1975 [id number]  Referring Provider: Corwin Levins, MD Primary Care Provider: Corwin Levins, MD Subjective  No chief complaint on file.   Assessment & Plan  Diagnoses and all orders for this visit:  Multinodular goiter -     US THYROID; Future -     US THYROID   Laura [name] has never been on any thyroid medication. Patient is currently biochemically euthyroid.  Educated on thyroid axis.  No history of FNA for multinodular  Last thyroid U/S in 2019: has multiple nodules, biggest was Left; Mid 1.8 cm x 1.5 x 0.8 cm Ordered repeat thyroid U/S   Morbid obesity Previously benefited from ozempic, but now not covered by insurance Needs to fail metformin before insurance would approve ozempic   Gritty, watery eyes No known graves disease Recommend ruling out sarcoidosis by PCP Recommend seeing an ophthalmologist    I have reviewed current medications, nurse's notes, allergies, vital signs, past medical and surgical history, family medical history, and social history for this encounter. Counseled patient on symptoms, examination findings, lab findings, imaging results, treatment decisions and monitoring and prognosis. The patient understood the recommendations and agrees with the treatment plan. All questions regarding treatment plan were fully answered.   Return in about 2 weeks (around 08/12/2023) for telemedicine.   Laura , MD  07/29/23   I have reviewed current medications, nurse's notes, allergies, vital signs, past medical and surgical history, family medical history, and social history for this encounter. Counseled patient on symptoms, examination findings, lab findings, imaging results, treatment decisions and monitoring and prognosis. The patient understood the recommendations and agrees with the treatment plan. All questions regarding treatment plan were  fully answered.   History of Present Illness Laura [name] is a 48 y.o. year old female who presents to our clinic with multinodular goiter diagnosed around 2013.    Symptoms suggestive of HYPOTHYROIDISM:  fatigue No weight gain No cold intolerance  No constipation  No  Symptoms suggestive of HYPERTHYROIDISM:  weight loss  No heat intolerance No hyperdefecation  No palpitations  No  Compressive symptoms:  dysphagia  No dysphonia  No positional dyspnea (especially with simultaneous arms elevation)  No  Smokes  No On biotin  No Personal history of head/neck surgery/irradiation  No  Never been on a thyroid medication.   Physical Exam  BP 124/80 (BP Location: Right Arm, Patient Position: Sitting)   Pulse 95   Ht 5' (1.524 m)   Wt 228 lb 3.2 oz (103.5 kg)   SpO2 98%   BMI 44.57 kg/m  Constitutional: well developed, well nourished Head: normocephalic, atraumatic, no exophthalmos Eyes: sclera anicteric, no redness Neck: + thyromegaly, no thyroid tenderness; + nodules  Lungs: normal respiratory effort Neurology: alert and oriented, no fine hand tremor Skin: dry, no appreciable rashes Musculoskeletal: no appreciable defects Psychiatric: normal mood and affect  Allergies No Known Allergies  Current Medications Patient's Medications  New Prescriptions   No medications on file  Previous Medications   ALBUTEROL SULFATE (PROAIR RESPICLICK) 108 (90 BASE) MCG/ACT AEPB    Take 1 puff by mouth daily as needed (use only as needed).   CALCIUM-VITAMIN D (OSCAL WITH D) 500-5 MG-MCG TABLET    Take 1 tablet by mouth.   FERROUS SULFATE (FEOSOL PO)    Take 1 tablet by mouth daily.   LOSARTAN (COZAAR) 100 MG TABLET    Take 1  tablet (100 mg total) by mouth daily.   METOPROLOL SUCCINATE (TOPROL-XL) 100 MG 24 HR TABLET    Take 1 tablet (100 mg total) by mouth daily. Take with or immediately following a meal.   ONDANSETRON (ZOFRAN) 4 MG TABLET    Take 1 tablet (4 mg total) by  mouth as directed. Take one Zofran 4 mg tablet 30-60 minutes before each colonoscopy prep dose   SEMAGLUTIDE,0.25 OR 0.5MG /DOS, (OZEMPIC, 0.25 OR 0.5 MG/DOSE,) 2 MG/3ML SOPN    Take 0.50 mg subcutaneous once weekly  Modified Medications   No medications on file  Discontinued Medications   No medications on file    Past Medical History Past Medical History:  Diagnosis Date   ALLERGIC RHINITIS 06/01/2008   Anemia    Anxiety 08/15/2010   ASTHMA 06/01/2008   Asthma    BURSITIS, LEFT HIP 04/28/2010   Diabetes mellitus without complication (HCC)    GERD (gastroesophageal reflux disease)    GOITER, MULTINODULAR 06/28/2008   Hirsutism 06/02/2009   HYPERTENSION 06/01/2008   Morbid (severe) obesity due to excess calories (HCC) 04/18/2023   bmi 42.77   Palpitations 07/25/2010   PERIPHERAL EDEMA 04/28/2010   Pharyngitis, acute 10/09/2010   TONSILLITIS, RECURRENT 05/05/2009    Past Surgical History Past Surgical History:  Procedure Laterality Date   bilateral turbiante reductions     CESAREAN SECTION  2007   DILATATION & CURETTAGE/HYSTEROSCOPY WITH MYOSURE N/A 07/22/2018   Procedure: DILATATION & CURETTAGE/HYSTEROSCOPY WITH MYOSURE XL;  Surgeon: Laura Better, MD;  Location: [facility];  Service: Gynecology;  Laterality: N/A;   s/p sinus surgury  2006    Family History family history includes Hyperlipidemia in her maternal grandfather, maternal grandmother, and mother; Hypertension in her maternal grandfather, maternal grandmother, and mother; Lung cancer in her maternal grandfather.  Social History Social History   Socioeconomic History   Marital status: Married    Spouse name: Laura [name]   Number of children: 1   Years of education: Not on file   Highest education level: Bachelor's degree (e.g., BA, AB, BS)  Occupational History   Occupation: Teacher, adult education: UNEMPLOYED  Tobacco Use   Smoking status: Never   Smokeless tobacco: Never  Vaping  Use   Vaping status: Never Used  Substance and Sexual Activity   Alcohol use: No   Drug use: No   Sexual activity: Not on file  Other Topics Concern   Not on file  Social History Narrative   Not on file   Social Drivers of Health   Financial Resource Strain: Patient Declined (01/21/2023)   Overall Financial Resource Strain (CARDIA)    Difficulty of Paying Living Expenses: Patient declined  Food Insecurity: Patient Declined (01/21/2023)   Hunger Vital Sign    Worried About Running Out of Food in the Last Year: Patient declined    Ran Out of Food in the Last Year: Patient declined  Transportation Needs: Patient Declined (01/21/2023)   PRAPARE - Administrator, Civil Service (Medical): Patient declined    Lack of Transportation (Non-Medical): Patient declined  Physical Activity: Unknown (01/21/2023)   Exercise Vital Sign    Days of Exercise per Week: Patient declined    Minutes of Exercise per Session: Not on file  Stress: No Stress Concern Present (01/21/2023)   [employer] of Occupational Health - Occupational Stress Questionnaire    Feeling of Stress : Not at all  Social Connections: Unknown (01/21/2023)   Social Connection and  Isolation Panel [NHANES]    Frequency of Communication with Friends and Family: Patient declined    Frequency of Social Gatherings with Friends and Family: Patient declined    Attends Religious Services: Patient declined    Active Member of Clubs or Organizations: Patient declined    Attends Banker Meetings: Not on file    Marital Status: Married  Intimate Partner Violence: Not on file    Laboratory Investigations Lab Results  Component Value Date   TSH 1.24 07/25/2023   TSH 0.96 01/21/2023   TSH 1.29 03/28/2020   FREET4 1.1 07/25/2023   FREET4 0.82 10/11/2017   FREET4 0.81 06/25/2014     No results found for: "TSI"   No components found for: "TRAB"   Lab Results  Component Value Date   CHOL 188 01/21/2023    Lab Results  Component Value Date   HDL 47.40 01/21/2023   Lab Results  Component Value Date   LDLCALC 118 (H) 01/21/2023   Lab Results  Component Value Date   TRIG 115.0 01/21/2023   Lab Results  Component Value Date   CHOLHDL 4 01/21/2023   Lab Results  Component Value Date   CREATININE 0.72 01/21/2023   Lab Results  Component Value Date   GFR 99.30 01/21/2023      Component Value Date/Time   NA 136 01/21/2023 1454   NA 138 06/25/2018 1247   K 3.9 01/21/2023 1454   CL 103 01/21/2023 1454   CO2 27 01/21/2023 1454   GLUCOSE 120 (H) 01/21/2023 1454   BUN 11 01/21/2023 1454   BUN 10 06/25/2018 1247   CREATININE 0.72 01/21/2023 1454   CALCIUM 9.4 01/21/2023 1454   PROT 7.1 01/21/2023 1454   PROT 6.8 06/25/2018 1247   ALBUMIN 4.1 01/21/2023 1454   ALBUMIN 4.4 06/25/2018 1247   AST 13 01/21/2023 1454   ALT 18 01/21/2023 1454   ALKPHOS 55 01/21/2023 1454   BILITOT 0.3 01/21/2023 1454   BILITOT 0.4 06/25/2018 1247   GFRNONAA >60 07/17/2018 1429   GFRAA >60 07/17/2018 1429      Latest Ref Rng & Units 01/21/2023    2:54 PM 08/12/2020   12:18 PM 07/17/2018    2:29 PM  BMP  Glucose 70 - 99 mg/dL 865  91  88   BUN 6 - 23 mg/dL 11  11  17    Creatinine 0.40 - 1.20 mg/dL 7.84  6.96  2.95   Sodium 135 - 145 mEq/L 136  137  136   Potassium 3.5 - 5.1 mEq/L 3.9  3.9  4.0   Chloride 96 - 112 mEq/L 103  104  104   CO2 19 - 32 mEq/L 27  26  23    Calcium 8.4 - 10.5 mg/dL 9.4  9.2  9.5        Component Value Date/Time   WBC 6.1 01/21/2023 1454   RBC 4.48 01/21/2023 1454   HGB 14.0 01/21/2023 1454   HCT 43.0 01/21/2023 1454   PLT 204.0 01/21/2023 1454   MCV 95.9 01/21/2023 1454   MCH 32.1 07/17/2018 1429   MCHC 32.5 01/21/2023 1454   RDW 12.8 01/21/2023 1454   LYMPHSABS 1.6 01/21/2023 1454   MONOABS 0.7 01/21/2023 1454   EOSABS 0.2 01/21/2023 1454   BASOSABS 0.0 01/21/2023 1454      Parts of this note may have been dictated using voice recognition software.  There may be variances in spelling and vocabulary which are unintentional. Not all errors  are proofread. Please notify the Thereasa Parkin if any discrepancies are noted or if the meaning of any statement is not clear.

## 2023-08-02 ENCOUNTER — Ambulatory Visit (HOSPITAL_BASED_OUTPATIENT_CLINIC_OR_DEPARTMENT_OTHER)
Admission: RE | Admit: 2023-08-02 | Discharge: 2023-08-02 | Disposition: A | Discharge status: Home / Self Care | Source: Ambulatory Visit | Attending: "Endocrinology | Admitting: "Endocrinology

## 2023-08-02 DIAGNOSIS — E042 Nontoxic multinodular goiter: Secondary | ICD-10-CM | POA: Insufficient documentation

## 2023-08-02 DIAGNOSIS — E041 Nontoxic single thyroid nodule: Secondary | ICD-10-CM | POA: Diagnosis not present

## 2023-08-06 ENCOUNTER — Other Ambulatory Visit (HOSPITAL_COMMUNITY): Payer: Self-pay

## 2023-08-12 ENCOUNTER — Encounter: Payer: Self-pay | Admitting: "Endocrinology

## 2023-08-12 ENCOUNTER — Other Ambulatory Visit (HOSPITAL_COMMUNITY): Payer: Self-pay

## 2023-08-12 ENCOUNTER — Telehealth: Payer: Self-pay

## 2023-08-12 ENCOUNTER — Telehealth (INDEPENDENT_AMBULATORY_CARE_PROVIDER_SITE_OTHER): Admitting: "Endocrinology

## 2023-08-12 DIAGNOSIS — R7309 Other abnormal glucose: Secondary | ICD-10-CM | POA: Diagnosis not present

## 2023-08-12 DIAGNOSIS — E041 Nontoxic single thyroid nodule: Secondary | ICD-10-CM

## 2023-08-12 DIAGNOSIS — E7800 Pure hypercholesterolemia, unspecified: Secondary | ICD-10-CM | POA: Diagnosis not present

## 2023-08-12 MED ORDER — OZEMPIC (0.25 OR 0.5 MG/DOSE) 2 MG/3ML ~~LOC~~ SOPN: 0.2500 mg | PEN_INJECTOR | SUBCUTANEOUS | 1 refills | Status: DC

## 2023-08-12 NOTE — Progress Notes (Signed)
 The patient reports they are currently: [name]. I spent about 8 minutes on the video with the patient on the date of service. I spent an additional 10 minutes on pre- and post-visit activities on the date of service.   The patient was physically located in [place] or a state in which I am permitted to provide care. The patient and/or parent/guardian understood that s/he may incur co-pays and cost sharing, and agreed to the telemedicine visit. The visit was reasonable and appropriate under the circumstances given the patient's presentation at the time.  The patient and/or parent/guardian has been advised of the potential risks and limitations of this mode of treatment (including, but not limited to, the absence of in-person examination) and has agreed to be treated using telemedicine. The patient's/patient's family's questions regarding telemedicine have been answered.   The patient and/or parent/guardian has also been advised to contact their provider's office for worsening conditions, and seek emergency medical treatment and/or call 911 if the patient deems either necessary.     Outpatient Endocrinology Note Laura [place], MD  08/12/23   Laura [name] 02-05-1976 [id number]  Referring Provider: Corwin Levins, MD Primary Care Provider: Corwin Levins, MD Subjective  No chief complaint on file.   Assessment & Plan  Diagnoses and all orders for this visit:  Uninodular goiter  Elevated hemoglobin A1c -     Hemoglobin A1c -     Lipid panel -     Microalbumin / creatinine urine ratio -     Comprehensive metabolic panel  Morbid obesity (HCC)  Pure hypercholesterolemia  Other orders -     Semaglutide,0.25 or 0.5MG /DOS, (OZEMPIC, 0.25 OR 0.5 MG/DOSE,) 2 MG/3ML SOPN; Inject 0.25 mg into the skin once a week.   Laura [name] has never been on any thyroid medication. Patient is currently biochemically euthyroid.  Educated on thyroid axis.  No history of FNA for  multinodular  Last thyroid U/S in 2019: has multiple nodules, biggest was Left; Mid 1.8 cm x 1.5 x 0.8 cm 07/2023 Thyroid US images and report reviewed, reported thyromegaly with previously visualized solid cystic nodule in the left mid thyroid (labeled 1, 1.4 cm, previously 1.8 cm) appears benign (TI-RADS category 2) and does not warrant additional follow-up  Morbid obesity with elevated last A1C at 7.6%v and elevated LDL Ordered repeat lab  Previously benefited from ozempic, but now not covered by insurance On metformin XR 500 mg every day-causes diarrhea  Unable to tolerate lowest dose of slow release metformin Ordered ozempic 0.25 mg/week  No history of MEN syndrome/medullary thyroid cancer/pancreatitis or pancreatic cancer in self or family  Gritty, watery eyes No known graves disease Previously, recommend ruling out sarcoidosis by PCP and seeing an ophthalmologist   I have reviewed current medications, nurse's notes, allergies, vital signs, past medical and surgical history, family medical history, and social history for this encounter. Counseled patient on symptoms, examination findings, lab findings, imaging results, treatment decisions and monitoring and prognosis. The patient understood the recommendations and agrees with the treatment plan. All questions regarding treatment plan were fully answered.   Return in about 4 weeks (around 09/09/2023) for visit and 8 am fasting labs now.   Laura Hewlett Ne[place], MD  08/12/23   I have reviewed current medications, nurse's notes, allergies, vital signs, past medical and surgical history, family medical history, and social history for this encounter. Counseled patient on symptoms, examination findings, lab findings, imaging results, treatment decisions and monitoring and prognosis. The patient understood  the recommendations and agrees with the treatment plan. All questions regarding treatment plan were fully answered.   History of Present  Illness Laura [name] is a 48 y.o. year old female who presents to our clinic with multinodular goiter diagnosed around 2013.    Symptoms suggestive of HYPOTHYROIDISM:  fatigue No weight gain No cold intolerance  No constipation  No  Symptoms suggestive of HYPERTHYROIDISM:  weight loss  No heat intolerance No hyperdefecation  No palpitations  No  Compressive symptoms:  dysphagia  No dysphonia  No positional dyspnea (especially with simultaneous arms elevation)  No  Smokes  No On biotin  No Personal history of head/[name] surgery/irradiation  No  Never been on a thyroid medication.  07/2023 Thyroid US images and report reviewed, reported thyromegaly with previously visualized solid cystic nodule in the left mid thyroid (labeled 1, 1.4 cm, previously 1.8 cm) appears benign (TI-RADS category 2) and does not warrant additional follow-up  Unable to tolerate metformin XR 500 mg every day-causes diarrhea     Physical Exam  There were no vitals taken for this visit. Constitutional: well developed, well nourished Head: normocephalic, atraumatic, no exophthalmos Eyes: sclera anicteric, no redness [name]: + thyromegaly, no thyroid tenderness; + nodules  Lungs: normal respiratory effort Neurology: alert and oriented, no fine hand tremor Skin: dry, no appreciable rashes Musculoskeletal: no appreciable defects Psychiatric: normal mood and affect  Allergies No Known Allergies  Current Medications Patient's Medications  New Prescriptions   No medications on file  Previous Medications   ALBUTEROL SULFATE (PROAIR RESPICLICK) 108 (90 BASE) MCG/ACT AEPB    Take 1 puff by mouth daily as needed (use only as needed).   CALCIUM-VITAMIN D (OSCAL WITH D) 500-5 MG-MCG TABLET    Take 1 tablet by mouth.   FERROUS SULFATE (FEOSOL PO)    Take 1 tablet by mouth daily.   LOSARTAN (COZAAR) 100 MG TABLET    Take 1 tablet (100 mg total) by mouth daily.   METFORMIN (GLUCOPHAGE-XR) 500 MG 24 HR  TABLET    Take 1 tablet (500 mg total) by mouth daily with breakfast.   METOPROLOL SUCCINATE (TOPROL-XL) 100 MG 24 HR TABLET    Take 1 tablet (100 mg total) by mouth daily. Take with or immediately following a meal.   ONDANSETRON (ZOFRAN) 4 MG TABLET    Take 1 tablet (4 mg total) by mouth as directed. Take one Zofran 4 mg tablet 30-60 minutes before each colonoscopy prep dose  Modified Medications   Modified Medication Previous Medication   SEMAGLUTIDE,0.25 OR 0.5MG /DOS, (OZEMPIC, 0.25 OR 0.5 MG/DOSE,) 2 MG/3ML SOPN Semaglutide,0.25 or 0.5MG /DOS, (OZEMPIC, 0.25 OR 0.5 MG/DOSE,) 2 MG/3ML SOPN      Inject 0.25 mg into the skin once a week.    Take 0.50 mg subcutaneous once weekly  Discontinued Medications   No medications on file    Past Medical History Past Medical History:  Diagnosis Date   ALLERGIC RHINITIS 06/01/2008   Anemia    Anxiety 08/15/2010   ASTHMA 06/01/2008   Asthma    BURSITIS, LEFT HIP 04/28/2010   Diabetes mellitus without complication (HCC)    GERD (gastroesophageal reflux disease)    GOITER, MULTINODULAR 06/28/2008   Hirsutism 06/02/2009   HYPERTENSION 06/01/2008   Morbid (severe) obesity due to excess calories (HCC) 04/18/2023   bmi 42.77   Palpitations 07/25/2010   PERIPHERAL EDEMA 04/28/2010   Pharyngitis, acute 10/09/2010   TONSILLITIS, RECURRENT 05/05/2009    Past Surgical History Past Surgical History:  Procedure Laterality Date   bilateral turbiante reductions     CESAREAN SECTION  2007   DILATATION & CURETTAGE/HYSTEROSCOPY WITH MYOSURE N/A 07/22/2018   Procedure: DILATATION & CURETTAGE/HYSTEROSCOPY WITH MYOSURE XL;  Surgeon: Maxie Better, MD;  Location: [facility];  Service: Gynecology;  Laterality: N/A;   s/p sinus surgury  2006    Family History family history includes Hyperlipidemia in her maternal grandfather, maternal grandmother, and mother; Hypertension in her maternal grandfather, maternal grandmother, and mother; Lung  cancer in her maternal grandfather.  Social History Social History   Socioeconomic History   Marital status: Married    Spouse name: Margorie Renner   Number of children: 1   Years of education: Not on file   Highest education level: Bachelor's degree (e.g., BA, AB, BS)  Occupational History   Occupation: Teacher, adult education: UNEMPLOYED  Tobacco Use   Smoking status: Never   Smokeless tobacco: Never  Vaping Use   Vaping status: Never Used  Substance and Sexual Activity   Alcohol use: No   Drug use: No   Sexual activity: Not on file  Other Topics Concern   Not on file  Social History Narrative   Not on file   Social Drivers of Health   Financial Resource Strain: Patient Declined (01/21/2023)   Overall Financial Resource Strain (CARDIA)    Difficulty of Paying Living Expenses: Patient declined  Food Insecurity: Patient Declined (01/21/2023)   Hunger Vital Sign    Worried About Running Out of Food in the Last Year: Patient declined    Ran Out of Food in the Last Year: Patient declined  Transportation Needs: Patient Declined (01/21/2023)   PRAPARE - Administrator, Civil Service (Medical): Patient declined    Lack of Transportation (Non-Medical): Patient declined  Physical Activity: Unknown (01/21/2023)   Exercise Vital Sign    Days of Exercise per Week: Patient declined    Minutes of Exercise per Session: Not on file  Stress: No Stress Concern Present (01/21/2023)   [employer] of Occupational Health - Occupational Stress Questionnaire    Feeling of Stress : Not at all  Social Connections: Unknown (01/21/2023)   Social Connection and Isolation Panel [NHANES]    Frequency of Communication with Friends and Family: Patient declined    Frequency of Social Gatherings with Friends and Family: Patient declined    Attends Religious Services: Patient declined    Active Member of Clubs or Organizations: Patient declined    Attends Banker Meetings: Not on  file    Marital Status: Married  Intimate Partner Violence: Not on file    Laboratory Investigations Lab Results  Component Value Date   TSH 1.24 07/25/2023   TSH 0.96 01/21/2023   TSH 1.29 03/28/2020   FREET4 1.1 07/25/2023   FREET4 0.82 10/11/2017   FREET4 0.81 06/25/2014     No results found for: "TSI"   No components found for: "TRAB"   Lab Results  Component Value Date   CHOL 188 01/21/2023   Lab Results  Component Value Date   HDL 47.40 01/21/2023   Lab Results  Component Value Date   LDLCALC 118 (H) 01/21/2023   Lab Results  Component Value Date   TRIG 115.0 01/21/2023   Lab Results  Component Value Date   CHOLHDL 4 01/21/2023   Lab Results  Component Value Date   CREATININE 0.72 01/21/2023   Lab Results  Component Value Date   GFR 99.30 01/21/2023  Component Value Date/Time   NA 136 01/21/2023 1454   NA 138 06/25/2018 1247   K 3.9 01/21/2023 1454   CL 103 01/21/2023 1454   CO2 27 01/21/2023 1454   GLUCOSE 120 (H) 01/21/2023 1454   BUN 11 01/21/2023 1454   BUN 10 06/25/2018 1247   CREATININE 0.72 01/21/2023 1454   CALCIUM 9.4 01/21/2023 1454   PROT 7.1 01/21/2023 1454   PROT 6.8 06/25/2018 1247   ALBUMIN 4.1 01/21/2023 1454   ALBUMIN 4.4 06/25/2018 1247   AST 13 01/21/2023 1454   ALT 18 01/21/2023 1454   ALKPHOS 55 01/21/2023 1454   BILITOT 0.3 01/21/2023 1454   BILITOT 0.4 06/25/2018 1247   GFRNONAA >60 07/17/2018 1429   GFRAA >60 07/17/2018 1429      Latest Ref Rng & Units 01/21/2023    2:54 PM 08/12/2020   12:18 PM 07/17/2018    2:29 PM  BMP  Glucose 70 - 99 mg/dL 161  91  88   BUN 6 - 23 mg/dL 11  11  17    Creatinine 0.40 - 1.20 mg/dL 0.96  0.45  4.09   Sodium 135 - 145 mEq/L 136  137  136   Potassium 3.5 - 5.1 mEq/L 3.9  3.9  4.0   Chloride 96 - 112 mEq/L 103  104  104   CO2 19 - 32 mEq/L 27  26  23    Calcium 8.4 - 10.5 mg/dL 9.4  9.2  9.5        Component Value Date/Time   WBC 6.1 01/21/2023 1454   RBC 4.48  01/21/2023 1454   HGB 14.0 01/21/2023 1454   HCT 43.0 01/21/2023 1454   PLT 204.0 01/21/2023 1454   MCV 95.9 01/21/2023 1454   MCH 32.1 07/17/2018 1429   MCHC 32.5 01/21/2023 1454   RDW 12.8 01/21/2023 1454   LYMPHSABS 1.6 01/21/2023 1454   MONOABS 0.7 01/21/2023 1454   EOSABS 0.2 01/21/2023 1454   BASOSABS 0.0 01/21/2023 1454      Parts of this note may have been dictated using voice recognition software. There may be variances in spelling and vocabulary which are unintentional. Not all errors are proofread. Please notify the Thereasa Parkin if any discrepancies are noted or if the meaning of any statement is not clear.

## 2023-08-12 NOTE — Patient Instructions (Signed)

## 2023-08-12 NOTE — Telephone Encounter (Signed)
 PA for ozempic, cannot tolerate metformin

## 2023-08-14 NOTE — Telephone Encounter (Signed)
 Can appeal be completed

## 2023-08-20 ENCOUNTER — Encounter: Payer: Self-pay | Admitting: "Endocrinology

## 2023-08-22 ENCOUNTER — Telehealth: Payer: Self-pay

## 2023-08-22 NOTE — Telephone Encounter (Signed)
 Pharmacy Patient Advocate Encounter   Received notification from Pt Calls Messages that prior authorization for Ozempic is required/requested.   Insurance verification completed.   The patient is insured through [facility] .   Per test claim: PA required; PA submitted to above mentioned insurance via CoverMyMeds Key/confirmation #/EOC [id number] Status is pending

## 2023-08-22 NOTE — Telephone Encounter (Signed)
 Pharmacy Patient Advocate Encounter  Received notification from [facility] that Prior Authorization for Ozempic has been APPROVED through 08/21/2024   PA #/Case ID/Reference #: [id number]

## 2023-08-23 NOTE — Telephone Encounter (Signed)
 This has been approved in separate encounter, pt and clinic aware of approval

## 2023-08-26 DIAGNOSIS — Z1231 Encounter for screening mammogram for malignant neoplasm of breast: Secondary | ICD-10-CM | POA: Diagnosis not present

## 2023-09-19 DIAGNOSIS — F4323 Adjustment disorder with mixed anxiety and depressed mood: Secondary | ICD-10-CM | POA: Diagnosis not present

## 2023-09-28 DIAGNOSIS — F4323 Adjustment disorder with mixed anxiety and depressed mood: Secondary | ICD-10-CM | POA: Diagnosis not present

## 2023-10-12 DIAGNOSIS — F4323 Adjustment disorder with mixed anxiety and depressed mood: Secondary | ICD-10-CM | POA: Diagnosis not present

## 2023-10-26 DIAGNOSIS — F4323 Adjustment disorder with mixed anxiety and depressed mood: Secondary | ICD-10-CM | POA: Diagnosis not present

## 2023-10-29 ENCOUNTER — Encounter: Payer: Self-pay | Admitting: "Endocrinology

## 2023-10-29 NOTE — Telephone Encounter (Signed)
 Lvm for pt to call back.

## 2023-10-29 NOTE — Telephone Encounter (Signed)
 Called pt to inform pt that she need at appt to increase ozmepic. Transferred pt to front desk to schedule appt.

## 2023-11-01 ENCOUNTER — Telehealth (INDEPENDENT_AMBULATORY_CARE_PROVIDER_SITE_OTHER): Admitting: "Endocrinology

## 2023-11-01 ENCOUNTER — Encounter: Payer: Self-pay | Admitting: "Endocrinology

## 2023-11-01 VITALS — Ht 60.0 in | Wt 208.0 lb

## 2023-11-01 DIAGNOSIS — R7309 Other abnormal glucose: Secondary | ICD-10-CM

## 2023-11-01 DIAGNOSIS — E041 Nontoxic single thyroid nodule: Secondary | ICD-10-CM | POA: Diagnosis not present

## 2023-11-01 MED ORDER — SEMAGLUTIDE (1 MG/DOSE) 4 MG/3ML ~~LOC~~ SOPN: 1.0000 mg | PEN_INJECTOR | SUBCUTANEOUS | 0 refills | Status: DC

## 2023-11-01 NOTE — Progress Notes (Addendum)
 The patient reports they are currently: Laura [name]. I spent about 7-8 minutes on the video with the patient on the date of service. I spent an additional 5 minutes on pre- and post-visit activities on the date of service.   The patient was physically located in [place]  or a state in which I am permitted to provide care. The patient and/or parent/guardian understood that s/he may incur co-pays and cost sharing, and agreed to the telemedicine visit. The visit was reasonable and appropriate under the circumstances given the patient's presentation at the time.  The patient and/or parent/guardian has been advised of the potential risks and limitations of this mode of treatment (including, but not limited to, the absence of in-person examination) and has agreed to be treated using telemedicine. The patient's/patient's family's questions regarding telemedicine have been answered.   The patient and/or parent/guardian has also been advised to contact their provider's office for worsening conditions, and seek emergency medical treatment and/or call 911 if the patient deems either necessary.     Outpatient Endocrinology Note Jorge Newcomer, MD  11/01/23   Laura [name] May 27, 1976 [id number]  Referring Provider: Roslyn Coombe, MD Primary Care Provider: Roslyn Coombe, MD Subjective  No chief complaint on file.   Assessment & Plan  Diagnoses and all orders for this visit:  Uninodular goiter  Elevated hemoglobin A1c  Morbid obesity (HCC)  Other orders -     Semaglutide , 1 MG/DOSE, 4 MG/3ML SOPN; Inject 1 mg as directed once a week.    Anasophia C Cournoyer has never been on any thyroid  medication. Patient is currently biochemically euthyroid.  Educated on thyroid  axis.  No history of FNA for multinodular  Last thyroid  U/S in 2019: has multiple nodules, biggest was Left; Mid 1.8 cm x 1.5 x 0.8 cm 07/2023 Thyroid  US  images and report reviewed, reported thyromegaly with previously  visualized solid cystic nodule in the left mid thyroid  (labeled 1, 1.4 cm, previously 1.8 cm) appears benign (TI-RADS category 2) and does not warrant additional follow-up Asymptomatic currently  Morbid obesity with elevated last A1C at 7.6% and elevated LDL Ordered repeat lab  Previously benefited from ozempic , but now not covered by insurance On metformin  XR 500 mg every day-causes diarrhea  Unable to tolerate lowest dose of slow release metformin  On ozempic  0.5 mg/week (starting weight 228 lbs, now at 208 lbs), increased dose to 1 mg/week  No history of MEN syndrome/medullary thyroid  cancer/pancreatitis or pancreatic cancer in self or family  Gritty, watery eyes No known graves disease Previously, recommend ruling out sarcoidosis by PCP and seeing an ophthalmologist   I have reviewed current medications, nurse's notes, allergies, vital signs, past medical and surgical history, family medical history, and social history for this encounter. Counseled patient on symptoms, examination findings, lab findings, imaging results, treatment decisions and monitoring and prognosis. The patient understood the recommendations and agrees with the treatment plan. All questions regarding treatment plan were fully answered.  Return in about 4 weeks (around 11/29/2023).   Jorge Newcomer, MD  11/01/23   I have reviewed current medications, nurse's notes, allergies, vital signs, past medical and surgical history, family medical history, and social history for this encounter. Counseled patient on symptoms, examination findings, lab findings, imaging results, treatment decisions and monitoring and prognosis. The patient understood the recommendations and agrees with the treatment plan. All questions regarding treatment plan were fully answered.   History of Present Illness Laura [name] is a 48 y.o. year old female who presents  to our clinic to follow up with multinodular goiter diagnosed around 2013.     Symptoms suggestive of HYPOTHYROIDISM:  fatigue No weight gain No cold intolerance  No constipation  No  Symptoms suggestive of HYPERTHYROIDISM:  weight loss  No heat intolerance No hyperdefecation  No palpitations  No  Compressive symptoms:  dysphagia  No dysphonia  No positional dyspnea (especially with simultaneous arms elevation)  No  Smokes  No On biotin  No Personal history of head/neck surgery/irradiation  No  Never been on a thyroid  medication.  07/2023 Thyroid  US  images and report reviewed, reported thyromegaly with previously visualized solid cystic nodule in the left mid thyroid  (labeled 1, 1.4 cm, previously 1.8 cm) appears benign (TI-RADS category 2) and does not warrant additional follow-up  Unable to tolerate metformin  XR 500 mg every day-causes diarrhea   Physical Exam  Ht 5' (1.524 m)   Wt 208 lb (94.3 kg)   BMI 40.62 kg/m  Constitutional: well developed, well nourished Head: normocephalic, atraumatic, no exophthalmos Eyes: sclera anicteric, no redness Neck: + thyromegaly, no thyroid  tenderness; + nodules  Lungs: normal respiratory effort Neurology: alert and oriented, no fine hand tremor Skin: dry, no appreciable rashes Musculoskeletal: no appreciable defects Psychiatric: normal mood and affect  Allergies No Known Allergies  Current Medications Patient's Medications  New Prescriptions   SEMAGLUTIDE , 1 MG/DOSE, 4 MG/3ML SOPN    Inject 1 mg as directed once a week.  Previous Medications   ALBUTEROL  SULFATE (PROAIR  RESPICLICK) 108 (90 BASE) MCG/ACT AEPB    Take 1 puff by mouth daily as needed (use only as needed).   CALCIUM-VITAMIN D  (OSCAL WITH D) 500-5 MG-MCG TABLET    Take 1 tablet by mouth.   FERROUS SULFATE (FEOSOL PO)    Take 1 tablet by mouth daily.   LOSARTAN  (COZAAR ) 100 MG TABLET    Take 1 tablet (100 mg total) by mouth daily.   METFORMIN  (GLUCOPHAGE -XR) 500 MG 24 HR TABLET    Take 1 tablet (500 mg total) by mouth daily with  breakfast.   METOPROLOL  SUCCINATE (TOPROL -XL) 100 MG 24 HR TABLET    Take 1 tablet (100 mg total) by mouth daily. Take with or immediately following a meal.   ONDANSETRON  (ZOFRAN ) 4 MG TABLET    Take 1 tablet (4 mg total) by mouth as directed. Take one Zofran  4 mg tablet 30-60 minutes before each colonoscopy prep dose  Modified Medications   No medications on file  Discontinued Medications   SEMAGLUTIDE ,0.25 OR 0.5MG /DOS, (OZEMPIC , 0.25 OR 0.5 MG/DOSE,) 2 MG/3ML SOPN    Inject 0.25 mg into the skin once a week.    Past Medical History Past Medical History:  Diagnosis Date   ALLERGIC RHINITIS 06/01/2008   Anemia    Anxiety 08/15/2010   ASTHMA 06/01/2008   Asthma    BURSITIS, LEFT HIP 04/28/2010   Diabetes mellitus without complication (HCC)    GERD (gastroesophageal reflux disease)    GOITER, MULTINODULAR 06/28/2008   Hirsutism 06/02/2009   HYPERTENSION 06/01/2008   Morbid (severe) obesity due to excess calories (HCC) 04/18/2023   bmi 42.77   Palpitations 07/25/2010   PERIPHERAL EDEMA 04/28/2010   Pharyngitis, acute 10/09/2010   TONSILLITIS, RECURRENT 05/05/2009    Past Surgical History Past Surgical History:  Procedure Laterality Date   bilateral turbiante reductions     CESAREAN SECTION  2007   DILATATION & CURETTAGE/HYSTEROSCOPY WITH MYOSURE N/A 07/22/2018   Procedure: DILATATION & CURETTAGE/HYSTEROSCOPY WITH MYOSURE XL;  Surgeon: Ivery Marking,  MD;  Location: [facility];  Service: Gynecology;  Laterality: N/A;   s/p sinus surgury  2006    Family History family history includes Hyperlipidemia in her maternal grandfather, maternal grandmother, and mother; Hypertension in her maternal grandfather, maternal grandmother, and mother; Lung cancer in her maternal grandfather.  Social History Social History   Socioeconomic History   Marital status: Married    Spouse name: Malloree Raboin   Number of children: 1   Years of education: Not on file    Highest education level: Bachelor's degree (e.g., BA, AB, BS)  Occupational History   Occupation: Teacher, adult education: UNEMPLOYED  Tobacco Use   Smoking status: Never   Smokeless tobacco: Never  Vaping Use   Vaping status: Never Used  Substance and Sexual Activity   Alcohol use: No   Drug use: No   Sexual activity: Not on file  Other Topics Concern   Not on file  Social History Narrative   Not on file   Social Drivers of Health   Financial Resource Strain: Patient Declined (01/21/2023)   Overall Financial Resource Strain (CARDIA)    Difficulty of Paying Living Expenses: Patient declined  Food Insecurity: Patient Declined (01/21/2023)   Hunger Vital Sign    Worried About Running Out of Food in the Last Year: Patient declined    Ran Out of Food in the Last Year: Patient declined  Transportation Needs: Patient Declined (01/21/2023)   PRAPARE - Administrator, Civil Service (Medical): Patient declined    Lack of Transportation (Non-Medical): Patient declined  Physical Activity: Unknown (01/21/2023)   Exercise Vital Sign    Days of Exercise per Week: Patient declined    Minutes of Exercise per Session: Not on file  Stress: No Stress Concern Present (01/21/2023)   [employer] of Occupational Health - Occupational Stress Questionnaire    Feeling of Stress : Not at all  Social Connections: Unknown (01/21/2023)   Social Connection and Isolation Panel [NHANES]    Frequency of Communication with Friends and Family: Patient declined    Frequency of Social Gatherings with Friends and Family: Patient declined    Attends Religious Services: Patient declined    Database administrator or Organizations: Patient declined    Attends Banker Meetings: Not on file    Marital Status: Married  Intimate Partner Violence: Not on file    Laboratory Investigations Lab Results  Component Value Date   TSH 1.24 07/25/2023   TSH 0.96 01/21/2023   TSH 1.29 03/28/2020   FREET4  1.1 07/25/2023   FREET4 0.82 10/11/2017   FREET4 0.81 06/25/2014     No results found for: "TSI"   No components found for: "TRAB"   Lab Results  Component Value Date   CHOL 188 01/21/2023   Lab Results  Component Value Date   HDL 47.40 01/21/2023   Lab Results  Component Value Date   LDLCALC 118 (H) 01/21/2023   Lab Results  Component Value Date   TRIG 115.0 01/21/2023   Lab Results  Component Value Date   CHOLHDL 4 01/21/2023   Lab Results  Component Value Date   CREATININE 0.72 01/21/2023   Lab Results  Component Value Date   GFR 99.30 01/21/2023      Component Value Date/Time   NA 136 01/21/2023 1454   NA 138 06/25/2018 1247   K 3.9 01/21/2023 1454   CL 103 01/21/2023 1454   CO2 27 01/21/2023 1454  GLUCOSE 120 (H) 01/21/2023 1454   BUN 11 01/21/2023 1454   BUN 10 06/25/2018 1247   CREATININE 0.72 01/21/2023 1454   CALCIUM 9.4 01/21/2023 1454   PROT 7.1 01/21/2023 1454   PROT 6.8 06/25/2018 1247   ALBUMIN 4.1 01/21/2023 1454   ALBUMIN 4.4 06/25/2018 1247   AST 13 01/21/2023 1454   ALT 18 01/21/2023 1454   ALKPHOS 55 01/21/2023 1454   BILITOT 0.3 01/21/2023 1454   BILITOT 0.4 06/25/2018 1247   GFRNONAA >60 07/17/2018 1429   GFRAA >60 07/17/2018 1429      Latest Ref Rng & Units 01/21/2023    2:54 PM 08/12/2020   12:18 PM 07/17/2018    2:29 PM  BMP  Glucose 70 - 99 mg/dL 829  91  88   BUN 6 - 23 mg/dL 11  11  17    Creatinine 0.40 - 1.20 mg/dL 5.62  1.30  8.65   Sodium 135 - 145 mEq/L 136  137  136   Potassium 3.5 - 5.1 mEq/L 3.9  3.9  4.0   Chloride 96 - 112 mEq/L 103  104  104   CO2 19 - 32 mEq/L 27  26  23    Calcium 8.4 - 10.5 mg/dL 9.4  9.2  9.5        Component Value Date/Time   WBC 6.1 01/21/2023 1454   RBC 4.48 01/21/2023 1454   HGB 14.0 01/21/2023 1454   HCT 43.0 01/21/2023 1454   PLT 204.0 01/21/2023 1454   MCV 95.9 01/21/2023 1454   MCH 32.1 07/17/2018 1429   MCHC 32.5 01/21/2023 1454   RDW 12.8 01/21/2023 1454   LYMPHSABS 1.6  01/21/2023 1454   MONOABS 0.7 01/21/2023 1454   EOSABS 0.2 01/21/2023 1454   BASOSABS 0.0 01/21/2023 1454      Parts of this note may have been dictated using voice recognition software. There may be variances in spelling and vocabulary which are unintentional. Not all errors are proofread. Please notify the [facility] if any discrepancies are noted or if the meaning of any statement is not clear.

## 2023-11-04 ENCOUNTER — Other Ambulatory Visit (HOSPITAL_COMMUNITY): Payer: Self-pay

## 2023-11-04 ENCOUNTER — Telehealth: Payer: Self-pay | Admitting: Pharmacy Technician

## 2023-11-04 NOTE — Telephone Encounter (Signed)
 Pharmacy Patient Advocate Encounter   Received notification from CoverMyMeds that prior authorization for Ozempic  (1 MG/DOSE) 4MG /3ML pen-injectors is required/requested.   Insurance verification completed.   The patient is insured through [employer] .   Per test claim: PA required and submitted KEY/EOC/Request #: [id number] from 11/04/2023 to 11/03/2024. Ran test claim, Copay is $24.99. This test claim was processed through [facility]- copay amounts may vary at other pharmacies due to pharmacy/plan contracts, or as the patient moves through the different stages of their insurance plan.

## 2023-11-09 DIAGNOSIS — F4323 Adjustment disorder with mixed anxiety and depressed mood: Secondary | ICD-10-CM | POA: Diagnosis not present

## 2023-11-18 ENCOUNTER — Telehealth: Payer: Self-pay | Admitting: "Endocrinology

## 2023-11-18 NOTE — Telephone Encounter (Signed)
 Patient is needing to be seen earlier than provider's schedule allows,and is concerned that not being seen according to the provider's directions will effect her medication.

## 2023-11-20 ENCOUNTER — Ambulatory Visit: Admitting: "Endocrinology

## 2023-11-20 ENCOUNTER — Encounter: Payer: Self-pay | Admitting: "Endocrinology

## 2023-11-20 VITALS — BP 120/80 | HR 88 | Ht 60.0 in | Wt 203.0 lb

## 2023-11-20 DIAGNOSIS — Z6839 Body mass index (BMI) 39.0-39.9, adult: Secondary | ICD-10-CM

## 2023-11-20 DIAGNOSIS — E042 Nontoxic multinodular goiter: Secondary | ICD-10-CM

## 2023-11-20 DIAGNOSIS — E66812 Obesity, class 2: Secondary | ICD-10-CM

## 2023-11-20 MED ORDER — SEMAGLUTIDE (2 MG/DOSE) 8 MG/3ML ~~LOC~~ SOPN: 2.0000 mg | PEN_INJECTOR | SUBCUTANEOUS | 1 refills | Status: DC

## 2023-11-20 NOTE — Patient Instructions (Signed)

## 2023-11-20 NOTE — Progress Notes (Signed)
 Outpatient Endocrinology Note Obadiah Birmingham, MD  11/20/23   JAYDEE INGMAN 09/21/1975 [id number]  Referring Provider: Norleen Lynwood ORN, MD Primary Care Provider: Norleen Lynwood ORN, MD Subjective  No chief complaint on file.  Assessment & Plan  Diagnoses and all orders for this visit:  Multinodular goiter  Class 2 severe obesity due to excess calories with serious comorbidity and body mass index (BMI) of 39.0 to 39.9 in adult [facility])  Other orders -     Semaglutide , 2 MG/DOSE, 8 MG/3ML SOPN; Inject 2 mg as directed once a week.   Graeme JAYSON Bras has never been on any thyroid  medication. Patient is currently biochemically euthyroid.  Educated on thyroid  axis.  No history of FNA for multinodular  Last thyroid  U/S in 2019: has multiple nodules, biggest was Left; Mid 1.8 cm x 1.5 x 0.8 cm 07/2023 Thyroid  US  images and report reviewed, reported thyromegaly with previously visualized solid cystic nodule in the left mid thyroid  (labeled 1, 1.4 cm, previously 1.8 cm) appears benign (TI-RADS category 2) and does not warrant additional follow-up Asymptomatic currently  Morbid obesity with elevated last A1C at 7.6% and elevated LDL Previously benefited from ozempic , but now not covered by insurance On metformin  XR 500 mg every day-causes diarrhea  Unable to tolerate lowest dose of slow release metformin  On ozempic  1 mg/week (starting weight 228 lbs, now at 203 lbs) No history of MEN syndrome/medullary thyroid  cancer/pancreatitis or pancreatic cancer in self or family  Grittiness improved, watery eyes still occurring  No known graves disease Previously, recommend ruling out sarcoidosis by PCP and seeing an ophthalmologist   I have reviewed current medications, nurse's notes, allergies, vital signs, past medical and surgical history, family medical history, and social history for this encounter. Counseled patient on symptoms, examination findings, lab findings, imaging  results, treatment decisions and monitoring and prognosis. The patient understood the recommendations and agrees with the treatment plan. All questions regarding treatment plan were fully answered.  Return in about 3 months (around 02/20/2024) for visit and 8 am labs now .   Obadiah Birmingham, MD  11/20/23   I have reviewed current medications, nurse's notes, allergies, vital signs, past medical and surgical history, family medical history, and social history for this encounter. Counseled patient on symptoms, examination findings, lab findings, imaging results, treatment decisions and monitoring and prognosis. The patient understood the recommendations and agrees with the treatment plan. All questions regarding treatment plan were fully answered.   History of Present Illness Laura [name] is a 48 y.o. year old female who presents to our clinic to follow up with multinodular goiter diagnosed around 2013 and grade III obesity (not grade II).    Symptoms suggestive of HYPOTHYROIDISM:  fatigue No weight gain No cold intolerance  No constipation  No  Symptoms suggestive of HYPERTHYROIDISM:  weight loss  No heat intolerance No hyperdefecation  No palpitations  No  Compressive symptoms:  dysphagia  No dysphonia  No positional dyspnea (especially with simultaneous arms elevation)  No  Smokes  No On biotin  No Personal history of head/neck surgery/irradiation  No  Never been on a thyroid  medication.  07/2023 Thyroid  US  images and report reviewed, reported thyromegaly with previously visualized solid cystic nodule in the left mid thyroid  (labeled 1, 1.4 cm, previously 1.8 cm) appears benign (TI-RADS category 2) and does not warrant additional follow-up  Unable to tolerate metformin  XR 500 mg every day-causes diarrhea   Physical Exam  BP 120/80  Pulse 88   Ht 5' (1.524 m)   Wt 203 lb (92.1 kg)   SpO2 98%   BMI 39.65 kg/m  Constitutional: well developed, well  nourished Head: normocephalic, atraumatic, no exophthalmos Eyes: sclera anicteric, no redness Neck: + thyromegaly, no thyroid  tenderness; + nodule Lungs: normal respiratory effort Neurology: alert and oriented, no fine hand tremor Skin: dry, no appreciable rashes Musculoskeletal: no appreciable defects Psychiatric: normal mood and affect  Allergies No Known Allergies  Current Medications Patient's Medications  New Prescriptions   SEMAGLUTIDE , 2 MG/DOSE, 8 MG/3ML SOPN    Inject 2 mg as directed once a week.  Previous Medications   ALBUTEROL  SULFATE (PROAIR  RESPICLICK) 108 (90 BASE) MCG/ACT AEPB    Take 1 puff by mouth daily as needed (use only as needed).   CALCIUM-VITAMIN D  (OSCAL WITH D) 500-5 MG-MCG TABLET    Take 1 tablet by mouth.   FERROUS SULFATE (FEOSOL PO)    Take 1 tablet by mouth daily.   LOSARTAN  (COZAAR ) 100 MG TABLET    Take 1 tablet (100 mg total) by mouth daily.   METFORMIN  (GLUCOPHAGE -XR) 500 MG 24 HR TABLET    Take 1 tablet (500 mg total) by mouth daily with breakfast.   METOPROLOL  SUCCINATE (TOPROL -XL) 100 MG 24 HR TABLET    Take 1 tablet (100 mg total) by mouth daily. Take with or immediately following a meal.   ONDANSETRON  (ZOFRAN ) 4 MG TABLET    Take 1 tablet (4 mg total) by mouth as directed. Take one Zofran  4 mg tablet 30-60 minutes before each colonoscopy prep dose  Modified Medications   No medications on file  Discontinued Medications   SEMAGLUTIDE , 1 MG/DOSE, 4 MG/3ML SOPN    Inject 1 mg as directed once a week.    Past Medical History Past Medical History:  Diagnosis Date   ALLERGIC RHINITIS 06/01/2008   Anemia    Anxiety 08/15/2010   ASTHMA 06/01/2008   Asthma    BURSITIS, LEFT HIP 04/28/2010   Diabetes mellitus without complication (HCC)    GERD (gastroesophageal reflux disease)    GOITER, MULTINODULAR 06/28/2008   Hirsutism 06/02/2009   HYPERTENSION 06/01/2008   Morbid (severe) obesity due to excess calories (HCC) 04/18/2023   bmi 42.77    Palpitations 07/25/2010   PERIPHERAL EDEMA 04/28/2010   Pharyngitis, acute 10/09/2010   TONSILLITIS, RECURRENT 05/05/2009    Past Surgical History Past Surgical History:  Procedure Laterality Date   bilateral turbiante reductions     CESAREAN SECTION  2007   DILATATION & CURETTAGE/HYSTEROSCOPY WITH MYOSURE N/A 07/22/2018   Procedure: DILATATION & CURETTAGE/HYSTEROSCOPY WITH MYOSURE XL;  Surgeon: Rutherford Gain, MD;  Location: [facility];  Service: Gynecology;  Laterality: N/A;   s/p sinus surgury  2006    Family History family history includes Hyperlipidemia in her maternal grandfather, maternal grandmother, and mother; Hypertension in her maternal grandfather, maternal grandmother, and mother; Lung cancer in her maternal grandfather.  Social History Social History   Socioeconomic History   Marital status: Married    Spouse name: Lorine Iannaccone   Number of children: 1   Years of education: Not on file   Highest education level: Bachelor's degree (e.g., BA, AB, BS)  Occupational History   Occupation: Teacher, adult education: UNEMPLOYED  Tobacco Use   Smoking status: Never   Smokeless tobacco: Never  Vaping Use   Vaping status: Never Used  Substance and Sexual Activity   Alcohol use: No   Drug use:  No   Sexual activity: Not on file  Other Topics Concern   Not on file  Social History Narrative   Not on file   Social Drivers of Health   Financial Resource Strain: Patient Declined (01/21/2023)   Overall Financial Resource Strain (CARDIA)    Difficulty of Paying Living Expenses: Patient declined  Food Insecurity: Patient Declined (01/21/2023)   Hunger Vital Sign    Worried About Running Out of Food in the Last Year: Patient declined    Ran Out of Food in the Last Year: Patient declined  Transportation Needs: Patient Declined (01/21/2023)   PRAPARE - Administrator, Civil Service (Medical): Patient declined    Lack of Transportation  (Non-Medical): Patient declined  Physical Activity: Unknown (01/21/2023)   Exercise Vital Sign    Days of Exercise per Week: Patient declined    Minutes of Exercise per Session: Not on file  Stress: No Stress Concern Present (01/21/2023)   [employer] of Occupational Health - Occupational Stress Questionnaire    Feeling of Stress : Not at all  Social Connections: Unknown (01/21/2023)   Social Connection and Isolation Panel    Frequency of Communication with Friends and Family: Patient declined    Frequency of Social Gatherings with Friends and Family: Patient declined    Attends Religious Services: Patient declined    Active Member of Clubs or Organizations: Patient declined    Attends Banker Meetings: Not on file    Marital Status: Married  Intimate Partner Violence: Not on file    Laboratory Investigations Lab Results  Component Value Date   TSH 1.24 07/25/2023   TSH 0.96 01/21/2023   TSH 1.29 03/28/2020   FREET4 1.1 07/25/2023   FREET4 0.82 10/11/2017   FREET4 0.81 06/25/2014     No results found for: TSI   No components found for: TRAB   Lab Results  Component Value Date   CHOL 188 01/21/2023   Lab Results  Component Value Date   HDL 47.40 01/21/2023   Lab Results  Component Value Date   LDLCALC 118 (H) 01/21/2023   Lab Results  Component Value Date   TRIG 115.0 01/21/2023   Lab Results  Component Value Date   CHOLHDL 4 01/21/2023   Lab Results  Component Value Date   CREATININE 0.72 01/21/2023   Lab Results  Component Value Date   GFR 99.30 01/21/2023      Component Value Date/Time   NA 136 01/21/2023 1454   NA 138 06/25/2018 1247   K 3.9 01/21/2023 1454   CL 103 01/21/2023 1454   CO2 27 01/21/2023 1454   GLUCOSE 120 (H) 01/21/2023 1454   BUN 11 01/21/2023 1454   BUN 10 06/25/2018 1247   CREATININE 0.72 01/21/2023 1454   CALCIUM 9.4 01/21/2023 1454   PROT 7.1 01/21/2023 1454   PROT 6.8 06/25/2018 1247   ALBUMIN 4.1  01/21/2023 1454   ALBUMIN 4.4 06/25/2018 1247   AST 13 01/21/2023 1454   ALT 18 01/21/2023 1454   ALKPHOS 55 01/21/2023 1454   BILITOT 0.3 01/21/2023 1454   BILITOT 0.4 06/25/2018 1247   GFRNONAA >60 07/17/2018 1429   GFRAA >60 07/17/2018 1429      Latest Ref Rng & Units 01/21/2023    2:54 PM 08/12/2020   12:18 PM 07/17/2018    2:29 PM  BMP  Glucose 70 - 99 mg/dL 879  91  88   BUN 6 - 23 mg/dL 11  11  17   Creatinine 0.40 - 1.20 mg/dL 9.27  9.33  9.25   Sodium 135 - 145 mEq/L 136  137  136   Potassium 3.5 - 5.1 mEq/L 3.9  3.9  4.0   Chloride 96 - 112 mEq/L 103  104  104   CO2 19 - 32 mEq/L 27  26  23    Calcium 8.4 - 10.5 mg/dL 9.4  9.2  9.5        Component Value Date/Time   WBC 6.1 01/21/2023 1454   RBC 4.48 01/21/2023 1454   HGB 14.0 01/21/2023 1454   HCT 43.0 01/21/2023 1454   PLT 204.0 01/21/2023 1454   MCV 95.9 01/21/2023 1454   MCH 32.1 07/17/2018 1429   MCHC 32.5 01/21/2023 1454   RDW 12.8 01/21/2023 1454   LYMPHSABS 1.6 01/21/2023 1454   MONOABS 0.7 01/21/2023 1454   EOSABS 0.2 01/21/2023 1454   BASOSABS 0.0 01/21/2023 1454      Parts of this note may have been dictated using voice recognition software. There may be variances in spelling and vocabulary which are unintentional. Not all errors are proofread. Please notify the [facility]ino if any discrepancies are noted or if the meaning of any statement is not clear.

## 2023-11-22 ENCOUNTER — Other Ambulatory Visit

## 2023-11-23 DIAGNOSIS — F4323 Adjustment disorder with mixed anxiety and depressed mood: Secondary | ICD-10-CM | POA: Diagnosis not present

## 2023-11-26 LAB — LIPID PANEL
Cholesterol: 171 mg/dL (ref <200)
HDL: 48 mg/dL — ABNORMAL LOW (ref >=50)
LDL Cholesterol (Calc): 105 mg/dL — ABNORMAL HIGH
Non-HDL Cholesterol (Calc): 123 mg/dL (ref <130)
Total CHOL/HDL Ratio: 3.6 (calc) (ref <5.0)
Triglycerides: 86 mg/dL (ref <150)

## 2023-11-26 LAB — COMPREHENSIVE METABOLIC PANEL WITH GFR
AG Ratio: 1.5 (calc) (ref 1.0–2.5)
ALT: 15 U/L (ref 6–29)
AST: 11 U/L (ref 10–35)
Albumin: 4 g/dL (ref 3.6–5.1)
Alkaline phosphatase (APISO): 45 U/L (ref 31–125)
BUN: 11 mg/dL (ref 7–25)
CO2: 27 mmol/L (ref 20–32)
Calcium: 9.3 mg/dL (ref 8.6–10.2)
Chloride: 105 mmol/L (ref 98–110)
Creat: 0.75 mg/dL (ref 0.50–0.99)
Globulin: 2.6 g/dL (ref 1.9–3.7)
Glucose, Bld: 94 mg/dL (ref 65–99)
Potassium: 4.3 mmol/L (ref 3.5–5.3)
Sodium: 139 mmol/L (ref 135–146)
Total Bilirubin: 0.3 mg/dL (ref 0.2–1.2)
Total Protein: 6.6 g/dL (ref 6.1–8.1)
eGFR: 98 mL/min/{1.73_m2} (ref >=60)

## 2023-11-26 LAB — THYROID STIMULATING IMMUNOGLOBULIN: TSI: <89 %{baseline} (ref <140)

## 2023-11-26 LAB — TRAB (TSH RECEPTOR BINDING ANTIBODY): TRAB: <1 IU/L (ref <=2.00)

## 2023-11-26 LAB — MICROALBUMIN / CREATININE URINE RATIO
Creatinine, Urine: 220 mg/dL (ref 20–275)
Microalb Creat Ratio: 1 mg/g{creat} (ref <30)
Microalb, Ur: 0.3 mg/dL

## 2023-11-26 LAB — HEMOGLOBIN A1C
Hgb A1c MFr Bld: 5.4 % (ref <5.7)
Mean Plasma Glucose: 108 mg/dL
eAG (mmol/L): 6 mmol/L

## 2023-12-01 ENCOUNTER — Other Ambulatory Visit: Payer: Self-pay | Admitting: "Endocrinology

## 2024-01-15 ENCOUNTER — Other Ambulatory Visit: Payer: Self-pay | Admitting: Internal Medicine

## 2024-01-15 DIAGNOSIS — I10 Essential (primary) hypertension: Secondary | ICD-10-CM

## 2024-02-20 ENCOUNTER — Ambulatory Visit: Admitting: "Endocrinology

## 2024-02-28 ENCOUNTER — Ambulatory Visit: Admitting: "Endocrinology

## 2024-02-28 ENCOUNTER — Encounter: Payer: Self-pay | Admitting: "Endocrinology

## 2024-02-28 VITALS — BP 122/80 | HR 98 | Resp 20 | Ht 60.0 in | Wt 185.2 lb

## 2024-02-28 DIAGNOSIS — E119 Type 2 diabetes mellitus without complications: Secondary | ICD-10-CM | POA: Diagnosis not present

## 2024-02-28 DIAGNOSIS — E041 Nontoxic single thyroid nodule: Secondary | ICD-10-CM

## 2024-02-28 DIAGNOSIS — E66812 Obesity, class 2: Secondary | ICD-10-CM | POA: Diagnosis not present

## 2024-02-28 DIAGNOSIS — E7800 Pure hypercholesterolemia, unspecified: Secondary | ICD-10-CM

## 2024-02-28 DIAGNOSIS — Z6839 Body mass index (BMI) 39.0-39.9, adult: Secondary | ICD-10-CM

## 2024-02-28 LAB — POCT GLYCOSYLATED HEMOGLOBIN (HGB A1C): Hemoglobin A1C: 5 % (ref 4.0–5.6)

## 2024-02-28 MED ORDER — ROSUVASTATIN CALCIUM 5 MG PO TABS: 5.0000 mg | ORAL_TABLET | Freq: Every day | ORAL | 3 refills | Status: AC

## 2024-02-28 MED ORDER — SEMAGLUTIDE (2 MG/DOSE) 8 MG/3ML ~~LOC~~ SOPN: 2.0000 mg | PEN_INJECTOR | SUBCUTANEOUS | 1 refills | Status: AC

## 2024-02-28 NOTE — Progress Notes (Signed)
 Outpatient Endocrinology Note Obadiah Birmingham, MD  02/28/24   Laura [name] Jan 28, 1976 [id number]  Referring Provider: Norleen Lynwood ORN, MD Primary Care Provider: Norleen Lynwood ORN, MD Subjective  No chief complaint on file.  Assessment & Plan  Diagnoses and all orders for this visit:  Controlled type 2 diabetes mellitus without complication (HCC) -     POCT glycosylated hemoglobin (Hb A1C) -     rosuvastatin (CRESTOR) 5 MG tablet; Take 1 tablet (5 mg total) by mouth daily. -     Semaglutide , 2 MG/DOSE, 8 MG/3ML SOPN; Inject 2 mg as directed once a week.  Pure hypercholesterolemia  Class 2 severe obesity due to excess calories with serious comorbidity and body mass index (BMI) of 39.0 to 39.9 in adult  Uninodular goiter   Laura [name] has never been on any thyroid  medication. Patient is currently biochemically euthyroid.  Educated on thyroid  axis.  No history of FNA for multinodular  Last thyroid  U/S in 2019: has multiple nodules, biggest was Left; Mid 1.8 cm x 1.5 x 0.8 cm 07/2023 Thyroid  US  images and report reviewed, reported thyromegaly with previously visualized solid cystic nodule in the left mid thyroid  (labeled 1, 1.4 cm, previously 1.8 cm) appears benign (TI-RADS category 2) and does not warrant additional follow-up Asymptomatic currently  Initial: Morbid obesity with elevated last A1C at 7.6% and elevated LDL Previously benefited from ozempic , but now not covered by insurance Stopped metformin  XR 500 mg every day due to diarrhea  Unable to tolerate lowest dose of slow release metformin  On ozempic  2 mg/week (starting weight 228 lbs, now at  Last Weight  Most recent update: 02/28/2024  8:18 AM    Weight  84 kg (185 lb 3.2 oz)            On losartan  100mg  every day  No history of MEN syndrome/medullary thyroid  cancer/pancreatitis or pancreatic cancer in self or family Lab Results  Component Value Date   HGBA1C 5.0 02/28/2024   HGBA1C 5.4  11/22/2023   HGBA1C 7.6 (H) 01/21/2023    Grittiness improved, watery eyes still occurring  No known graves disease Previously, recommend ruling out sarcoidosis by PCP and seeing an ophthalmologist   I have reviewed current medications, nurse's notes, allergies, vital signs, past medical and surgical history, family medical history, and social history for this encounter. Counseled patient on symptoms, examination findings, lab findings, imaging results, treatment decisions and monitoring and prognosis. The patient understood the recommendations and agrees with the treatment plan. All questions regarding treatment plan were fully answered.  Return in about 6 months (around 08/28/2024).   Obadiah Birmingham, MD  02/28/24   I have reviewed current medications, nurse's notes, allergies, vital signs, past medical and surgical history, family medical history, and social history for this encounter. Counseled patient on symptoms, examination findings, lab findings, imaging results, treatment decisions and monitoring and prognosis. The patient understood the recommendations and agrees with the treatment plan. All questions regarding treatment plan were fully answered.   History of Present Illness Laura [name] is a 48 y.o. year old female who presents to our clinic to follow up with multinodular goiter diagnosed around 2013 and grade III obesity (not grade II).    Symptoms suggestive of HYPOTHYROIDISM:  fatigue No weight gain No cold intolerance  No constipation  No  Symptoms suggestive of HYPERTHYROIDISM:  weight loss  No heat intolerance No hyperdefecation  No palpitations  No  Compressive symptoms:  dysphagia  No  dysphonia  No positional dyspnea (especially with simultaneous arms elevation)  No  Smokes  No On biotin  No Personal history of head/neck surgery/irradiation  No  Never been on a thyroid  medication.  07/2023 Thyroid  US  images and report reviewed, reported thyromegaly  with previously visualized solid cystic nodule in the left mid thyroid  (labeled 1, 1.4 cm, previously 1.8 cm) appears benign (TI-RADS category 2) and does not warrant additional follow-up  Unable to tolerate metformin  XR 500 mg every day-causes diarrhea  No known nephropathy/neuropathy/retinopathy/MI/stroke On ozempic  2 mg/week No history of MEN syndrome/medullary thyroid  cancer/pancreatitis or pancreatic cancer in self or family   Physical Exam  BP 122/80   Pulse 98   Resp 20   Ht 5' (1.524 m)   Wt 185 lb 3.2 oz (84 kg)   SpO2 98%   BMI 36.17 kg/m  Constitutional: well developed, well nourished Head: normocephalic, atraumatic, no exophthalmos Eyes: sclera anicteric, no redness Neck: + thyromegaly, no thyroid  tenderness; + nodule Lungs: normal respiratory effort Neurology: alert and oriented, no fine hand tremor Skin: dry, no appreciable rashes Musculoskeletal: no appreciable defects Psychiatric: normal mood and affect Diabetic Foot Exam - Simple   Simple Foot Form Diabetic Foot exam was performed with the following findings: Yes 02/28/2024  8:27 AM  Visual Inspection No deformities, no ulcerations, no other skin breakdown bilaterally: Yes Sensation Testing Intact to touch and monofilament testing bilaterally: Yes Pulse Check Posterior Tibialis and Dorsalis pulse intact bilaterally: Yes Comments     Allergies No Known Allergies  Current Medications Patient's Medications  New Prescriptions   ROSUVASTATIN (CRESTOR) 5 MG TABLET    Take 1 tablet (5 mg total) by mouth daily.  Previous Medications   ALBUTEROL  SULFATE (PROAIR  RESPICLICK) 108 (90 BASE) MCG/ACT AEPB    Take 1 puff by mouth daily as needed (use only as needed).   CALCIUM-VITAMIN D  (OSCAL WITH D) 500-5 MG-MCG TABLET    Take 1 tablet by mouth.   FERROUS SULFATE (FEOSOL PO)    Take 1 tablet by mouth daily.   LOSARTAN  (COZAAR ) 100 MG TABLET    TAKE 1 TABLET BY MOUTH EVERY DAY   METOPROLOL  SUCCINATE (TOPROL -XL)  100 MG 24 HR TABLET    TAKE 1 TABLET BY MOUTH DAILY. TAKE WITH OR IMMEDIATELY FOLLOWING A MEAL.  Modified Medications   Modified Medication Previous Medication   SEMAGLUTIDE , 2 MG/DOSE, 8 MG/3ML SOPN Semaglutide , 2 MG/DOSE, 8 MG/3ML SOPN      Inject 2 mg as directed once a week.    Inject 2 mg as directed once a week.  Discontinued Medications   METFORMIN  (GLUCOPHAGE -XR) 500 MG 24 HR TABLET    Take 1 tablet (500 mg total) by mouth daily with breakfast.   ONDANSETRON  (ZOFRAN ) 4 MG TABLET    Take 1 tablet (4 mg total) by mouth as directed. Take one Zofran  4 mg tablet 30-60 minutes before each colonoscopy prep dose    Past Medical History Past Medical History:  Diagnosis Date   ALLERGIC RHINITIS 06/01/2008   Anemia    Anxiety 08/15/2010   ASTHMA 06/01/2008   Asthma    BURSITIS, LEFT HIP 04/28/2010   Diabetes mellitus without complication (HCC)    GERD (gastroesophageal reflux disease)    GOITER, MULTINODULAR 06/28/2008   Hirsutism 06/02/2009   HYPERTENSION 06/01/2008   Morbid (severe) obesity due to excess calories (HCC) 04/18/2023   bmi 42.77   Palpitations 07/25/2010   PERIPHERAL EDEMA 04/28/2010   Pharyngitis, acute 10/09/2010   TONSILLITIS,  RECURRENT 05/05/2009    Past Surgical History Past Surgical History:  Procedure Laterality Date   bilateral turbiante reductions     CESAREAN SECTION  2007   DILATATION & CURETTAGE/HYSTEROSCOPY WITH MYOSURE N/A 07/22/2018   Procedure: DILATATION & CURETTAGE/HYSTEROSCOPY WITH MYOSURE XL;  Surgeon: Rutherford Gain, MD;  Location: [facility];  Service: Gynecology;  Laterality: N/A;   s/p sinus surgury  2006    Family History family history includes Hyperlipidemia in her maternal grandfather, maternal grandmother, and mother; Hypertension in her maternal grandfather, maternal grandmother, and mother; Lung cancer in her maternal grandfather.  Social History Social History   Socioeconomic History   Marital status:  Married    Spouse name: Tye Juarez   Number of children: 1   Years of education: Not on file   Highest education level: Bachelor's degree (e.g., BA, AB, BS)  Occupational History   Occupation: Teacher, adult education: UNEMPLOYED  Tobacco Use   Smoking status: Never   Smokeless tobacco: Never  Vaping Use   Vaping status: Never Used  Substance and Sexual Activity   Alcohol use: No   Drug use: No   Sexual activity: Not on file  Other Topics Concern   Not on file  Social History Narrative   Not on file   Social Drivers of Health   Financial Resource Strain: Patient Declined (01/21/2023)   Overall Financial Resource Strain (CARDIA)    Difficulty of Paying Living Expenses: Patient declined  Food Insecurity: Patient Declined (01/21/2023)   Hunger Vital Sign    Worried About Running Out of Food in the Last Year: Patient declined    Ran Out of Food in the Last Year: Patient declined  Transportation Needs: Patient Declined (01/21/2023)   PRAPARE - Administrator, Civil Service (Medical): Patient declined    Lack of Transportation (Non-Medical): Patient declined  Physical Activity: Unknown (01/21/2023)   Exercise Vital Sign    Days of Exercise per Week: Patient declined    Minutes of Exercise per Session: Not on file  Stress: No Stress Concern Present (01/21/2023)   [employer] of Occupational Health - Occupational Stress Questionnaire    Feeling of Stress : Not at all  Social Connections: Unknown (01/21/2023)   Social Connection and Isolation Panel    Frequency of Communication with Friends and Family: Patient declined    Frequency of Social Gatherings with Friends and Family: Patient declined    Attends Religious Services: Patient declined    Active Member of Clubs or Organizations: Patient declined    Attends Banker Meetings: Not on file    Marital Status: Married  Intimate Partner Violence: Not on file    Laboratory Investigations Lab Results   Component Value Date   TSH 1.24 07/25/2023   TSH 0.96 01/21/2023   TSH 1.29 03/28/2020   FREET4 1.1 07/25/2023   FREET4 0.82 10/11/2017   FREET4 0.81 06/25/2014     Lab Results  Component Value Date   TSI <89 11/22/2023     No components found for: TRAB   Lab Results  Component Value Date   CHOL 171 11/22/2023   Lab Results  Component Value Date   HDL 48 (L) 11/22/2023   Lab Results  Component Value Date   LDLCALC 105 (H) 11/22/2023   Lab Results  Component Value Date   TRIG 86 11/22/2023   Lab Results  Component Value Date   CHOLHDL 3.6 11/22/2023   Lab Results  Component  Value Date   CREATININE 0.75 11/22/2023   Lab Results  Component Value Date   GFR 99.30 01/21/2023      Component Value Date/Time   NA 139 11/22/2023 0815   NA 138 06/25/2018 1247   K 4.3 11/22/2023 0815   CL 105 11/22/2023 0815   CO2 27 11/22/2023 0815   GLUCOSE 94 11/22/2023 0815   BUN 11 11/22/2023 0815   BUN 10 06/25/2018 1247   CREATININE 0.75 11/22/2023 0815   CALCIUM 9.3 11/22/2023 0815   PROT 6.6 11/22/2023 0815   PROT 6.8 06/25/2018 1247   ALBUMIN 4.1 01/21/2023 1454   ALBUMIN 4.4 06/25/2018 1247   AST 11 11/22/2023 0815   ALT 15 11/22/2023 0815   ALKPHOS 55 01/21/2023 1454   BILITOT 0.3 11/22/2023 0815   BILITOT 0.4 06/25/2018 1247   GFRNONAA >60 07/17/2018 1429   GFRAA >60 07/17/2018 1429      Latest Ref Rng & Units 11/22/2023    8:15 AM 01/21/2023    2:54 PM 08/12/2020   12:18 PM  BMP  Glucose 65 - 99 mg/dL 94  879  91   BUN 7 - 25 mg/dL 11  11  11    Creatinine 0.50 - 0.99 mg/dL 9.24  9.27  9.33   BUN/Creat Ratio 6 - 22 (calc) SEE NOTE:     Sodium 135 - 146 mmol/L 139  136  137   Potassium 3.5 - 5.3 mmol/L 4.3  3.9  3.9   Chloride 98 - 110 mmol/L 105  103  104   CO2 20 - 32 mmol/L 27  27  26    Calcium 8.6 - 10.2 mg/dL 9.3  9.4  9.2        Component Value Date/Time   WBC 6.1 01/21/2023 1454   RBC 4.48 01/21/2023 1454   HGB 14.0 01/21/2023 1454   HCT  43.0 01/21/2023 1454   PLT 204.0 01/21/2023 1454   MCV 95.9 01/21/2023 1454   MCH 32.1 07/17/2018 1429   MCHC 32.5 01/21/2023 1454   RDW 12.8 01/21/2023 1454   LYMPHSABS 1.6 01/21/2023 1454   MONOABS 0.7 01/21/2023 1454   EOSABS 0.2 01/21/2023 1454   BASOSABS 0.0 01/21/2023 1454      Parts of this note may have been dictated using voice recognition software. There may be variances in spelling and vocabulary which are unintentional. Not all errors are proofread. Please notify the dino if any discrepancies are noted or if the meaning of any statement is not clear.

## 2024-04-21 ENCOUNTER — Other Ambulatory Visit: Payer: Self-pay

## 2024-04-21 ENCOUNTER — Other Ambulatory Visit: Payer: Self-pay | Admitting: Internal Medicine

## 2024-04-21 DIAGNOSIS — I10 Essential (primary) hypertension: Secondary | ICD-10-CM

## 2024-05-11 ENCOUNTER — Telehealth: Payer: Self-pay

## 2024-05-11 NOTE — Telephone Encounter (Signed)
 Copied from CRM #[id number]. Topic: Appointments - Scheduling Inquiry for Clinic >> May 08, 2024 12:16 PM Anairis L wrote: Reason for CRM: Laura [name] is a current patient of Dr. Lynwood rush and would like to see if Dr.Burns or Dr.Crawford would take her on as a new patient.  Please advise.

## 2024-05-11 NOTE — Telephone Encounter (Signed)
 Ok with me, but request would need to be approved by the new provider as well, thanks

## 2024-05-14 NOTE — Telephone Encounter (Signed)
 Unfortunately at this time I cannot accept any more patients.

## 2024-05-15 NOTE — Telephone Encounter (Signed)
Not taking new patients sorry.  

## 2024-05-18 NOTE — Telephone Encounter (Signed)
 Pt has been scheduled for TOC with Corean, FNP 09/24/24. Pt is aware and advised to call if she needs anything sooner. Pt verbalized understanding.

## 2024-06-17 ENCOUNTER — Telehealth (HOSPITAL_COMMUNITY): Payer: Self-pay | Admitting: *Deleted

## 2024-06-17 ENCOUNTER — Other Ambulatory Visit: Payer: Self-pay

## 2024-06-17 ENCOUNTER — Encounter (HOSPITAL_COMMUNITY): Payer: Self-pay | Admitting: Obstetrics and Gynecology

## 2024-06-17 NOTE — Telephone Encounter (Signed)
 Open in error

## 2024-06-19 ENCOUNTER — Encounter (HOSPITAL_COMMUNITY)
Admission: RE | Disposition: A | Discharge status: Home / Self Care | Payer: Self-pay | Source: Home / Self Care | Attending: Obstetrics and Gynecology

## 2024-06-19 ENCOUNTER — Ambulatory Visit (HOSPITAL_COMMUNITY)
Admission: RE | Admit: 2024-06-19 | Discharge: 2024-06-19 | Disposition: A | Discharge status: Home / Self Care | Attending: Obstetrics and Gynecology | Admitting: Obstetrics and Gynecology

## 2024-06-19 ENCOUNTER — Ambulatory Visit (HOSPITAL_COMMUNITY): Admitting: Anesthesiology

## 2024-06-19 ENCOUNTER — Other Ambulatory Visit: Payer: Self-pay

## 2024-06-19 ENCOUNTER — Encounter (HOSPITAL_COMMUNITY): Payer: Self-pay | Admitting: Obstetrics and Gynecology

## 2024-06-19 DIAGNOSIS — J45909 Unspecified asthma, uncomplicated: Secondary | ICD-10-CM | POA: Diagnosis not present

## 2024-06-19 DIAGNOSIS — N920 Excessive and frequent menstruation with regular cycle: Secondary | ICD-10-CM | POA: Diagnosis not present

## 2024-06-19 DIAGNOSIS — E119 Type 2 diabetes mellitus without complications: Secondary | ICD-10-CM | POA: Insufficient documentation

## 2024-06-19 DIAGNOSIS — D251 Intramural leiomyoma of uterus: Secondary | ICD-10-CM

## 2024-06-19 DIAGNOSIS — I10 Essential (primary) hypertension: Secondary | ICD-10-CM | POA: Diagnosis not present

## 2024-06-19 DIAGNOSIS — N736 Female pelvic peritoneal adhesions (postinfective): Secondary | ICD-10-CM | POA: Insufficient documentation

## 2024-06-19 DIAGNOSIS — D252 Subserosal leiomyoma of uterus: Secondary | ICD-10-CM

## 2024-06-19 DIAGNOSIS — K219 Gastro-esophageal reflux disease without esophagitis: Secondary | ICD-10-CM | POA: Insufficient documentation

## 2024-06-19 DIAGNOSIS — N888 Other specified noninflammatory disorders of cervix uteri: Secondary | ICD-10-CM | POA: Diagnosis not present

## 2024-06-19 DIAGNOSIS — D250 Submucous leiomyoma of uterus: Secondary | ICD-10-CM | POA: Diagnosis not present

## 2024-06-19 DIAGNOSIS — N921 Excessive and frequent menstruation with irregular cycle: Secondary | ICD-10-CM | POA: Diagnosis not present

## 2024-06-19 DIAGNOSIS — Z01818 Encounter for other preprocedural examination: Secondary | ICD-10-CM

## 2024-06-19 DIAGNOSIS — Z90710 Acquired absence of both cervix and uterus: Secondary | ICD-10-CM | POA: Diagnosis present

## 2024-06-19 DIAGNOSIS — F419 Anxiety disorder, unspecified: Secondary | ICD-10-CM | POA: Diagnosis not present

## 2024-06-19 LAB — CBC
HCT: 41.9 % (ref 36.0–46.0)
HCT: 41.8 % (ref 36.0–46.0)
Hemoglobin: 14.2 g/dL (ref 12.0–15.0)
Hemoglobin: 14.2 g/dL (ref 12.0–15.0)
MCH: 32.3 pg (ref 26.0–34.0)
MCH: 32.3 pg (ref 26.0–34.0)
MCHC: 33.9 g/dL (ref 30.0–36.0)
MCHC: 34 g/dL (ref 30.0–36.0)
MCV: 95.4 fL (ref 80.0–100.0)
MCV: 95 fL (ref 80.0–100.0)
Platelets: 186 K/uL (ref 150–400)
Platelets: 221 K/uL (ref 150–400)
RBC: 4.39 MIL/uL (ref 3.87–5.11)
RBC: 4.4 MIL/uL (ref 3.87–5.11)
RDW: 11.9 % (ref 11.5–15.5)
RDW: 11.9 % (ref 11.5–15.5)
WBC: 3 K/uL — ABNORMAL LOW (ref 4.0–10.5)
WBC: 10.8 K/uL — ABNORMAL HIGH (ref 4.0–10.5)
nRBC: 0 % (ref 0.0–0.2)
nRBC: 0 % (ref 0.0–0.2)

## 2024-06-19 LAB — BASIC METABOLIC PANEL WITH GFR
Anion gap: 11 (ref 5–15)
Anion gap: 10 (ref 5–15)
BUN: 9 mg/dL (ref 6–20)
BUN: 9 mg/dL (ref 6–20)
CO2: 24 mmol/L (ref 22–32)
CO2: 24 mmol/L (ref 22–32)
Calcium: 8.9 mg/dL (ref 8.9–10.3)
Calcium: 8.6 mg/dL — ABNORMAL LOW (ref 8.9–10.3)
Chloride: 104 mmol/L (ref 98–111)
Chloride: 102 mmol/L (ref 98–111)
Creatinine, Ser: 0.86 mg/dL (ref 0.44–1.00)
Creatinine, Ser: 0.84 mg/dL (ref 0.44–1.00)
GFR, Estimated: >60 mL/min
GFR, Estimated: >60 mL/min
Glucose, Bld: 89 mg/dL (ref 70–99)
Glucose, Bld: 183 mg/dL — ABNORMAL HIGH (ref 70–99)
Potassium: 4 mmol/L (ref 3.5–5.1)
Potassium: 4.6 mmol/L (ref 3.5–5.1)
Sodium: 138 mmol/L (ref 135–145)
Sodium: 135 mmol/L (ref 135–145)

## 2024-06-19 LAB — POCT PREGNANCY, URINE: Preg Test, Ur: NEGATIVE

## 2024-06-19 LAB — TYPE AND SCREEN
ABO/RH(D): B POS
Antibody Screen: NEGATIVE

## 2024-06-19 LAB — GLUCOSE, CAPILLARY
Glucose-Capillary: 83 mg/dL (ref 70–99)
Glucose-Capillary: 99 mg/dL (ref 70–99)

## 2024-06-19 LAB — ABO/RH: ABO/RH(D): B POS

## 2024-06-19 MED ORDER — PROPOFOL 10 MG/ML IV BOLUS
INTRAVENOUS | Status: DC | PRN
  Administered 2024-06-19: 200 mg via INTRAVENOUS

## 2024-06-19 MED ORDER — LACTATED RINGERS IV SOLN: INTRAVENOUS | Status: DC

## 2024-06-19 MED ORDER — DEXMEDETOMIDINE HCL IN NACL 80 MCG/20ML IV SOLN
INTRAVENOUS | Status: DC | PRN
  Administered 2024-06-19: 4 ug via INTRAVENOUS
  Administered 2024-06-19: 8 ug via INTRAVENOUS

## 2024-06-19 MED ORDER — ONDANSETRON HCL 4 MG/2ML IJ SOLN
INTRAMUSCULAR | Status: AC
  Filled 2024-06-19: qty 2

## 2024-06-19 MED ORDER — ONDANSETRON HCL 4 MG/2ML IJ SOLN
INTRAMUSCULAR | Status: DC | PRN
  Administered 2024-06-19: 4 mg via INTRAVENOUS

## 2024-06-19 MED ORDER — ONDANSETRON HCL 4 MG/2ML IJ SOLN: 4.0000 mg | Freq: Four times a day (QID) | INTRAMUSCULAR | Status: DC | PRN

## 2024-06-19 MED ORDER — LIDOCAINE 2% (20 MG/ML) 5 ML SYRINGE
INTRAMUSCULAR | Status: AC
  Filled 2024-06-19: qty 5

## 2024-06-19 MED ORDER — 0.9 % SODIUM CHLORIDE (POUR BTL) OPTIME
TOPICAL | Status: DC | PRN
  Administered 2024-06-19: 1000 mL

## 2024-06-19 MED ORDER — OXYCODONE HCL 5 MG PO TABS: 5.0000 mg | ORAL_TABLET | ORAL | Status: DC | PRN

## 2024-06-19 MED ORDER — IBUPROFEN 600 MG PO TABS: 600.0000 mg | ORAL_TABLET | Freq: Four times a day (QID) | ORAL | Status: DC

## 2024-06-19 MED ORDER — CEFAZOLIN SODIUM-DEXTROSE 2-4 GM/100ML-% IV SOLN
INTRAVENOUS | Status: AC
  Filled 2024-06-19: qty 100

## 2024-06-19 MED ORDER — GLYCOPYRROLATE PF 0.2 MG/ML IJ SOSY
PREFILLED_SYRINGE | INTRAMUSCULAR | Status: AC
  Filled 2024-06-19: qty 1

## 2024-06-19 MED ORDER — OXYCODONE HCL 5 MG PO TABS: 5.0000 mg | ORAL_TABLET | Freq: Once | ORAL | Status: DC | PRN

## 2024-06-19 MED ORDER — LACTATED RINGERS IV SOLN: INTRAVENOUS | Status: DC | PRN

## 2024-06-19 MED ORDER — MIDAZOLAM HCL 2 MG/2ML IJ SOLN
INTRAMUSCULAR | Status: AC
  Filled 2024-06-19: qty 2

## 2024-06-19 MED ORDER — IBUPROFEN 600 MG PO TABS: 600.0000 mg | ORAL_TABLET | Freq: Four times a day (QID) | ORAL | 2 refills | Status: AC

## 2024-06-19 MED ORDER — SUGAMMADEX SODIUM 200 MG/2ML IV SOLN
INTRAVENOUS | Status: DC | PRN
  Administered 2024-06-19: 200 mg via INTRAVENOUS

## 2024-06-19 MED ORDER — CHLORHEXIDINE GLUCONATE 0.12 % MT SOLN
OROMUCOSAL | Status: AC
  Filled 2024-06-19: qty 15

## 2024-06-19 MED ORDER — FENTANYL CITRATE (PF) 250 MCG/5ML IJ SOLN
INTRAMUSCULAR | Status: AC
  Filled 2024-06-19: qty 5

## 2024-06-19 MED ORDER — HEMOSTATIC AGENTS (NO CHARGE) OPTIME
TOPICAL | Status: DC | PRN
  Administered 2024-06-19 (×2): 1

## 2024-06-19 MED ORDER — ONDANSETRON HCL 4 MG PO TABS: 4.0000 mg | ORAL_TABLET | Freq: Four times a day (QID) | ORAL | Status: DC | PRN

## 2024-06-19 MED ORDER — CEFAZOLIN SODIUM-DEXTROSE 2-4 GM/100ML-% IV SOLN
2.0000 g | INTRAVENOUS | Status: AC
  Administered 2024-06-19: 2 g via INTRAVENOUS

## 2024-06-19 MED ORDER — PANTOPRAZOLE SODIUM 40 MG PO TBEC
40.0000 mg | DELAYED_RELEASE_TABLET | Freq: Every day | ORAL | Status: DC
  Administered 2024-06-19: 40 mg via ORAL
  Filled 2024-06-19: qty 1

## 2024-06-19 MED ORDER — ROCURONIUM BROMIDE 100 MG/10ML IV SOLN
INTRAVENOUS | Status: DC | PRN
  Administered 2024-06-19: 30 mg via INTRAVENOUS
  Administered 2024-06-19: 10 mg via INTRAVENOUS
  Administered 2024-06-19: 50 mg via INTRAVENOUS
  Administered 2024-06-19: 10 mg via INTRAVENOUS
  Administered 2024-06-19: 20 mg via INTRAVENOUS

## 2024-06-19 MED ORDER — FENTANYL CITRATE (PF) 100 MCG/2ML IJ SOLN
INTRAMUSCULAR | Status: DC | PRN
  Administered 2024-06-19 (×2): 50 ug via INTRAVENOUS
  Administered 2024-06-19: 100 ug via INTRAVENOUS
  Administered 2024-06-19: 50 ug via INTRAVENOUS

## 2024-06-19 MED ORDER — BUPIVACAINE HCL (PF) 0.25 % IJ SOLN
INTRAMUSCULAR | Status: DC | PRN
  Administered 2024-06-19: 13 mL

## 2024-06-19 MED ORDER — SOD CITRATE-CITRIC ACID 500-334 MG/5ML PO SOLN: 30.0000 mL | ORAL | Status: DC

## 2024-06-19 MED ORDER — OXYCODONE HCL 5 MG PO TABS: 5.0000 mg | ORAL_TABLET | ORAL | 0 refills | Status: AC | PRN

## 2024-06-19 MED ORDER — OXYCODONE HCL 5 MG/5ML PO SOLN: 5.0000 mg | Freq: Once | ORAL | Status: DC | PRN

## 2024-06-19 MED ORDER — ORAL CARE MOUTH RINSE: 15.0000 mL | Freq: Once | OROMUCOSAL | Status: AC

## 2024-06-19 MED ORDER — FENTANYL CITRATE (PF) 100 MCG/2ML IJ SOLN: 25.0000 ug | INTRAMUSCULAR | Status: DC | PRN

## 2024-06-19 MED ORDER — DEXTROSE IN LACTATED RINGERS 5 % IV SOLN: INTRAVENOUS | Status: DC

## 2024-06-19 MED ORDER — SIMETHICONE 80 MG PO CHEW: 80.0000 mg | CHEWABLE_TABLET | Freq: Four times a day (QID) | ORAL | Status: DC | PRN

## 2024-06-19 MED ORDER — KETOROLAC TROMETHAMINE 30 MG/ML IJ SOLN
INTRAMUSCULAR | Status: DC | PRN
  Administered 2024-06-19: 30 mg via INTRAVENOUS

## 2024-06-19 MED ORDER — DEXAMETHASONE SOD PHOSPHATE PF 10 MG/ML IJ SOLN
INTRAMUSCULAR | Status: DC | PRN
  Administered 2024-06-19: 8 mg via INTRAVENOUS

## 2024-06-19 MED ORDER — DEXAMETHASONE SOD PHOSPHATE PF 10 MG/ML IJ SOLN
INTRAMUSCULAR | Status: AC
  Filled 2024-06-19: qty 1

## 2024-06-19 MED ORDER — HYDROMORPHONE HCL 1 MG/ML IJ SOLN
INTRAMUSCULAR | Status: AC
  Filled 2024-06-19: qty 0.5

## 2024-06-19 MED ORDER — PROPOFOL 500 MG/50ML IV EMUL
INTRAVENOUS | Status: DC | PRN
  Administered 2024-06-19: 125 ug/kg/min via INTRAVENOUS

## 2024-06-19 MED ORDER — PHENYLEPHRINE 80 MCG/ML (10ML) SYRINGE FOR IV PUSH (FOR BLOOD PRESSURE SUPPORT)
PREFILLED_SYRINGE | INTRAVENOUS | Status: AC
  Filled 2024-06-19: qty 10

## 2024-06-19 MED ORDER — HYDROMORPHONE HCL 1 MG/ML IJ SOLN
INTRAMUSCULAR | Status: DC | PRN
  Administered 2024-06-19: .5 mg via INTRAVENOUS

## 2024-06-19 MED ORDER — LOSARTAN POTASSIUM 25 MG PO TABS
100.0000 mg | ORAL_TABLET | Freq: Every day | ORAL | Status: DC
  Administered 2024-06-19: 100 mg via ORAL
  Filled 2024-06-19: qty 4

## 2024-06-19 MED ORDER — SODIUM CHLORIDE 0.9 % IR SOLN
Status: DC | PRN
  Administered 2024-06-19: 1000 mL via INTRAVESICAL
  Administered 2024-06-19 (×2): 1000 mL

## 2024-06-19 MED ORDER — CHLORHEXIDINE GLUCONATE 0.12 % MT SOLN
15.0000 mL | Freq: Once | OROMUCOSAL | Status: AC
  Administered 2024-06-19: 15 mL via OROMUCOSAL

## 2024-06-19 MED ORDER — MIDAZOLAM HCL 5 MG/5ML IJ SOLN
INTRAMUSCULAR | Status: DC | PRN
  Administered 2024-06-19: 2 mg via INTRAVENOUS

## 2024-06-19 MED ORDER — ACETAMINOPHEN 500 MG PO TABS
1000.0000 mg | ORAL_TABLET | Freq: Four times a day (QID) | ORAL | Status: DC
  Administered 2024-06-19: 1000 mg via ORAL
  Filled 2024-06-19: qty 2

## 2024-06-19 MED ORDER — LIDOCAINE 2% (20 MG/ML) 5 ML SYRINGE
INTRAMUSCULAR | Status: DC | PRN
  Administered 2024-06-19: 50 mg via INTRAVENOUS

## 2024-06-19 MED ORDER — MENTHOL 3 MG MT LOZG: 1.0000 | LOZENGE | OROMUCOSAL | Status: DC | PRN

## 2024-06-19 MED ORDER — PHENYLEPHRINE 80 MCG/ML (10ML) SYRINGE FOR IV PUSH (FOR BLOOD PRESSURE SUPPORT)
PREFILLED_SYRINGE | INTRAVENOUS | Status: DC | PRN
  Administered 2024-06-19 (×2): 80 ug via INTRAVENOUS

## 2024-06-19 MED ORDER — ROCURONIUM BROMIDE 10 MG/ML (PF) SYRINGE
PREFILLED_SYRINGE | INTRAVENOUS | Status: AC
  Filled 2024-06-19: qty 10

## 2024-06-19 MED ORDER — POVIDONE-IODINE 10 % EX SWAB
2.0000 | Freq: Once | CUTANEOUS | Status: AC
  Administered 2024-06-19: 2 via TOPICAL

## 2024-06-19 NOTE — Anesthesia Procedure Notes (Addendum)
 Procedure Name: Intubation Date/Time: 06/19/2024 12:10 PM  Performed by: Denton Niels CROME, CRNAPre-anesthesia Checklist: Patient identified, Emergency Drugs available, Suction available, Timeout performed and Patient being monitored Patient Re-evaluated:Patient Re-evaluated prior to induction Oxygen Delivery Method: Circle system utilized Preoxygenation: Pre-oxygenation with 100% oxygen Induction Type: IV induction Ventilation: Mask ventilation without difficulty Laryngoscope Size: Mac and 3 Grade View: Grade I Tube type: Oral Tube size: 7.0 mm Number of attempts: 1 Airway Equipment and Method: Stylet Placement Confirmation: ETT inserted through vocal cords under direct vision, positive ETCO2, CO2 detector and breath sounds checked- equal and bilateral Secured at: 22 cm Tube secured with: Tape Dental Injury: Teeth and Oropharynx as per pre-operative assessment

## 2024-06-19 NOTE — Anesthesia Preprocedure Evaluation (Signed)
"                                    Anesthesia Evaluation  Patient identified by MRN, date of birth, ID band Patient awake    Reviewed: Allergy & Precautions, H&P , NPO status , Patient's Chart, lab work & pertinent test results  Airway Mallampati: II   Neck ROM: full    Dental   Pulmonary asthma    breath sounds clear to auscultation       Cardiovascular hypertension,  Rhythm:regular Rate:Normal     Neuro/Psych   Anxiety        GI/Hepatic ,GERD  ,,  Endo/Other  diabetes, Type 2    Renal/GU      Musculoskeletal   Abdominal   Peds  Hematology   Anesthesia Other Findings   Reproductive/Obstetrics                              Anesthesia Physical Anesthesia Plan  ASA: 2  Anesthesia Plan: General   Post-op Pain Management:    Induction: Intravenous  PONV Risk Score and Plan: 3 and Ondansetron , Dexamethasone , Midazolam  and Treatment may vary due to age or medical condition  Airway Management Planned: Oral ETT  Additional Equipment:   Intra-op Plan:   Post-operative Plan: Extubation in OR  Informed Consent: I have reviewed the patients History and Physical, chart, labs and discussed the procedure including the risks, benefits and alternatives for the proposed anesthesia with the patient or authorized representative who has indicated his/her understanding and acceptance.     Dental advisory given  Plan Discussed with: CRNA, Anesthesiologist and Surgeon  Anesthesia Plan Comments:         Anesthesia Quick Evaluation  "

## 2024-06-19 NOTE — Op Note (Unsigned)
 NAME: [name], [name] MEDICAL RECORD NO: [id number] ACCOUNT NO: [id number] DATE OF BIRTH: [birth year] FACILITY: [facility] LOCATION: [facility] PHYSICIAN: Donette Mainwaring A. Rutherford, MD  Operative Report   DATE OF PROCEDURE: 06/19/2024  PREOPERATIVE DIAGNOSES: 1. Menorrhagia. 2. Intramural, submucosal, and subserosal fibroids.  PROCEDURES PERFORMED: 1. Da Vinci robotic total hysterectomy. 2. Bilateral salpingectomy. 3. Robotic lysis of adhesiolysis. 4. Cystoscopy.  POSTOPERATIVE DIAGNOSES: 1. Menorrhagia. 2. Intramural, submucosal, and subserosal fibroids. 3. Severe abdominal pelvic adhesions.  SURGEON:  Dickie Rutherford, MD  ASSISTANT:  Randine Abbot, RNFA.  DESCRIPTION OF PROCEDURE:  Under adequate general anesthesia, the patient was placed in the dorsal lithotomy position.  She was positioned for robotic surgery.  Bimanual examination revealed a 13-week size uterus, no adnexal masses could be appreciated.   The patient was sterilely prepped and draped in the usual fashion, a three-way Foley catheter was sterilely placed.  A weighted speculum was placed in the vagina, a Sims retractor was placed anteriorly. And 0-Vicryl figure-of-eight sutures were placed on the  anterior and posterior lip of the cervix.  The uterus sounded to 11 cm.  Initially, a 10 mm uterine manipulator was introduced into the uterine cavity but did not sit well on the cervix.  Therefore it was decreased down to an 8 mm uterine manipulator  along with a small RUMI KOH cup.  Once this was done, retractors were removed, attention was then turned to back to the abdomen.  Supraumbilically, 0:25% Marcaine  was injected, a small incision was made, a Veress needle was introduced, tested with  sterile water.  Opening pressure of 7 was noted.  A total of 2.9 liters of CO2 was insufflated, the Veress needle was then removed.  The robotic trocar was introduced into the abdomen, a lighted robotic camera was introduced into that port  with entrance  to the abdomen without incident.  Panoramic inspection revealed a normal liver edge and it looks like a large fibroid uterus.  The additional port sites were then placed, 2 on the right, 9 cm apart.  One on the far left in the same line and introduced  into that segment was an 8 mm AirSeal placement.  All were then placed under direct visualization.  The patient had been placed in deep Trendelenburg position.  The robot was then docked.  In arm number 1 a Vessel sealer was placed, arm number 3 was the  long bipolar grasper and arm number 4 the monopolar scissors.  I then went to the surgical console.  At the surgical console, attention was then turned to the pelvis.  On initial entry into the abdomen with the probe, the ports, there were omental adhesions in the midline of the up to the anterior abdominal wall.  There was a portion of the bladder and  the lower uterine segment also attached to the lower anterior abdominal wall in the midline.  The procedure was started with using the Vessel sealer to clamp, cauterize and cut the omental adhesions off the anterior abdominal wall.  Then the inspection  posteriorly was notable for a large amount of adhesions.  The left tube was not seen, the left ovary was not seen at all, the posterior cul-de-sac was with adhesions.  On the right, the right ovary was also encased in adhesions.  There were periovarian  and peritubal adhesions in the adnexas bilaterally such that the procedure was started with lysis of these adhesions to try to restore the anatomy.  The ureter on the right was seen, the  left was not seen.  After the adhesiolysis had been performed on  the right, freeing up the right ovary, freeing up the right tube, which then allowed for serial clamping of the mesosalpinx with removal of the right tube.  The right retroperitoneal space was then opened anteriorly.  A piece of the serosa and bladder  reflection that was as stuck to the anterior  wall was also sharply was lysed as well.  The vesicouterine peritoneum was at that point opened transversely just above where the prior cesarean section scar was noted.  The bladder was sharply dissected off  the lower uterine segment and the RUMI cup and displaced inferiorly.  Attention was then went back to the right uterine vessels which were then skeletonized, the posteriorly for the broad ligament was sharply dissected and displaced more inferiorly.  The right utero-ovarian ligament was identified.  The opening in the  retroperitoneum, the broad ligament was made, the right utero-ovarian ligament was doubly clamped, cauterized and cut.  Facilitating the round ligament was then clamped, cauterized and cut and carried around to the vesicouterine peritoneum and opened  further on the right.  The uterine vessels were skeletonized, clamped, cauterized and cut.  On the opposite side, the periovarian, peritubal adhesions, the adhesions around the ovary was lysed.  The attempt to find the ureter at the pelvic brim was  unsuccessful.  There were bowel adhesions to the left anterior lower uterine segment that was lysed.  The periovarian adhesions were lysed allowing for the left tube to be seen.  It was then serially clamped, cauterized and cut till it was removed as  well.  Then finally the right ovary was separated out from the adhesions.  The left ovary was separated from the adhesions.  The left utero-ovarian ligament was finally identified with an opening made in the peritoneum, posterior peritoneum.  The left  utero-ovarian ligament was clamped, cauterized and cut.  The left round ligament was minimally identifiable.  It was grasped, clamped, cauterized and cut and continued anteriorly to the remaining vesicouterine peritoneum to be displaced inferiorly.   There was tenting of the left uterosacral ligament and this was sharply dissected and displaced inferiorly.  Tortuous vessels of the uterus was noted on  the left.  Once the bladder was further dissected off the lower uterine segment and the RUMI cup, the  left uterine vessels were the isolated, were skeletonized, clamped, cauterized and then ultimately cut.  The adhesiolysis of the adhesions itself to restore the anatomy took an hour.  The uterus appeared blanched.  The cervicovaginal junction at the  upper portion of the RUMI cup was identified.  It was then opened, the cup, the cervicovaginal junction was opened with a cautery, carried around circumferentially.  On the right more so than the left, the uterosacral ligament was dissected, the  cervicovaginal junction was reached above that, this was done circumferentially until the cervix was detached from the vagina.  Once this was done, the uterus was removed along with the cervix.  The vaginal insufflator was reinserted.  There was about a  centimeter rent in the posterior wall of the vagina on the upper portion of that vaginal cuff.  Bleeding was noted.  The instruments were switched out to the long-tip forceps and the Mega Suture needle driver.  Once the hemostasis is achieved of bleeding  along its edges, the vaginal cuff was closed with 0 V-Loc suture x 2.  This was done after the defect in the  posterior vaginal cuff was closed with 2-0 Vicryl interrupted figure-of-eight sutures.  The cuff itself was then again closed with sutures.  The  ureter was noted to be peristalsing on the right, the left despite multiple attempts was not seen.  Therefore, a decision was made to perform a cystoscopy.  I went back sterilely.  The urethra was prepped with Betadine  and the 70-degree angle cystoscope  was introduced.  Both ureteral jet sites were identified, they were seen briskly putting out urine.  At that point, the bladder was inspected, no lesions was noted.  The cystoscope was removed, the Foley was reinserted.  I went back to the surgical  console.  Additional bleeding was cauterized.  The pressure was  dropped to 8 and then back up to 15 after adequate hemostasis was noted throughout.  The abdomen was vigorously irrigated and suctioned of debris.  The Surgicel was used to irrigate the  pelvis, which was then subsequently removed.  The robot was undocked after the instruments were removed.  Good hemostasis noted.  The vaginal cuff was then digitally and visually inspected at the end of the case.  The incisions were then closed with 4-0  Vicryl sutures.  SPECIMEN:  Uterus with cervix, fallopian tubes weighing 331 g.  INTRAOPERATIVE FLUIDS:  1700 mL.  URINE OUTPUT:  250 mL.  ESTIMATED BLOOD LOSS:  25 mL.  COUNTS:  Sponge and instrument counts x 2 was correct.  COMPLICATIONS:  None.  CONDITION:  The patient tolerated the procedure well, was transferred to recovery in stable condition.   MUK D: 06/19/2024 10:24:23 pm T: 06/19/2024 10:51:00 pm  JOB: [id number]/ [id number]

## 2024-06-19 NOTE — Anesthesia Postprocedure Evaluation (Signed)
"   Anesthesia Post Note  Patient: Graeme JAYSON Bras  Procedure(s) Performed: HYSTERECTOMY, TOTAL, LAPAROSCOPIC, ROBOT-ASSISTED WITH BILATERAL  SALPINGECTOMY (Pelvis) LYSIS, ADHESIONS, ROBOT-ASSISTED, LAPAROSCOPIC (Abdomen) CYSTOSCOPY (Bladder)     Patient location during evaluation: PACU Anesthesia Type: General Level of consciousness: awake and alert Pain management: pain level controlled Vital Signs Assessment: post-procedure vital signs reviewed and stable Respiratory status: spontaneous breathing, nonlabored ventilation and respiratory function stable Cardiovascular status: blood pressure returned to baseline and stable Postop Assessment: no apparent nausea or vomiting Anesthetic complications: no   No notable events documented.                Quorra Rosene      "

## 2024-06-19 NOTE — Progress Notes (Signed)
 POD #0  Pt desires to go home Has met all milestones> ambulated in hall x 3, voided and ate Reviewed intra-op findings and postop of care Labs reviewed BP 123/80 (BP Location: Left Arm)   Pulse 81   Temp (!) 97.5 F (36.4 C) (Oral)   Resp 18   Ht 5' (1.524 m)   Wt 79.4 kg   LMP 06/16/2024   SpO2 99%   BMI 34.18 kg/m   F/u 2 wk D/c instructions reviewed

## 2024-06-19 NOTE — Brief Op Note (Signed)
 06/19/2024 11:22 AM  3:48 PM  PATIENT:  Laura [name]  49 y.o. female  PRE-OPERATIVE DIAGNOSIS:  Intramural, submucous, and subserous leiomyoma of uterus  menorrhagia  POST-OPERATIVE DIAGNOSIS:  Intramural, submucous, and subserous leiomyoma of uterus, menorrhagia,severe abdominopelvic adhesions  PROCEDURE:  Procedures: HYSTERECTOMY, TOTAL, LAPAROSCOPIC, ROBOT-ASSISTED WITH BILATERAL  SALPINGECTOMY (N/A) LYSIS, ADHESIONS, ROBOT-ASSISTED, LAPAROSCOPIC CYSTOSCOPY  SURGEON:  Surgeons and Role:    * Rutherford Gain, MD - Primary  PHYSICIAN ASSISTANT:   ASSISTANTS: Daine Abbot RNFA   ANESTHESIA:   general FINDINGS;  OMENTAL adhesions to anterior abdominal wall, multiple uterine fibroids, severe abdominopelvic adhesions EBL:  25 mL   BLOOD ADMINISTERED:none  DRAINS: none   LOCAL MEDICATIONS USED:  MARCAINE      SPECIMEN:  Source of Specimen:  uterus with cervix, tubes  DISPOSITION OF SPECIMEN:  PATHOLOGY  COUNTS:  YES  TOURNIQUET:  * No tourniquets in log *  DICTATION: .Other Dictation: Dictation Number [id number]  PLAN OF CARE: Admit for overnight observation  PATIENT DISPOSITION:  PACU - hemodynamically stable.   Delay start of Pharmacological VTE agent (>24hrs) due to surgical blood loss or risk of bleeding: no

## 2024-06-19 NOTE — H&P (Signed)
 Laura [name] is an 49 y.o. female. [name] MF presents for surgical mgmt of symptomatic uterine fibroids. Pt is scheduled for DaVinci robotic total hysterectomy, bilateral salpingectomy. W/u included benign endometrial biopsy, sonogram with multiple uterine fibroids. Pt request definitive therapy  Pertinent Gynecological History: Menses: menometrorrhagia Bleeding: dysfunctional uterine bleeding Contraception: none DES exposure: denies Blood transfusions: none Sexually transmitted diseases: no past history Previous GYN Procedures: DNC  Last mammogram: normal Date: 08/27/2023 Last pap: normal Date: 02/2024 OB History: G2, P1011   Menstrual History: Menarche age: n/a Patient's last menstrual period was 06/16/2024.    Past Medical History:  Diagnosis Date   ALLERGIC RHINITIS 06/01/2008   Anemia    Anxiety 08/15/2010   ASTHMA 06/01/2008   Asthma    BURSITIS, LEFT HIP 04/28/2010   Diabetes mellitus without complication (HCC)    GERD (gastroesophageal reflux disease)    GOITER, MULTINODULAR 06/28/2008   Hirsutism 06/02/2009   HYPERTENSION 06/01/2008   Morbid (severe) obesity due to excess calories (HCC) 04/18/2023   bmi 42.77   Palpitations 07/25/2010   PERIPHERAL EDEMA 04/28/2010   Pharyngitis, acute 10/09/2010   TONSILLITIS, RECURRENT 05/05/2009    Past Surgical History:  Procedure Laterality Date   bilateral turbiante reductions     CESAREAN SECTION  2007   DILATATION & CURETTAGE/HYSTEROSCOPY WITH MYOSURE N/A 07/22/2018   Procedure: DILATATION & CURETTAGE/HYSTEROSCOPY WITH MYOSURE XL;  Surgeon: Laura Gain, MD;  Location: [facility];  Service: Gynecology;  Laterality: N/A;   s/p sinus surgury  2006    Family History  Problem Relation Age of Onset   Hypertension Mother    Hyperlipidemia Mother    Hypertension Maternal Grandmother    Hyperlipidemia Maternal Grandmother    Hypertension Maternal Grandfather    Hyperlipidemia Maternal  Grandfather    Lung cancer Maternal Grandfather    Colon cancer Neg Hx    Colon polyps Neg Hx    Esophageal cancer Neg Hx    Rectal cancer Neg Hx    Stomach cancer Neg Hx     Social History:  reports that she has never smoked. She has never used smokeless tobacco. She reports that she does not drink alcohol and does not use drugs.  Allergies: Allergies[1]  No medications prior to admission.    Review of Systems  All other systems reviewed and are negative.   Height 5' (1.524 m), weight 79.8 kg, last menstrual period 06/16/2024. Physical Exam Constitutional:      Appearance: Normal appearance.  HENT:     Head: Atraumatic.  Eyes:     Extraocular Movements: Extraocular movements intact.  Cardiovascular:     Rate and Rhythm: Regular rhythm.     Heart sounds: Normal heart sounds.  Pulmonary:     Breath sounds: Normal breath sounds.  Abdominal:     Palpations: Abdomen is soft.     Comments: Pfannenstiel scar  Genitourinary:    General: Normal vulva.     Comments: Vagina  nl Cervix nulliparous Uterus AV ( 13 wk size) Adnexa nl Musculoskeletal:     Cervical back: Neck supple.  Skin:    General: Skin is warm and dry.  Neurological:     General: No focal deficit present.     Mental Status: She is alert and oriented to person, place, and time.  Psychiatric:        Mood and Affect: Mood normal.        Behavior: Behavior normal.     No results found for this  or any previous visit (from the past 24 hours).  No results found.  Assessment/Plan: Menometrorrhagia IM/SM/SS fibroids  Chronic HTN P) da Vinci robotic total hysterectomy, bilateral salpingectomy. Procedure explained. Risk of surgery reviewed including infection, bleeding, possible need for blood transfusion and its risk, thermal injury, injury to bladder, bowel, ureter, internal scar tissue, possible need for ovarian surgery in the future due to pain, cyst, ovarian cancer. All? answered  Laura [name] A  Laura [name] 06/19/2024, 4:37 AM     [1] No Known Allergies

## 2024-06-19 NOTE — Transfer of Care (Signed)
 Immediate Anesthesia Transfer of Care Note  Patient: Laura [name]  Procedure(s) Performed: HYSTERECTOMY, TOTAL, LAPAROSCOPIC, ROBOT-ASSISTED WITH BILATERAL  SALPINGECTOMY (Pelvis) LYSIS, ADHESIONS, ROBOT-ASSISTED, LAPAROSCOPIC (Abdomen) CYSTOSCOPY (Bladder)  Patient Location: PACU  Anesthesia Type:General  Level of Consciousness: drowsy  Airway & Oxygen Therapy: Patient Spontanous Breathing and Patient connected to face mask oxygen  Post-op Assessment: Report given to RN and Post -op Vital signs reviewed and stable  Post vital signs: Reviewed and stable  Last Vitals:  Vitals Value Taken Time  BP 130/86 06/19/24 16:16  Temp    Pulse 84 06/19/24 16:23  Resp 18 06/19/24 16:23  SpO2 100 % 06/19/24 16:23  Vitals shown include unfiled device data.  Last Pain:  Vitals:   06/19/24 1011  TempSrc: Oral  PainSc: 0-No pain      Patients Stated Pain Goal: 7 (06/19/24 1011)  Complications: No notable events documented.

## 2024-06-19 NOTE — Interval H&P Note (Signed)
 History and Physical Interval Note:  06/19/2024 11:25 AM  Laura [name]  has presented today for surgery, with the diagnosis of Intramural, submucous, and subserous leiomyoma of uterus  Abnormal uterine bleeding.  The various methods of treatment have been discussed with the patient and family. After consideration of risks, benefits and other options for treatment, the patient has consented to  Procedures: HYSTERECTOMY, TOTAL, LAPAROSCOPIC, ROBOT-ASSISTED WITH SALPINGECTOMY (N/A) as a surgical intervention.  The patient's history has been reviewed, patient examined, no change in status, stable for surgery.  I have reviewed the patient's chart and labs.  Questions were answered to the patient's satisfaction.     Lajuana Patchell A Lisle Skillman

## 2024-06-20 ENCOUNTER — Encounter (HOSPITAL_COMMUNITY): Payer: Self-pay | Admitting: Obstetrics and Gynecology

## 2024-06-23 LAB — SURGICAL PATHOLOGY

## 2024-08-31 ENCOUNTER — Ambulatory Visit: Admitting: "Endocrinology

## 2024-09-24 ENCOUNTER — Encounter: Admitting: Family Medicine
